# Patient Record
Sex: Female | Born: 1965
Health system: Southern US, Community
[De-identification: ages and names within clinical notes are randomized; demographics above are authoritative.]

## PROBLEM LIST (undated history)

## (undated) DIAGNOSIS — T7840XA Allergy, unspecified, initial encounter: Secondary | ICD-10-CM

## (undated) DIAGNOSIS — F411 Generalized anxiety disorder: Secondary | ICD-10-CM

## (undated) DIAGNOSIS — R5383 Other fatigue: Secondary | ICD-10-CM

## (undated) DIAGNOSIS — I10 Essential (primary) hypertension: Secondary | ICD-10-CM

## (undated) DIAGNOSIS — K589 Irritable bowel syndrome without diarrhea: Secondary | ICD-10-CM

## (undated) DIAGNOSIS — K219 Gastro-esophageal reflux disease without esophagitis: Secondary | ICD-10-CM

## (undated) DIAGNOSIS — F329 Major depressive disorder, single episode, unspecified: Secondary | ICD-10-CM

## (undated) DIAGNOSIS — G43909 Migraine, unspecified, not intractable, without status migrainosus: Secondary | ICD-10-CM

## (undated) DIAGNOSIS — H6690 Otitis media, unspecified, unspecified ear: Secondary | ICD-10-CM

## (undated) DIAGNOSIS — E785 Hyperlipidemia, unspecified: Secondary | ICD-10-CM

## (undated) DIAGNOSIS — R079 Chest pain, unspecified: Secondary | ICD-10-CM

## (undated) DIAGNOSIS — K559 Vascular disorder of intestine, unspecified: Secondary | ICD-10-CM

## (undated) DIAGNOSIS — H01009 Unspecified blepharitis unspecified eye, unspecified eyelid: Secondary | ICD-10-CM

## (undated) DIAGNOSIS — G709 Myoneural disorder, unspecified: Secondary | ICD-10-CM

## (undated) DIAGNOSIS — R5381 Other malaise: Secondary | ICD-10-CM

## (undated) DIAGNOSIS — G4710 Hypersomnia, unspecified: Secondary | ICD-10-CM

## (undated) DIAGNOSIS — E559 Vitamin D deficiency, unspecified: Secondary | ICD-10-CM

## (undated) DIAGNOSIS — R011 Cardiac murmur, unspecified: Secondary | ICD-10-CM

## (undated) DIAGNOSIS — D649 Anemia, unspecified: Secondary | ICD-10-CM

## (undated) DIAGNOSIS — K625 Hemorrhage of anus and rectum: Secondary | ICD-10-CM

## (undated) DIAGNOSIS — J309 Allergic rhinitis, unspecified: Secondary | ICD-10-CM

## (undated) DIAGNOSIS — J019 Acute sinusitis, unspecified: Secondary | ICD-10-CM

## (undated) HISTORY — DX: Allergy, unspecified, initial encounter: T78.40XA

## (undated) HISTORY — DX: Otitis media, unspecified, unspecified ear: H66.90

## (undated) HISTORY — DX: Anemia, unspecified: D64.9

## (undated) HISTORY — DX: Irritable bowel syndrome without diarrhea: K58.9

## (undated) HISTORY — DX: Gastro-esophageal reflux disease without esophagitis: K21.9

## (undated) HISTORY — DX: Other malaise: R53.81

## (undated) HISTORY — DX: Chest pain, unspecified: R07.9

## (undated) HISTORY — DX: Cardiac murmur, unspecified: R01.1

## (undated) HISTORY — DX: Acute sinusitis, unspecified: J01.90

## (undated) HISTORY — DX: Essential (primary) hypertension: I10

## (undated) HISTORY — DX: Vascular disorder of intestine, unspecified: K55.9

## (undated) HISTORY — PX: COLONOSCOPY: SHX174

## (undated) HISTORY — DX: Hypersomnia, unspecified: G47.10

## (undated) HISTORY — DX: Allergic rhinitis, unspecified: J30.9

## (undated) HISTORY — DX: Unspecified blepharitis unspecified eye, unspecified eyelid: H01.009

## (undated) HISTORY — DX: Hyperlipidemia, unspecified: E78.5

## (undated) HISTORY — DX: Major depressive disorder, single episode, unspecified: F32.9

## (undated) HISTORY — DX: Vitamin D deficiency, unspecified: E55.9

## (undated) HISTORY — DX: Myoneural disorder, unspecified: G70.9

## (undated) HISTORY — DX: Hemorrhage of anus and rectum: K62.5

## (undated) HISTORY — DX: Other fatigue: R53.83

## (undated) HISTORY — DX: Generalized anxiety disorder: F41.1

## (undated) HISTORY — DX: Migraine, unspecified, not intractable, without status migrainosus: G43.909

---

## 1999-12-07 ENCOUNTER — Encounter: Payer: Self-pay | Admitting: Internal Medicine

## 1999-12-07 ENCOUNTER — Encounter: Admission: RE | Admit: 1999-12-07 | Discharge: 1999-12-07 | Payer: Self-pay | Admitting: Internal Medicine

## 2000-11-17 ENCOUNTER — Other Ambulatory Visit: Admission: RE | Admit: 2000-11-17 | Discharge: 2000-11-17 | Payer: Self-pay | Admitting: Obstetrics and Gynecology

## 2002-02-08 ENCOUNTER — Encounter: Payer: Self-pay | Admitting: Family Medicine

## 2002-02-08 ENCOUNTER — Encounter: Admission: RE | Admit: 2002-02-08 | Discharge: 2002-02-08 | Payer: Self-pay | Admitting: Family Medicine

## 2002-08-01 ENCOUNTER — Encounter: Admission: RE | Admit: 2002-08-01 | Discharge: 2002-08-01 | Payer: Self-pay | Admitting: Obstetrics and Gynecology

## 2002-08-01 ENCOUNTER — Encounter: Payer: Self-pay | Admitting: Obstetrics and Gynecology

## 2003-01-23 ENCOUNTER — Encounter: Payer: Self-pay | Admitting: Family Medicine

## 2003-01-23 ENCOUNTER — Encounter: Admission: RE | Admit: 2003-01-23 | Discharge: 2003-01-23 | Payer: Self-pay | Admitting: Family Medicine

## 2003-03-21 ENCOUNTER — Encounter: Admission: RE | Admit: 2003-03-21 | Discharge: 2003-03-21 | Payer: Self-pay | Admitting: Family Medicine

## 2003-03-21 ENCOUNTER — Encounter: Payer: Self-pay | Admitting: Family Medicine

## 2003-06-07 ENCOUNTER — Other Ambulatory Visit: Admission: RE | Admit: 2003-06-07 | Discharge: 2003-06-07 | Payer: Self-pay | Admitting: Obstetrics and Gynecology

## 2003-11-23 HISTORY — PX: BREAST BIOPSY: SHX20

## 2004-01-24 ENCOUNTER — Encounter: Admission: RE | Admit: 2004-01-24 | Discharge: 2004-01-24 | Payer: Self-pay | Admitting: Obstetrics and Gynecology

## 2004-01-24 ENCOUNTER — Other Ambulatory Visit: Admission: RE | Admit: 2004-01-24 | Discharge: 2004-01-24 | Payer: Self-pay | Admitting: Obstetrics and Gynecology

## 2004-01-24 ENCOUNTER — Encounter (INDEPENDENT_AMBULATORY_CARE_PROVIDER_SITE_OTHER): Payer: Self-pay | Admitting: Specialist

## 2004-03-02 ENCOUNTER — Ambulatory Visit (HOSPITAL_COMMUNITY): Admission: RE | Admit: 2004-03-02 | Discharge: 2004-03-02 | Payer: Self-pay | Admitting: General Surgery

## 2004-03-02 ENCOUNTER — Ambulatory Visit (HOSPITAL_BASED_OUTPATIENT_CLINIC_OR_DEPARTMENT_OTHER): Admission: RE | Admit: 2004-03-02 | Discharge: 2004-03-02 | Payer: Self-pay | Admitting: General Surgery

## 2004-03-02 ENCOUNTER — Encounter (INDEPENDENT_AMBULATORY_CARE_PROVIDER_SITE_OTHER): Payer: Self-pay | Admitting: *Deleted

## 2005-01-28 ENCOUNTER — Other Ambulatory Visit: Admission: RE | Admit: 2005-01-28 | Discharge: 2005-01-28 | Payer: Self-pay | Admitting: Obstetrics and Gynecology

## 2005-10-29 ENCOUNTER — Encounter: Admission: RE | Admit: 2005-10-29 | Discharge: 2005-10-29 | Payer: Self-pay | Admitting: Obstetrics and Gynecology

## 2005-11-23 ENCOUNTER — Encounter: Admission: RE | Admit: 2005-11-23 | Discharge: 2005-11-23 | Payer: Self-pay | Admitting: Obstetrics and Gynecology

## 2006-02-10 ENCOUNTER — Emergency Department (HOSPITAL_COMMUNITY): Admission: EM | Admit: 2006-02-10 | Discharge: 2006-02-10 | Payer: Self-pay | Admitting: Emergency Medicine

## 2006-09-29 ENCOUNTER — Other Ambulatory Visit: Admission: RE | Admit: 2006-09-29 | Discharge: 2006-09-29 | Payer: Self-pay | Admitting: Obstetrics and Gynecology

## 2006-10-05 ENCOUNTER — Ambulatory Visit: Payer: Self-pay | Admitting: Internal Medicine

## 2006-12-27 ENCOUNTER — Ambulatory Visit: Payer: Self-pay | Admitting: Internal Medicine

## 2007-02-06 ENCOUNTER — Ambulatory Visit: Payer: Self-pay | Admitting: Internal Medicine

## 2007-03-07 ENCOUNTER — Ambulatory Visit: Payer: Self-pay | Admitting: Internal Medicine

## 2007-03-13 ENCOUNTER — Ambulatory Visit: Payer: Self-pay | Admitting: Internal Medicine

## 2007-04-05 ENCOUNTER — Ambulatory Visit: Payer: Self-pay | Admitting: Internal Medicine

## 2007-04-12 ENCOUNTER — Ambulatory Visit: Payer: Self-pay | Admitting: Internal Medicine

## 2007-06-27 ENCOUNTER — Ambulatory Visit: Payer: Self-pay | Admitting: Internal Medicine

## 2007-06-27 DIAGNOSIS — I10 Essential (primary) hypertension: Secondary | ICD-10-CM

## 2007-06-27 HISTORY — DX: Essential (primary) hypertension: I10

## 2007-06-27 LAB — CONVERTED CEMR LAB
Bilirubin Urine: NEGATIVE
Hemoglobin, Urine: NEGATIVE
Ketones, ur: NEGATIVE mg/dL
Leukocytes, UA: NEGATIVE
Nitrite: NEGATIVE
Total Protein, Urine: NEGATIVE mg/dL
Urine Glucose: NEGATIVE mg/dL
Urobilinogen, UA: 0.2 (ref 0.0–1.0)
pH: 6.5 (ref 5.0–8.0)

## 2007-09-08 ENCOUNTER — Telehealth (INDEPENDENT_AMBULATORY_CARE_PROVIDER_SITE_OTHER): Payer: Self-pay | Admitting: *Deleted

## 2007-12-04 ENCOUNTER — Telehealth (INDEPENDENT_AMBULATORY_CARE_PROVIDER_SITE_OTHER): Payer: Self-pay | Admitting: *Deleted

## 2007-12-15 ENCOUNTER — Ambulatory Visit: Payer: Self-pay | Admitting: Internal Medicine

## 2007-12-15 DIAGNOSIS — F329 Major depressive disorder, single episode, unspecified: Secondary | ICD-10-CM

## 2007-12-15 DIAGNOSIS — F411 Generalized anxiety disorder: Secondary | ICD-10-CM | POA: Insufficient documentation

## 2007-12-15 DIAGNOSIS — J309 Allergic rhinitis, unspecified: Secondary | ICD-10-CM

## 2007-12-15 DIAGNOSIS — F32A Depression, unspecified: Secondary | ICD-10-CM | POA: Insufficient documentation

## 2007-12-15 DIAGNOSIS — K219 Gastro-esophageal reflux disease without esophagitis: Secondary | ICD-10-CM | POA: Insufficient documentation

## 2007-12-15 DIAGNOSIS — F3289 Other specified depressive episodes: Secondary | ICD-10-CM

## 2007-12-15 HISTORY — DX: Allergic rhinitis, unspecified: J30.9

## 2007-12-15 HISTORY — DX: Gastro-esophageal reflux disease without esophagitis: K21.9

## 2007-12-15 HISTORY — DX: Major depressive disorder, single episode, unspecified: F32.9

## 2007-12-15 HISTORY — DX: Other specified depressive episodes: F32.89

## 2007-12-15 HISTORY — DX: Generalized anxiety disorder: F41.1

## 2007-12-18 ENCOUNTER — Telehealth (INDEPENDENT_AMBULATORY_CARE_PROVIDER_SITE_OTHER): Payer: Self-pay | Admitting: *Deleted

## 2007-12-19 ENCOUNTER — Telehealth (INDEPENDENT_AMBULATORY_CARE_PROVIDER_SITE_OTHER): Payer: Self-pay | Admitting: *Deleted

## 2007-12-24 HISTORY — PX: UPPER GASTROINTESTINAL ENDOSCOPY: SHX188

## 2007-12-27 ENCOUNTER — Ambulatory Visit: Payer: Self-pay | Admitting: Gastroenterology

## 2007-12-28 ENCOUNTER — Ambulatory Visit: Payer: Self-pay | Admitting: Gastroenterology

## 2007-12-30 ENCOUNTER — Ambulatory Visit: Payer: Self-pay | Admitting: Family Medicine

## 2007-12-30 DIAGNOSIS — J019 Acute sinusitis, unspecified: Secondary | ICD-10-CM | POA: Insufficient documentation

## 2007-12-30 HISTORY — DX: Acute sinusitis, unspecified: J01.90

## 2008-01-01 ENCOUNTER — Telehealth (INDEPENDENT_AMBULATORY_CARE_PROVIDER_SITE_OTHER): Payer: Self-pay | Admitting: *Deleted

## 2008-01-02 ENCOUNTER — Encounter: Payer: Self-pay | Admitting: Internal Medicine

## 2008-01-02 ENCOUNTER — Ambulatory Visit: Payer: Self-pay | Admitting: Gastroenterology

## 2008-01-16 ENCOUNTER — Ambulatory Visit: Payer: Self-pay | Admitting: Internal Medicine

## 2008-01-23 ENCOUNTER — Ambulatory Visit: Payer: Self-pay | Admitting: Gastroenterology

## 2008-04-03 ENCOUNTER — Ambulatory Visit: Payer: Self-pay | Admitting: Internal Medicine

## 2008-04-03 DIAGNOSIS — H01009 Unspecified blepharitis unspecified eye, unspecified eyelid: Secondary | ICD-10-CM

## 2008-04-03 HISTORY — DX: Unspecified blepharitis unspecified eye, unspecified eyelid: H01.009

## 2008-04-09 ENCOUNTER — Telehealth (INDEPENDENT_AMBULATORY_CARE_PROVIDER_SITE_OTHER): Payer: Self-pay | Admitting: *Deleted

## 2008-04-10 ENCOUNTER — Telehealth (INDEPENDENT_AMBULATORY_CARE_PROVIDER_SITE_OTHER): Payer: Self-pay | Admitting: *Deleted

## 2008-04-10 ENCOUNTER — Encounter: Payer: Self-pay | Admitting: Internal Medicine

## 2008-04-25 ENCOUNTER — Encounter: Admission: RE | Admit: 2008-04-25 | Discharge: 2008-04-25 | Payer: Self-pay | Admitting: Obstetrics and Gynecology

## 2008-09-12 ENCOUNTER — Ambulatory Visit: Payer: Self-pay | Admitting: Internal Medicine

## 2008-09-12 DIAGNOSIS — H6690 Otitis media, unspecified, unspecified ear: Secondary | ICD-10-CM | POA: Insufficient documentation

## 2008-09-12 HISTORY — DX: Otitis media, unspecified, unspecified ear: H66.90

## 2008-09-16 ENCOUNTER — Telehealth (INDEPENDENT_AMBULATORY_CARE_PROVIDER_SITE_OTHER): Payer: Self-pay | Admitting: *Deleted

## 2008-12-23 ENCOUNTER — Ambulatory Visit: Payer: Self-pay | Admitting: Internal Medicine

## 2008-12-23 DIAGNOSIS — G43009 Migraine without aura, not intractable, without status migrainosus: Secondary | ICD-10-CM | POA: Insufficient documentation

## 2008-12-23 DIAGNOSIS — G43909 Migraine, unspecified, not intractable, without status migrainosus: Secondary | ICD-10-CM

## 2008-12-23 HISTORY — DX: Migraine, unspecified, not intractable, without status migrainosus: G43.909

## 2009-01-13 ENCOUNTER — Telehealth (INDEPENDENT_AMBULATORY_CARE_PROVIDER_SITE_OTHER): Payer: Self-pay | Admitting: *Deleted

## 2009-01-14 ENCOUNTER — Telehealth (INDEPENDENT_AMBULATORY_CARE_PROVIDER_SITE_OTHER): Payer: Self-pay | Admitting: *Deleted

## 2009-01-27 ENCOUNTER — Telehealth (INDEPENDENT_AMBULATORY_CARE_PROVIDER_SITE_OTHER): Payer: Self-pay | Admitting: *Deleted

## 2009-02-12 ENCOUNTER — Encounter: Admission: RE | Admit: 2009-02-12 | Discharge: 2009-02-12 | Payer: Self-pay | Admitting: Otolaryngology

## 2009-04-28 ENCOUNTER — Telehealth: Payer: Self-pay | Admitting: Gastroenterology

## 2009-04-28 ENCOUNTER — Ambulatory Visit: Payer: Self-pay | Admitting: Gastroenterology

## 2009-04-28 DIAGNOSIS — K559 Vascular disorder of intestine, unspecified: Secondary | ICD-10-CM

## 2009-04-28 DIAGNOSIS — K625 Hemorrhage of anus and rectum: Secondary | ICD-10-CM | POA: Insufficient documentation

## 2009-04-28 DIAGNOSIS — K589 Irritable bowel syndrome without diarrhea: Secondary | ICD-10-CM | POA: Insufficient documentation

## 2009-04-28 HISTORY — DX: Hemorrhage of anus and rectum: K62.5

## 2009-04-28 HISTORY — DX: Irritable bowel syndrome, unspecified: K58.9

## 2009-04-28 HISTORY — DX: Vascular disorder of intestine, unspecified: K55.9

## 2009-04-30 LAB — CONVERTED CEMR LAB
ALT: 14 units/L (ref 0–35)
AST: 17 units/L (ref 0–37)
Albumin: 3.3 g/dL — ABNORMAL LOW (ref 3.5–5.2)
Alkaline Phosphatase: 112 units/L (ref 39–117)
Basophils Absolute: 0 10*3/uL (ref 0.0–0.1)
Calcium: 8.7 mg/dL (ref 8.4–10.5)
Chloride: 111 meq/L (ref 96–112)
Eosinophils Absolute: 0 10*3/uL (ref 0.0–0.7)
Lymphocytes Relative: 37.2 % (ref 12.0–46.0)
MCHC: 33.5 g/dL (ref 30.0–36.0)
Monocytes Absolute: 0.5 10*3/uL (ref 0.1–1.0)
Neutrophils Relative %: 55 % (ref 43.0–77.0)
Platelets: 258 10*3/uL (ref 150.0–400.0)
Potassium: 3.9 meq/L (ref 3.5–5.1)
RDW: 16.5 % — ABNORMAL HIGH (ref 11.5–14.6)

## 2009-05-02 ENCOUNTER — Encounter (INDEPENDENT_AMBULATORY_CARE_PROVIDER_SITE_OTHER): Payer: Self-pay | Admitting: *Deleted

## 2009-05-02 ENCOUNTER — Encounter: Payer: Self-pay | Admitting: Gastroenterology

## 2009-05-02 ENCOUNTER — Ambulatory Visit: Payer: Self-pay | Admitting: Gastroenterology

## 2009-05-02 LAB — HM COLONOSCOPY

## 2009-05-07 ENCOUNTER — Telehealth: Payer: Self-pay | Admitting: Gastroenterology

## 2009-05-07 ENCOUNTER — Encounter: Payer: Self-pay | Admitting: Gastroenterology

## 2009-05-08 ENCOUNTER — Telehealth: Payer: Self-pay | Admitting: Gastroenterology

## 2009-05-21 ENCOUNTER — Ambulatory Visit: Payer: Self-pay | Admitting: Internal Medicine

## 2009-05-28 ENCOUNTER — Ambulatory Visit: Payer: Self-pay | Admitting: Internal Medicine

## 2009-05-28 DIAGNOSIS — M25519 Pain in unspecified shoulder: Secondary | ICD-10-CM | POA: Insufficient documentation

## 2009-07-08 ENCOUNTER — Ambulatory Visit: Payer: Self-pay | Admitting: Gastroenterology

## 2009-08-05 ENCOUNTER — Telehealth: Payer: Self-pay | Admitting: Internal Medicine

## 2009-09-09 ENCOUNTER — Telehealth: Payer: Self-pay | Admitting: Internal Medicine

## 2009-12-01 ENCOUNTER — Ambulatory Visit: Payer: Self-pay | Admitting: Internal Medicine

## 2009-12-19 ENCOUNTER — Ambulatory Visit: Payer: Self-pay | Admitting: Internal Medicine

## 2010-01-22 ENCOUNTER — Encounter: Admission: RE | Admit: 2010-01-22 | Discharge: 2010-01-22 | Payer: Self-pay | Admitting: Obstetrics and Gynecology

## 2010-02-17 ENCOUNTER — Ambulatory Visit: Payer: Self-pay | Admitting: Internal Medicine

## 2010-03-30 ENCOUNTER — Ambulatory Visit: Payer: Self-pay | Admitting: Internal Medicine

## 2010-03-30 DIAGNOSIS — H1030 Unspecified acute conjunctivitis, unspecified eye: Secondary | ICD-10-CM | POA: Insufficient documentation

## 2010-04-10 ENCOUNTER — Ambulatory Visit: Payer: Self-pay | Admitting: Internal Medicine

## 2010-06-25 ENCOUNTER — Ambulatory Visit: Payer: Self-pay | Admitting: Internal Medicine

## 2010-06-29 ENCOUNTER — Telehealth: Payer: Self-pay | Admitting: Internal Medicine

## 2010-06-29 LAB — CONVERTED CEMR LAB
ALT: 13 units/L (ref 0–35)
BUN: 8 mg/dL (ref 6–23)
Basophils Absolute: 0.1 10*3/uL (ref 0.0–0.1)
Bilirubin Urine: NEGATIVE
Bilirubin, Direct: 0.1 mg/dL (ref 0.0–0.3)
Chloride: 105 meq/L (ref 96–112)
Cholesterol: 259 mg/dL — ABNORMAL HIGH (ref 0–200)
Creatinine, Ser: 0.7 mg/dL (ref 0.4–1.2)
Eosinophils Absolute: 0.1 10*3/uL (ref 0.0–0.7)
Eosinophils Relative: 1 % (ref 0.0–5.0)
GFR calc non Af Amer: 114.59 mL/min (ref >60)
Glucose, Bld: 82 mg/dL (ref 70–99)
HCT: 34.7 % — ABNORMAL LOW (ref 36.0–46.0)
HDL: 41.2 mg/dL (ref >39.00)
Hemoglobin, Urine: NEGATIVE
Ketones, ur: NEGATIVE mg/dL
Leukocytes, UA: NEGATIVE
Lymphs Abs: 3.6 10*3/uL (ref 0.7–4.0)
MCV: 76.7 fL — ABNORMAL LOW (ref 78.0–100.0)
Monocytes Absolute: 0.5 10*3/uL (ref 0.1–1.0)
Neutrophils Relative %: 45.5 % (ref 43.0–77.0)
Platelets: 282 10*3/uL (ref 150.0–400.0)
RDW: 16.6 % — ABNORMAL HIGH (ref 11.5–14.6)
Specific Gravity, Urine: 1.015 (ref 1.000–1.030)
TSH: 1.92 microintl units/mL (ref 0.35–5.50)
Total Bilirubin: 0.4 mg/dL (ref 0.3–1.2)
Total CHOL/HDL Ratio: 6
Triglycerides: 110 mg/dL (ref 0.0–149.0)
Urine Glucose: NEGATIVE mg/dL
Urobilinogen, UA: 0.2 (ref 0.0–1.0)
VLDL: 22 mg/dL (ref 0.0–40.0)
WBC: 7.9 10*3/uL (ref 4.5–10.5)

## 2010-07-03 ENCOUNTER — Ambulatory Visit: Payer: Self-pay | Admitting: Internal Medicine

## 2010-07-03 DIAGNOSIS — E785 Hyperlipidemia, unspecified: Secondary | ICD-10-CM

## 2010-07-03 DIAGNOSIS — M79609 Pain in unspecified limb: Secondary | ICD-10-CM | POA: Insufficient documentation

## 2010-07-03 HISTORY — DX: Hyperlipidemia, unspecified: E78.5

## 2010-08-05 ENCOUNTER — Telehealth: Payer: Self-pay | Admitting: Internal Medicine

## 2010-08-20 ENCOUNTER — Telehealth (INDEPENDENT_AMBULATORY_CARE_PROVIDER_SITE_OTHER): Payer: Self-pay

## 2010-08-25 ENCOUNTER — Ambulatory Visit: Payer: Self-pay | Admitting: Internal Medicine

## 2010-08-25 ENCOUNTER — Encounter: Payer: Self-pay | Admitting: Internal Medicine

## 2010-08-25 DIAGNOSIS — R5381 Other malaise: Secondary | ICD-10-CM

## 2010-08-25 DIAGNOSIS — R079 Chest pain, unspecified: Secondary | ICD-10-CM

## 2010-08-25 DIAGNOSIS — R5383 Other fatigue: Secondary | ICD-10-CM

## 2010-08-25 HISTORY — DX: Chest pain, unspecified: R07.9

## 2010-08-25 HISTORY — DX: Other malaise: R53.81

## 2010-08-25 LAB — CONVERTED CEMR LAB
Alkaline Phosphatase: 121 units/L — ABNORMAL HIGH (ref 39–117)
Bilirubin, Direct: 0.1 mg/dL (ref 0.0–0.3)
Cholesterol: 166 mg/dL (ref 0–200)
LDL Cholesterol: 98 mg/dL (ref 0–99)
Total CHOL/HDL Ratio: 3
Total Protein: 7.2 g/dL (ref 6.0–8.3)
VLDL: 18.8 mg/dL (ref 0.0–40.0)

## 2010-12-18 ENCOUNTER — Ambulatory Visit
Admission: RE | Admit: 2010-12-18 | Discharge: 2010-12-18 | Payer: Self-pay | Source: Home / Self Care | Attending: Internal Medicine | Admitting: Internal Medicine

## 2010-12-18 ENCOUNTER — Encounter (INDEPENDENT_AMBULATORY_CARE_PROVIDER_SITE_OTHER): Payer: Self-pay | Admitting: *Deleted

## 2010-12-18 ENCOUNTER — Other Ambulatory Visit: Payer: Self-pay | Admitting: Internal Medicine

## 2010-12-18 DIAGNOSIS — G4710 Hypersomnia, unspecified: Secondary | ICD-10-CM

## 2010-12-18 DIAGNOSIS — D649 Anemia, unspecified: Secondary | ICD-10-CM | POA: Insufficient documentation

## 2010-12-18 HISTORY — DX: Anemia, unspecified: D64.9

## 2010-12-18 HISTORY — DX: Hypersomnia, unspecified: G47.10

## 2010-12-18 LAB — CBC WITH DIFFERENTIAL/PLATELET
Basophils Absolute: 0.1 10*3/uL (ref 0.0–0.1)
Eosinophils Absolute: 0.1 10*3/uL (ref 0.0–0.7)
Hemoglobin: 11.2 g/dL — ABNORMAL LOW (ref 12.0–15.0)
Lymphocytes Relative: 42.6 % (ref 12.0–46.0)
MCHC: 32.3 g/dL (ref 30.0–36.0)
Neutro Abs: 4.5 10*3/uL (ref 1.4–7.7)
RDW: 16.5 % — ABNORMAL HIGH (ref 11.5–14.6)

## 2010-12-18 LAB — IBC PANEL
Iron: 52 ug/dL (ref 42–145)
Transferrin: 352.6 mg/dL (ref 212.0–360.0)

## 2010-12-24 NOTE — Assessment & Plan Note (Signed)
Summary: SINUS/NWS   Vital Signs:  Patient profile:   45 year old female Height:      54 inches Weight:      163 pounds BMI:     39.44 O2 Sat:      99 % on Room air Temp:     99.1 degrees F oral Pulse rate:   88 / minute BP sitting:   152 / 96  (left arm) Cuff size:   regular  Vitals Entered ByMarland Kitchen Zella Ball Ewing (December 01, 2009 9:37 AM)  O2 Flow:  Room air CC: sinus pressure and congestion/RE   Primary Care Provider:  Oliver Barre, MD  CC:  sinus pressure and congestion/RE.  History of Present Illness: here with 3 days acute onset facial pain, pressure, fever and congestion; has some mild ST, but Pt denies CP, sob, doe, wheezing, orthopnea, pnd, worsening LE edema, palps, dizziness or syncope   Pt denies new neuro symptoms such as headache, facial or extremity weakness   Recently started on allergy med - allegra , good compliance, and tolerating meds well.  No earache or vertigo.    Problems Prior to Update: 1)  Shoulder Pain, Right  (ICD-719.41) 2)  Irritable Bowel Syndrome  (ICD-564.1) 3)  Gerd  (ICD-530.81) 4)  Rectal Bleeding  (ICD-569.3) 5)  Rectal Bleeding  (ICD-569.3) 6)  Ischemic Colitis  (ICD-557.9) 7)  Common Migraine  (ICD-346.10) 8)  Sinusitis- Acute-nos  (ICD-461.9) 9)  Otitis Media, Bilateral  (ICD-382.9) 10)  Blepharitis, Right  (ICD-373.00) 11)  Acute Sinusitis, Unspecified  (ICD-461.9) 12)  Allergic Rhinitis  (ICD-477.9) 13)  Depression  (ICD-311) 14)  Anxiety  (ICD-300.00) 15)  Gerd  (ICD-530.81) 16)  Hypertension  (ICD-401.9)  Medications Prior to Update: 1)  Lisinopril 10 Mg Tabs (Lisinopril) .... Take 1 Tablet By Mouth Once A Day 2)  Nexium 40 Mg  Cpdr (Esomeprazole Magnesium) .Marland Kitchen.. 1 By Mouth Qd 3)  Nasacort Aq 55 Mcg/act  Aers (Triamcinolone Acetonide(Nasal)) .Marland Kitchen.. 1 Spray Each Nostril 1 Qd 4)  Zyrtec Allergy 10 Mg  Tabs (Cetirizine Hcl) .Marland Kitchen.. 1 By Mouth Once Daily Prn 5)  Transderm-Scop 1.5 Mg Pt72 (Scopolamine Base) .... Use Asd 1 Patch Q 3 Days As  Needed  Current Medications (verified): 1)  Lisinopril 10 Mg Tabs (Lisinopril) .... Take 1 Tablet By Mouth Once A Day 2)  Nexium 40 Mg  Cpdr (Esomeprazole Magnesium) .Marland Kitchen.. 1 By Mouth Qd 3)  Nasacort Aq 55 Mcg/act  Aers (Triamcinolone Acetonide(Nasal)) .Marland Kitchen.. 1 Spray Each Nostril 1 Qd 4)  Zyrtec Allergy 10 Mg  Tabs (Cetirizine Hcl) .Marland Kitchen.. 1 By Mouth Once Daily Prn 5)  Transderm-Scop 1.5 Mg Pt72 (Scopolamine Base) .... Use Asd 1 Patch Q 3 Days As Needed 6)  Fexofenadine Hcl 180 Mg Tabs (Fexofenadine Hcl) .Marland Kitchen.. 1 By Mouth Once Daily 7)  Cephalexin 500 Mg Caps (Cephalexin) .Marland Kitchen.. 1 By Mouth Three Times A Day  Allergies (verified): 1)  Biaxin 2)  Augmentin 3)  Doxycycline  Past History:  Past Medical History: Last updated: 05/28/2009 Hypertension GERD migraine Anxiety Depression Allergic rhinitis IBS/DIARRHEA PREDOMINANT ischemic colitis june 2010  Past Surgical History: Last updated: 12/15/2007 Breast Biopsy- 2005 - neg  Social History: Last updated: 04/28/2009 Current Smoker 6 ciggs a day Alcohol use-no Single 1 child Daily Caffeine Use 2 cups a day Illicit Drug Use - no Patient does not get regular exercise.   Risk Factors: Exercise: no (04/28/2009)  Risk Factors: Smoking Status: current (12/15/2007) Packs/Day: 1 PPD (06/27/2007)  Review of Systems  all otherwise negative per pt -  Physical Exam  General:  alert and overweight-appearing. , mild ill  Head:  normocephalic and atraumatic.   Eyes:  vision grossly intact, pupils equal, and pupils round.   Ears:  bilat tm's red, sinus tender bilat Nose:  nasal dischargemucosal pallor and mucosal erythema.   Mouth:  pharyngeal erythema and fair dentition.   Neck:  supple and cervical lymphadenopathy.   Lungs:  normal respiratory effort and normal breath sounds.   Heart:  normal rate and regular rhythm.   Extremities:  no edema, no erythema    Impression & Recommendations:  Problem # 1:  SINUSITIS- ACUTE-NOS  (ICD-461.9)  Her updated medication list for this problem includes:    Nasacort Aq 55 Mcg/act Aers (Triamcinolone acetonide(nasal)) .Marland Kitchen... 1 spray each nostril 1 qd    Cephalexin 500 Mg Caps (Cephalexin) .Marland Kitchen... 1 by mouth three times a day treat as above, f/u any worsening signs or symptoms   Problem # 2:  HYPERTENSION (ICD-401.9)  Her updated medication list for this problem includes:    Lisinopril 10 Mg Tabs (Lisinopril) .Marland Kitchen... Take 1 tablet by mouth once a day  BP today: 152/96 Prior BP: 146/88 (07/08/2009)  Labs Reviewed: K+: 3.9 (04/28/2009) Creat: : 0.6 (04/28/2009)    mild elev today, likely situational, ok to follow, continue same treatment   Problem # 3:  ALLERGIC RHINITIS (ICD-477.9)  The following medications were removed from the medication list:    Zyrtec Allergy 10 Mg Tabs (Cetirizine hcl) .Marland Kitchen... 1 by mouth once daily prn Her updated medication list for this problem includes:    Nasacort Aq 55 Mcg/act Aers (Triamcinolone acetonide(nasal)) .Marland Kitchen... 1 spray each nostril 1 qd    Fexofenadine Hcl 180 Mg Tabs (Fexofenadine hcl) .Marland Kitchen... 1 by mouth once daily stable overall by hx and exam, ok to continue meds/tx as is   Complete Medication List: 1)  Lisinopril 10 Mg Tabs (Lisinopril) .... Take 1 tablet by mouth once a day 2)  Nexium 40 Mg Cpdr (Esomeprazole magnesium) .Marland Kitchen.. 1 by mouth qd 3)  Nasacort Aq 55 Mcg/act Aers (Triamcinolone acetonide(nasal)) .Marland Kitchen.. 1 spray each nostril 1 qd 4)  Transderm-scop 1.5 Mg Pt72 (Scopolamine base) .... Use asd 1 patch q 3 days as needed 5)  Fexofenadine Hcl 180 Mg Tabs (Fexofenadine hcl) .Marland Kitchen.. 1 by mouth once daily 6)  Cephalexin 500 Mg Caps (Cephalexin) .Marland Kitchen.. 1 by mouth three times a day  Patient Instructions: 1)  Please take all new medications as prescribed 2)  Continue all previous medications as before this visit  3)  Please schedule a follow-up appointment in 2 months with CPX labs 4)  Check your Blood Pressure regularly. Your goal is to be  less than 140/90 on average Prescriptions: CEPHALEXIN 500 MG CAPS (CEPHALEXIN) 1 by mouth three times a day  #30 x 0   Entered and Authorized by:   Corwin Levins MD   Signed by:   Corwin Levins MD on 12/01/2009   Method used:   Print then Give to Patient   RxID:   [id number]

## 2010-12-24 NOTE — Assessment & Plan Note (Signed)
Summary: ??sinus inf/cd   Vital Signs:  Patient profile:   45 year old female Height:      62 inches Weight:      167.25 pounds BMI:     30.70 O2 Sat:      99 % on Room air Temp:     98.4 degrees F oral Pulse rate:   85 / minute BP sitting:   102 / 72  (left arm)  Vitals Entered By: Zella Ball Ewing CMA (AAMA) (December 18, 2010 8:04 AM)  O2 Flow:  Room air CC: Sinus congestion, fatigue/RE   Primary Care Provider:  Oliver Barre, MD  CC:  Sinus congestion and fatigue/RE.  History of Present Illness: here with c/o midl to mod 3 days headache, facial pain, pressure, fever,  and greenih  d/c but no ST, cough except at the start now improved, and Pt denies CP, worsening sob, doe, wheezing, orthopnea, pnd, worsening LE edema, palps, dizziness or syncope  .  Pt denies new neuro symptoms such as headache, facial or extremity weakness Pt denies polydipsia, polyuria  Overall good compliance with meds, trying to follow low chol diet, wt stable, little excercise however .  C/o fatigue even before recent statin use, tends to have difficulty sleepin g at night,  Denies worsening depressive symptoms, suicidal ideation, or panic, htough may have some ongoin anxiety.   2-3 nights per wk has diffictuly sleeping,  gets about 5 hrs per night or less; tneds to stay up later, does not wake up early;  not usualy wakes up with headaches, often has nonrestorative sleep,  tends to want to fall asleep most days about 2 to 3 in the afternoon daily, and will nap quite a bit on sat and sun except for going to church on sunday most weekends.  No falling asleep at work or on the highway.  Lives alone, sone is at college, but son did say she snores with sleep, not sure about apnea spells.   has seen allergy in the past , has a nasal steroid from Stevphen Rochester MD but cant remember the name and only uses rarely when she has more sinus discomfort rather than just congesiton.  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    Problems Prior to Update: 1)  Chest Pain  (ICD-786.50) 2)  Hepatotoxicity, Drug-induced, Risk of  (ICD-V58.69) 3)  Fatigue  (ICD-780.79) 4)  Hyperlipidemia  (ICD-272.4) 5)  Foot Pain, Right  (ICD-729.5) 6)  Preventive Health Care  (ICD-V70.0) 7)  Sinusitis- Acute-nos  (ICD-461.9) 8)  Conjunctivitis, Acute, Right  (ICD-372.00) 9)  Sinusitis- Acute-nos  (ICD-461.9) 10)  Shoulder Pain, Right  (ICD-719.41) 11)  Irritable Bowel Syndrome  (ICD-564.1) 12)  Gerd  (ICD-530.81) 13)  Rectal Bleeding  (ICD-569.3) 14)  Rectal Bleeding  (ICD-569.3) 15)  Ischemic Colitis  (ICD-557.9) 16)  Common Migraine  (ICD-346.10) 17)  Sinusitis- Acute-nos  (ICD-461.9) 18)  Otitis Media, Bilateral  (ICD-382.9) 19)  Blepharitis, Right  (ICD-373.00) 20)  Acute Sinusitis, Unspecified  (ICD-461.9) 21)  Allergic Rhinitis  (ICD-477.9) 22)  Depression  (ICD-311) 23)  Anxiety  (ICD-300.00) 24)  Gerd  (ICD-530.81) 25)  Hypertension  (ICD-401.9)  Medications Prior to Update: 1)  Lisinopril 40 Mg Tabs (Lisinopril) .Marland Kitchen.. 1 By Mouth Once Daily 2)  Nexium 40 Mg  Cpdr (Esomeprazole Magnesium) .Marland Kitchen.. 1 By Mouth Once Daily 3)  Nasacort Aq 55 Mcg/act  Aers (Triamcinolone  Acetonide(Nasal)) .Marland Kitchen.. 1 Spray Each Nostril  Once Daily 4)  Fexofenadine Hcl 180 Mg Tabs (Fexofenadine Hcl) .Marland Kitchen.. 1 By Mouth Once Daily 5)  Robaxin-750 750 Mg Tabs (Methocarbamol) .Marland Kitchen.. 1 By Mouth Three Times A Day As Needed For Muscle Spasm and Pain 6)  Azithromycin 250 Mg Tabs (Azithromycin) .... 2po Qd For 1 Day, Then 1po Qd For 4days, Then Stop 7)  Simvastatin 20 Mg Tabs (Simvastatin) .Marland Kitchen.. 1po Once Daily 8)  Carafate 1 Gm/40ml Susp (Sucralfate) .Marland Kitchen.. 1 Gm Three Times A Day, 30 Minutes Before Meals  Current Medications (verified): 1)  Lisinopril 40 Mg Tabs (Lisinopril) .Marland Kitchen.. 1 By Mouth Once Daily 2)  Nexium 40 Mg  Cpdr (Esomeprazole Magnesium) .Marland Kitchen.. 1 By Mouth Once Daily 3)   Nasacort Aq 55 Mcg/act  Aers (Triamcinolone Acetonide(Nasal)) .Marland Kitchen.. 1 Spray Each Nostril  Once Daily 4)  Fexofenadine Hcl 180 Mg Tabs (Fexofenadine Hcl) .Marland Kitchen.. 1 By Mouth Once Daily 5)  Robaxin-750 750 Mg Tabs (Methocarbamol) .Marland Kitchen.. 1 By Mouth Three Times A Day As Needed For Muscle Spasm and Pain 6)  Levofloxacin 500 Mg Tabs (Levofloxacin) .Marland Kitchen.. 1po Once Daily 7)  Simvastatin 20 Mg Tabs (Simvastatin) .Marland Kitchen.. 1po Once Daily 8)  Carafate 1 Gm/81ml Susp (Sucralfate) .Marland Kitchen.. 1 Gm Three Times A Day, 30 Minutes Before Meals  Allergies (verified): 1)  Biaxin 2)  Augmentin 3)  Doxycycline  Past History:  Past Surgical History: Last updated: 12/15/2007 Breast Biopsy- 2005 - neg  Social History: Last updated: 12/18/2010 Current Smoker 6 ciggs a day Alcohol use-no Single 1 child Daily Caffeine Use 2 cups a day Illicit Drug Use - no Patient does not get regular exercise.  Drug use-no  Risk Factors: Exercise: no (04/28/2009)  Risk Factors: Smoking Status: current (12/15/2007) Packs/Day: 1 PPD (06/27/2007)  Past Medical History: Hypertension GERD migraine Anxiety Depression Allergic rhinitis IBS/DIARRHEA PREDOMINANT ischemic colitis june 2010   Hyperlipidemia Anemia-NOS  Social History: Current Smoker 6 ciggs a day Alcohol use-no Single 1 child Daily Caffeine Use 2 cups a day Illicit Drug Use - no Patient does not get regular exercise.  Drug use-no  Review of Systems       all otherwise negative per pt -    Physical Exam  General:  alert and overweight-appearing.  . mild ill  Head:  normocephalic and atraumatic.   Eyes:  vision grossly intact, pupils equal, and pupils round.   Ears:  bialt tms' mild red, sinus tender bilat Nose:  nasal dischargemucosal pallor and mucosal edema.   Mouth:  pharyngeal erythema and fair dentition.   Neck:  supple and no masses.   Lungs:  normal respiratory effort and normal breath sounds.   Heart:  normal rate and regular rhythm.    Extremities:  no edema, no erythema  Skin:  color normal and no rashes.   Psych:  not anxious appearing and not depressed appearing.     Impression & Recommendations:  Problem # 1:  SINUSITIS- ACUTE-NOS (ICD-461.9)  Her updated medication list for this problem includes:    Nasacort Aq 55 Mcg/act Aers (Triamcinolone acetonide(nasal)) .Marland Kitchen... 1 spray each nostril  once daily    Levofloxacin 500 Mg Tabs (Levofloxacin) .Marland Kitchen... 1po once daily treat as above, f/u any worsening signs or symptoms , overall mild to mod  Problem # 2:  ALLERGIC RHINITIS (ICD-477.9)  Her updated medication list for this problem includes:    Nasacort Aq 55 Mcg/act Aers (Triamcinolone acetonide(nasal)) .Marland Kitchen... 1 spray each nostril  once daily  Fexofenadine Hcl 180 Mg Tabs (Fexofenadine hcl) .Marland Kitchen... 1 by mouth once daily urged compliance with meds - treat as above, f/u any worsening signs or symptoms   Problem # 3:  FATIGUE (ICD-780.79) exam o/w benign, ? componenent of sleep hygeine or apnea ; follow with expectant management , cont same meds for now, does not appear to have significant psych component;  refer to pulm to r/o osa  Problem # 4:  ANEMIA-NOS (ICD-285.9)  for f/u labs today - for iron/cbc  Orders: TLB-IBC Pnl (Iron/FE;Transferrin) (83550-IBC) TLB-CBC Platelet - w/Differential (85025-CBCD)  Complete Medication List: 1)  Lisinopril 40 Mg Tabs (Lisinopril) .Marland Kitchen.. 1 by mouth once daily 2)  Nexium 40 Mg Cpdr (Esomeprazole magnesium) .Marland Kitchen.. 1 by mouth once daily 3)  Nasacort Aq 55 Mcg/act Aers (Triamcinolone acetonide(nasal)) .Marland Kitchen.. 1 spray each nostril  once daily 4)  Fexofenadine Hcl 180 Mg Tabs (Fexofenadine hcl) .Marland Kitchen.. 1 by mouth once daily 5)  Robaxin-750 750 Mg Tabs (Methocarbamol) .Marland Kitchen.. 1 by mouth three times a day as needed for muscle spasm and pain 6)  Levofloxacin 500 Mg Tabs (Levofloxacin) .Marland Kitchen.. 1po once daily 7)  Simvastatin 20 Mg Tabs (Simvastatin) .Marland Kitchen.. 1po once daily 8)  Carafate 1 Gm/79ml Susp  (Sucralfate) .Marland Kitchen.. 1 gm three times a day, 30 minutes before meals  Other Orders: Pulmonary Referral (Pulmonary)  Patient Instructions: 1)  Please take all new medications as prescribed 2)  Continue all previous medications as before this visit, including the allergy meds 3)  Please go to the Lab in the basement for your blood and/or urine tests today 4)  Please call the number on the [facility] Card for results of your testing 5)  You will be contacted about the referral(s) to: pulmonary 6)  Please schedule a follow-up appointment in August 2012 for CPX with labs Prescriptions: LEVOFLOXACIN 500 MG TABS (LEVOFLOXACIN) 1po once daily  #10 x 0   Entered and Authorized by:   Corwin Levins MD   Signed by:   Corwin Levins MD on 12/18/2010   Method used:   Print then Give to Patient   RxID:   [id number]    Orders Added: 1)  TLB-IBC Pnl (Iron/FE;Transferrin) [83550-IBC] 2)  TLB-CBC Platelet - w/Differential [85025-CBCD] 3)  Pulmonary Referral [Pulmonary] 4)  Est. Patient Level IV [56213]

## 2010-12-24 NOTE — Assessment & Plan Note (Signed)
Summary: EYE HURTING--STC   Vital Signs:  Patient profile:   45 year old female Height:      62 inches Weight:      165 pounds BMI:     30.29 O2 Sat:      97 % on Room air Temp:     98.4 degrees F oral Pulse rate:   72 / minute BP sitting:   144 / 90  (left arm) Cuff size:   regular  Vitals Entered ByZella Ball Ewing (Mar 30, 2010 11:43 AM)  O2 Flow:  Room air CC: Right eye pain, swollen, drainage for 3 days/RE   Primary Care Provider:  Oliver Barre, MD  CC:  Right eye pain, swollen, and drainage for 3 days/RE.  History of Present Illness: here with acute onset 3 days mild to mod facial pain, pressure, fever and greenish d/c, as well as right eye discomfort with d/c and lower lip pain and swelling she has never had in the past; no trauma or chills;  no headache, ST , cough , wheezing and Pt denies CP, sob, doe, wheezing, orthopnea, pnd, worsening LE edema, palps, dizziness or syncope     Problems Prior to Update: 1)  Conjunctivitis, Acute, Right  (ICD-372.00) 2)  Sinusitis- Acute-nos  (ICD-461.9) 3)  Shoulder Pain, Right  (ICD-719.41) 4)  Irritable Bowel Syndrome  (ICD-564.1) 5)  Gerd  (ICD-530.81) 6)  Rectal Bleeding  (ICD-569.3) 7)  Rectal Bleeding  (ICD-569.3) 8)  Ischemic Colitis  (ICD-557.9) 9)  Common Migraine  (ICD-346.10) 10)  Sinusitis- Acute-nos  (ICD-461.9) 11)  Otitis Media, Bilateral  (ICD-382.9) 12)  Blepharitis, Right  (ICD-373.00) 13)  Acute Sinusitis, Unspecified  (ICD-461.9) 14)  Allergic Rhinitis  (ICD-477.9) 15)  Depression  (ICD-311) 16)  Anxiety  (ICD-300.00) 17)  Gerd  (ICD-530.81) 18)  Hypertension  (ICD-401.9)  Medications Prior to Update: 1)  Lisinopril 40 Mg Tabs (Lisinopril) .Marland Kitchen.. 1 By Mouth Once Daily 2)  Nexium 40 Mg  Cpdr (Esomeprazole Magnesium) .Marland Kitchen.. 1 By Mouth Once Daily 3)  Nasacort Aq 55 Mcg/act  Aers (Triamcinolone Acetonide(Nasal)) .Marland Kitchen.. 1 Spray Each Nostril 1 Qd 4)  Fexofenadine Hcl 180 Mg Tabs (Fexofenadine Hcl) .Marland Kitchen.. 1 By Mouth Once  Daily  Current Medications (verified): 1)  Lisinopril 40 Mg Tabs (Lisinopril) .Marland Kitchen.. 1 By Mouth Once Daily 2)  Nexium 40 Mg  Cpdr (Esomeprazole Magnesium) .Marland Kitchen.. 1 By Mouth Once Daily 3)  Nasacort Aq 55 Mcg/act  Aers (Triamcinolone Acetonide(Nasal)) .Marland Kitchen.. 1 Spray Each Nostril 1 Qd 4)  Fexofenadine Hcl 180 Mg Tabs (Fexofenadine Hcl) .Marland Kitchen.. 1 By Mouth Once Daily 5)  Azithromycin 250 Mg Tabs (Azithromycin) .... 2po Qd For 1 Day, Then 1po Qd For 4days, Then Stop 6)  Erythromycin 5 Mg/gm Oint (Erythromycin) .... Use Asd Four Times Per Day For 10 Days  Allergies (verified): 1)  Biaxin 2)  Augmentin 3)  Doxycycline  Past History:  Past Medical History: Last updated: 05/28/2009 Hypertension GERD migraine Anxiety Depression Allergic rhinitis IBS/DIARRHEA PREDOMINANT ischemic colitis june 2010  Past Surgical History: Last updated: 12/15/2007 Breast Biopsy- 2005 - neg  Social History: Last updated: 04/28/2009 Current Smoker 6 ciggs a day Alcohol use-no Single 1 child Daily Caffeine Use 2 cups a day Illicit Drug Use - no Patient does not get regular exercise.   Risk Factors: Exercise: no (04/28/2009)  Risk Factors: Smoking Status: current (12/15/2007) Packs/Day: 1 PPD (06/27/2007)  Review of Systems       all otherwise negative per pt -  Physical Exam  General:  alert and overweight-appearing. , mild ill appearing  Head:  normocephalic and atraumatic.   Eyes:  vision grossly intact, pupils equal, and pupils round., but right conjunct with erythema and cloudy d/c, with lower lid diffuse mild tender, swollen, red   Ears:  R ear normal and L ear normal.  , sinus tender bilat Nose:  nasal dischargemucosal pallor and mucosal edema.   Mouth:  pharyngeal erythema and fair dentition.   Neck:  supple and no masses.   Lungs:  normal respiratory effort and normal breath sounds.   Heart:  normal rate and regular rhythm.   Extremities:  no edema, no erythema    Impression &  Recommendations:  Problem # 1:  CONJUNCTIVITIS, ACUTE, RIGHT (ICD-372.00)  Her updated medication list for this problem includes:    Erythromycin 5 Mg/gm Oint (Erythromycin) ..... Use asd four times per day for 10 days treat as above, f/u any worsening signs or symptoms   Problem # 2:  SINUSITIS- ACUTE-NOS (ICD-461.9)  Her updated medication list for this problem includes:    Nasacort Aq 55 Mcg/act Aers (Triamcinolone acetonide(nasal)) .Marland Kitchen... 1 spray each nostril 1 qd    Azithromycin 250 Mg Tabs (Azithromycin) .Marland Kitchen... 2po qd for 1 day, then 1po qd for 4days, then stop treat as above, f/u any worsening signs or symptoms   Problem # 3:  HYPERTENSION (ICD-401.9)  Her updated medication list for this problem includes:    Lisinopril 40 Mg Tabs (Lisinopril) .Marland Kitchen... 1 by mouth once daily  BP today: 144/90 Prior BP: 162/102 (12/19/2009)  Labs Reviewed: K+: 3.9 (04/28/2009) Creat: : 0.6 (04/28/2009)    mild elev today, likely situational, ok to follow, continue same treatment   Complete Medication List: 1)  Lisinopril 40 Mg Tabs (Lisinopril) .Marland Kitchen.. 1 by mouth once daily 2)  Nexium 40 Mg Cpdr (Esomeprazole magnesium) .Marland Kitchen.. 1 by mouth once daily 3)  Nasacort Aq 55 Mcg/act Aers (Triamcinolone acetonide(nasal)) .Marland Kitchen.. 1 spray each nostril 1 qd 4)  Fexofenadine Hcl 180 Mg Tabs (Fexofenadine hcl) .Marland Kitchen.. 1 by mouth once daily 5)  Azithromycin 250 Mg Tabs (Azithromycin) .... 2po qd for 1 day, then 1po qd for 4days, then stop 6)  Erythromycin 5 Mg/gm Oint (Erythromycin) .... Use asd four times per day for 10 days  Patient Instructions: 1)  Please take all new medications as prescribed 2)  Continue all previous medications as before this visit  3)  Please schedule a follow-up appointment as needed. Prescriptions: ERYTHROMYCIN 5 MG/GM OINT (ERYTHROMYCIN) use asd four times per day for 10 days  #1 x 0   Entered and Authorized by:   Corwin Levins MD   Signed by:   Corwin Levins MD on 03/30/2010   Method  used:   Print then Give to Patient   RxID:   [id number] AZITHROMYCIN 250 MG TABS (AZITHROMYCIN) 2po qd for 1 day, then 1po qd for 4days, then stop  #6 x 1   Entered and Authorized by:   Corwin Levins MD   Signed by:   Corwin Levins MD on 03/30/2010   Method used:   Print then Give to Patient   RxID:   [id number]

## 2010-12-24 NOTE — Assessment & Plan Note (Signed)
Summary: sore throat,right ear pain/john not here today/cd   Vital Signs:  Patient profile:   45 year old female Height:      62 inches (157.48 cm) Weight:      165 pounds (75 kg) O2 Sat:      99 % on Room air Temp:     98.6 degrees F (37.00 degrees C) oral Pulse rate:   76 / minute BP sitting:   142 / 78  (left arm) Cuff size:   regular  Vitals Entered By: Orlan Leavens (Apr 10, 2010 2:29 PM)  O2 Flow:  Room air CC: (R) ear pain/ sore throat. Pt states pain radiates down (R) side. Saw Dr. Jonny Ruiz 2 weeks ago for eye infection completed 1st coarse of antibiotic. still was'nt better. Md gave refill on azithromycin started again yesterday, but pt states pain getting worse Is Patient Diabetic? No Pain Assessment Patient in pain? yes     Location: (R) ear Type: sharp/aching   Primary Care Provider:  Oliver Barre, MD  CC:  (R) ear pain/ sore throat. Pt states pain radiates down (R) side. Saw Dr. Jonny Ruiz 2 weeks ago for eye infection completed 1st coarse of antibiotic. still was'nt better. Md gave refill on azithromycin started again yesterday and but pt states pain getting worse.  History of Present Illness: here 03/30/10 with acute onset  mild to mod facial pain, pressure, fever and greenish d/c, as well as right eye discomfort with d/c and lower lip pain and swelling she has never had in the past; no trauma or chills;  no headache, ST , cough , wheezing and Pt denies CP, sob, doe, wheezing, orthopnea, pnd, worsening LE edema, palps, dizziness or syncope   -  eye symptoms have improved and ear pain is better pain symptoms persist on right side of face and neck, hard to move neck/shoulder without pain continue congestion head/throat despite zpack, second round begun 48h ago +ST, no fever continued cough  Current Medications (verified): 1)  Lisinopril 40 Mg Tabs (Lisinopril) .Marland Kitchen.. 1 By Mouth Once Daily 2)  Nexium 40 Mg  Cpdr (Esomeprazole Magnesium) .Marland Kitchen.. 1 By Mouth Once Daily 3)  Nasacort Aq  55 Mcg/act  Aers (Triamcinolone Acetonide(Nasal)) .Marland Kitchen.. 1 Spray Each Nostril 1 Qd 4)  Fexofenadine Hcl 180 Mg Tabs (Fexofenadine Hcl) .Marland Kitchen.. 1 By Mouth Once Daily 5)  Azithromycin 250 Mg Tabs (Azithromycin) .... 2po Qd For 1 Day, Then 1po Qd For 4days, Then Stop  Allergies (verified): 1)  Biaxin 2)  Augmentin 3)  Doxycycline  Past History:  Past Medical History: Hypertension GERD migraine Anxiety Depression Allergic rhinitis IBS/DIARRHEA PREDOMINANT ischemic colitis june 2010    Review of Systems       The patient complains of hoarseness.  The patient denies fever, weight loss, chest pain, syncope, dyspnea on exertion, and headaches.    Physical Exam  General:  alert and overweight-appearing. , mild ill appearing  Eyes:  vision grossly intact; pupils equal, round and reactive to light.  conjunctiva and lids normal.    Ears:  R ear normal and L ear normal.  , sinus tender bilat Mouth:  pharyngeal erythema and fair dentition.   Lungs:  normal respiratory effort, no intercostal retractions or use of accessory muscles; normal breath sounds bilaterally - no crackles and no wheezes.    Heart:  normal rate, regular rhythm, no murmur, and no rub. BLE without edema. Msk:  myofascial tightness and spasm in right shoulder/trap region - neck and right shoulder  with FROM, no impingment - neurovasc intact Neurologic:  alert & oriented X3 and cranial nerves II-XII symetrically intact.  strength normal in all extremities, sensation intact to light touch, and gait normal. speech fluent without dysarthria or aphasia; follows commands with good comprehension.    Impression & Recommendations:  Problem # 1:  SINUSITIS- ACUTE-NOS (ICD-461.9)  change Zpak to Levaquin - Her updated medication list for this problem includes:    Nasacort Aq 55 Mcg/act Aers (Triamcinolone acetonide(nasal)) .Marland Kitchen... 1 spray each nostril 1 qd    Levaquin 500 Mg Tabs (Levofloxacin) .Marland Kitchen... 1 by mouth once  daily  Orders: Prescription Created Electronically [phone])  Problem # 2:  SHOULDER PAIN, RIGHT (ICD-719.41)  +myofascial spasm on exam - neck and trap region  add muscle relaxant and cont NSAIDs/tylenol Her updated medication list for this problem includes:    Robaxin-750 750 Mg Tabs (Methocarbamol) .Marland Kitchen... 1 by mouth three times a day as needed for muscle spasm and pain  Orders: Prescription Created Electronically [phone])  Complete Medication List: 1)  Lisinopril 40 Mg Tabs (Lisinopril) .Marland Kitchen.. 1 by mouth once daily 2)  Nexium 40 Mg Cpdr (Esomeprazole magnesium) .Marland Kitchen.. 1 by mouth once daily 3)  Nasacort Aq 55 Mcg/act Aers (Triamcinolone acetonide(nasal)) .Marland Kitchen.. 1 spray each nostril 1 qd 4)  Fexofenadine Hcl 180 Mg Tabs (Fexofenadine hcl) .Marland Kitchen.. 1 by mouth once daily 5)  Levaquin 500 Mg Tabs (Levofloxacin) .Marland Kitchen.. 1 by mouth once daily 6)  Robaxin-750 750 Mg Tabs (Methocarbamol) .Marland Kitchen.. 1 by mouth three times a day as needed for muscle spasm and pain  Patient Instructions: 1)  it was good to see you today.  2)  change Zpack to Levaquin for antibiotic treatment - 3)  also use robaxin for muscle relaxant to help with the neck and shoulder pain - 4)  your prescriptions have been electronically submitted to your pharmacy. Please take as directed. Contact our office if you believe you're having problems with the medication(s).  5)  Get plenty of rest, drink lots of clear liquids, and use Tylenol or alleve for fever and comfort. Return in 7-10 days if you're not better:sooner if you're feeling worse. Prescriptions: ROBAXIN-750 750 MG TABS (METHOCARBAMOL) 1 by mouth three times a day as needed for muscle spasm and pain  #40 x 1   Entered and Authorized by:   Newt Lukes MD   Signed by:   Newt Lukes MD on 04/10/2010   Method used:   [facility]        [facility] [phone]* (retail)       [address], [place]  [address]       Ph: [phone] or [phone]        Fax: [phone]   RxID:   [id number] LEVAQUIN 500 MG TABS (LEVOFLOXACIN) 1 by mouth once daily  #7 x 0   Entered and Authorized by:   Newt Lukes MD   Signed by:   Newt Lukes MD on 04/10/2010   Method used:   [facility] to        [facility]. [phone]* (retail)       [address]       [place], [place]  [address]       Ph: [phone] or [phone]       Fax: [phone]   RxID:   [id number]

## 2010-12-24 NOTE — Assessment & Plan Note (Signed)
Summary: heel pain/cd   Vital Signs:  Patient profile:   45 year old female Height:      62 inches Weight:      164.50 pounds BMI:     30.20 O2 Sat:      98 % on Room air Temp:     98.7 degrees F oral Pulse rate:   90 / minute BP sitting:   122 / 88  (left arm) Cuff size:   regular  Vitals Entered By: Zella Ball Ewing CMA (AAMA) (July 03, 2010 4:02 PM)  O2 Flow:  Room air CC: Right foot pain for 3 months/RE, Back Pain   Primary Care Provider:  Oliver Barre, MD  CC:  Right foot pain for 3 months/RE and Back Pain.  History of Present Illness: here with c/o 3 mo persistent mild to mod sharp and dull pain to the plantar distal right foot without swelling, erythema, ulcer or known trauma ;  worse to wear high healed shoes , or stand for longer periods fo time at work, and better with flip-flops and tylenol.  No midfoot or heel or dorsal foot discomfort.  No gait change or falls.  Pt denies new neuro symptoms such as headache, facial or extremity weakness  Also with recurrence of nasal allergy symtpoms with clearish drainage, wihtout headahce, fever, colored draginage, cough and Pt denies CP, sob, doe, wheezing, orthopnea, pnd, worsening LE edema, palps, dizziness or syncope   Problems Prior to Update: 1)  Hyperlipidemia  (ICD-272.4) 2)  Foot Pain, Right  (ICD-729.5) 3)  Preventive Health Care  (ICD-V70.0) 4)  Sinusitis- Acute-nos  (ICD-461.9) 5)  Conjunctivitis, Acute, Right  (ICD-372.00) 6)  Sinusitis- Acute-nos  (ICD-461.9) 7)  Shoulder Pain, Right  (ICD-719.41) 8)  Irritable Bowel Syndrome  (ICD-564.1) 9)  Gerd  (ICD-530.81) 10)  Rectal Bleeding  (ICD-569.3) 11)  Rectal Bleeding  (ICD-569.3) 12)  Ischemic Colitis  (ICD-557.9) 13)  Common Migraine  (ICD-346.10) 14)  Sinusitis- Acute-nos  (ICD-461.9) 15)  Otitis Media, Bilateral  (ICD-382.9) 16)  Blepharitis, Right  (ICD-373.00) 17)  Acute Sinusitis, Unspecified  (ICD-461.9) 18)  Allergic Rhinitis  (ICD-477.9) 19)  Depression   (ICD-311) 20)  Anxiety  (ICD-300.00) 21)  Gerd  (ICD-530.81) 22)  Hypertension  (ICD-401.9)  Medications Prior to Update: 1)  Lisinopril 40 Mg Tabs (Lisinopril) .Marland Kitchen.. 1 By Mouth Once Daily 2)  Nexium 40 Mg  Cpdr (Esomeprazole Magnesium) .Marland Kitchen.. 1 By Mouth Once Daily 3)  Nasacort Aq 55 Mcg/act  Aers (Triamcinolone Acetonide(Nasal)) .Marland Kitchen.. 1 Spray Each Nostril 1 Qd 4)  Fexofenadine Hcl 180 Mg Tabs (Fexofenadine Hcl) .Marland Kitchen.. 1 By Mouth Once Daily 5)  Robaxin-750 750 Mg Tabs (Methocarbamol) .Marland Kitchen.. 1 By Mouth Three Times A Day As Needed For Muscle Spasm and Pain 6)  Cephalexin 500 Mg Caps (Cephalexin) .Marland Kitchen.. 1po Three Times A Day 7)  Simvastatin 20 Mg Tabs (Simvastatin) .Marland Kitchen.. 1po Once Daily  Current Medications (verified): 1)  Lisinopril 40 Mg Tabs (Lisinopril) .Marland Kitchen.. 1 By Mouth Once Daily 2)  Nexium 40 Mg  Cpdr (Esomeprazole Magnesium) .Marland Kitchen.. 1 By Mouth Once Daily 3)  Nasacort Aq 55 Mcg/act  Aers (Triamcinolone Acetonide(Nasal)) .Marland Kitchen.. 1 Spray Each Nostril  Once Daily 4)  Fexofenadine Hcl 180 Mg Tabs (Fexofenadine Hcl) .Marland Kitchen.. 1 By Mouth Once Daily 5)  Robaxin-750 750 Mg Tabs (Methocarbamol) .Marland Kitchen.. 1 By Mouth Three Times A Day As Needed For Muscle Spasm and Pain 6)  Cephalexin 500 Mg Caps (Cephalexin) .Marland Kitchen.. 1po Three Times A Day 7)  Simvastatin  20 Mg Tabs (Simvastatin) .Marland Kitchen.. 1po Once Daily 8)  Prednisone 10 Mg Tabs (Prednisone) .... 3po Qd For 3days, Then 2po Qd For 3days, Then 1po Qd For 3days, Then Stop  Allergies (verified): 1)  Biaxin 2)  Augmentin 3)  Doxycycline  Past History:  Past Surgical History: Last updated: 12/15/2007 Breast Biopsy- 2005 - neg  Social History: Last updated: 04/28/2009 Current Smoker 6 ciggs a day Alcohol use-no Single 1 child Daily Caffeine Use 2 cups a day Illicit Drug Use - no Patient does not get regular exercise.   Risk Factors: Exercise: no (04/28/2009)  Risk Factors: Smoking Status: current (12/15/2007) Packs/Day: 1 PPD (06/27/2007)  Past Medical  History: Hypertension GERD migraine Anxiety Depression Allergic rhinitis IBS/DIARRHEA PREDOMINANT ischemic colitis june 2010   Hyperlipidemia  Review of Systems       all otherwise negative per pt -    Physical Exam  General:  alert and overweight-appearing.   Head:  normocephalic and atraumatic.   Eyes:  vision grossly intact, pupils equal, and pupils round.   Ears:  R ear normal and L ear normal.   Nose:  nasal dischargemucosal pallor and mucosal edema.   Mouth:  no gingival abnormalities and pharynx pink and moist.   Neck:  supple and no masses.   Lungs:  normal respiratory effort and normal breath sounds.   Heart:  normal rate and regular rhythm.   Msk:  mod tender to plantar aspect of the 2nd MTP and toe without swelling, erythema adn foot o/w neurovasc intact Extremities:  no edema, no erythema  Neurologic:  sensation intact to light touch and gait normal.     Impression & Recommendations:  Problem # 1:  FOOT PAIN, RIGHT (ICD-729.5) c/w tendonitis/arthritis - try pred pack and refer podiatry Orders: Podiatry Referral (Podiatry)  Problem # 2:  HYPERTENSION (ICD-401.9)  Her updated medication list for this problem includes:    Lisinopril 40 Mg Tabs (Lisinopril) .Marland Kitchen... 1 by mouth once daily  BP today: 122/88 Prior BP: 162/100 (06/25/2010)  Labs Reviewed: K+: 4.3 (06/25/2010) Creat: : 0.7 (06/25/2010)   Chol: 259 (06/25/2010)   HDL: 41.20 (06/25/2010)   TG: 110.0 (06/25/2010) stable overall by hx and exam, ok to continue meds/tx as is   Problem # 3:  HYPERLIPIDEMIA (ICD-272.4)  Her updated medication list for this problem includes:    Simvastatin 20 Mg Tabs (Simvastatin) .Marland Kitchen... 1po once daily  Labs Reviewed: SGOT: 16 (06/25/2010)   SGPT: 13 (06/25/2010)   HDL:41.20 (06/25/2010)  Chol:259 (06/25/2010)  Trig:110.0 (06/25/2010) d/w pt - stable overall by hx and exam, ok to continue meds/tx as is , Pt to continue diet efforts, good med tolerance; to check labs -  goal LDL less than 100  Problem # 4:  ALLERGIC RHINITIS (ICD-477.9)  Her updated medication list for this problem includes:    Nasacort Aq 55 Mcg/act Aers (Triamcinolone acetonide(nasal)) .Marland Kitchen... 1 spray each nostril  once daily    Fexofenadine Hcl 180 Mg Tabs (Fexofenadine hcl) .Marland Kitchen... 1 by mouth once daily treat as above, f/u any worsening signs or symptoms   Complete Medication List: 1)  Lisinopril 40 Mg Tabs (Lisinopril) .Marland Kitchen.. 1 by mouth once daily 2)  Nexium 40 Mg Cpdr (Esomeprazole magnesium) .Marland Kitchen.. 1 by mouth once daily 3)  Nasacort Aq 55 Mcg/act Aers (Triamcinolone acetonide(nasal)) .Marland Kitchen.. 1 spray each nostril  once daily 4)  Fexofenadine Hcl 180 Mg Tabs (Fexofenadine hcl) .Marland Kitchen.. 1 by mouth once daily 5)  Robaxin-750 750 Mg Tabs (Methocarbamol) .Marland KitchenMarland KitchenMarland Kitchen  1 by mouth three times a day as needed for muscle spasm and pain 6)  Cephalexin 500 Mg Caps (Cephalexin) .Marland Kitchen.. 1po three times a day 7)  Simvastatin 20 Mg Tabs (Simvastatin) .Marland Kitchen.. 1po once daily 8)  Prednisone 10 Mg Tabs (Prednisone) .... 3po qd for 3days, then 2po qd for 3days, then 1po qd for 3days, then stop   Patient Instructions: 1)  Please take all new medications as prescribed 2)  Continue all previous medications as before this visit 3)  You will be contacted about the referral(s) to: podiatry (foot doctor) 4)  Please follow lower cholesterol diet 5)  Please schedule a follow-up appointment as needed. Prescriptions: NASACORT AQ 55 MCG/ACT  AERS (TRIAMCINOLONE ACETONIDE(NASAL)) 1 spray each nostril  once daily  #1 x 11   Entered and Authorized by:   Corwin Levins MD   Signed by:   Corwin Levins MD on 07/04/2010   Method used:   [facility]        [facility] [phone]* (retail)       [address] [place]  [address]       Ph: [phone] or [phone]       Fax: [phone]   RxID:   [id number] PREDNISONE 10 MG TABS (PREDNISONE) 3po qd for 3days, then 2po qd for 3days, then 1po qd for 3days, then stop  #18  x 0   Entered and Authorized by:   Corwin Levins MD   Signed by:   Corwin Levins MD on 07/03/2010   Method used:   Print then Give to Patient   RxID:   [id number]

## 2010-12-24 NOTE — Assessment & Plan Note (Signed)
Summary: sinus headache-lb   Vital Signs:  Patient profile:   45 year old female Height:      62 inches Weight:      164 pounds O2 Sat:      98 % on Room air Temp:     98.4 degrees F oral Pulse rate:   70 / minute BP sitting:   162 / 100  (left arm) Cuff size:   regular  Vitals Entered By: Zella Ball Ewing CMA Duncan Dull) (June 25, 2010 9:03 AM)  O2 Flow:  Room air  Preventive Care Screening  Mammogram:    Date:  09/22/2009    Results:  normal   CC: Sinus Headache for 1 day/RE   Primary Care Provider:  Oliver Barre, MD  CC:  Sinus Headache for 1 day/RE.  History of Present Illness: here with 2 days facial pain, pressure,f ever and greenish d/c;  has seen both ENT and allergy in the past ;  seems to recur 3 to 4 times per yr;  Pt denies CP, sob, doe, wheezing, orthopnea, pnd, worsening LE edema, palps, dizziness or syncope  No ST or cough.  Pt denies new neuro symptoms such as headache, facial or extremity weakness  Denies polys.  Overall good compliance with meds, good tolerability.  Has FMLA for recurrent sinus issues.  Chart review shows persitsent elev BP despite illness today;  has been trying to avoid salt and be more active, wt overall stable.    Problems Prior to Update: 1)  Conjunctivitis, Acute, Right  (ICD-372.00) 2)  Sinusitis- Acute-nos  (ICD-461.9) 3)  Shoulder Pain, Right  (ICD-719.41) 4)  Irritable Bowel Syndrome  (ICD-564.1) 5)  Gerd  (ICD-530.81) 6)  Rectal Bleeding  (ICD-569.3) 7)  Rectal Bleeding  (ICD-569.3) 8)  Ischemic Colitis  (ICD-557.9) 9)  Common Migraine  (ICD-346.10) 10)  Sinusitis- Acute-nos  (ICD-461.9) 11)  Otitis Media, Bilateral  (ICD-382.9) 12)  Blepharitis, Right  (ICD-373.00) 13)  Acute Sinusitis, Unspecified  (ICD-461.9) 14)  Allergic Rhinitis  (ICD-477.9) 15)  Depression  (ICD-311) 16)  Anxiety  (ICD-300.00) 17)  Gerd  (ICD-530.81) 18)  Hypertension  (ICD-401.9)  Medications Prior to Update: 1)  Lisinopril 40 Mg Tabs (Lisinopril) .Marland Kitchen..  1 By Mouth Once Daily 2)  Nexium 40 Mg  Cpdr (Esomeprazole Magnesium) .Marland Kitchen.. 1 By Mouth Once Daily 3)  Nasacort Aq 55 Mcg/act  Aers (Triamcinolone Acetonide(Nasal)) .Marland Kitchen.. 1 Spray Each Nostril 1 Qd 4)  Fexofenadine Hcl 180 Mg Tabs (Fexofenadine Hcl) .Marland Kitchen.. 1 By Mouth Once Daily 5)  Robaxin-750 750 Mg Tabs (Methocarbamol) .Marland Kitchen.. 1 By Mouth Three Times A Day As Needed For Muscle Spasm and Pain  Current Medications (verified): 1)  Lisinopril 40 Mg Tabs (Lisinopril) .Marland Kitchen.. 1 By Mouth Once Daily 2)  Nexium 40 Mg  Cpdr (Esomeprazole Magnesium) .Marland Kitchen.. 1 By Mouth Once Daily 3)  Nasacort Aq 55 Mcg/act  Aers (Triamcinolone Acetonide(Nasal)) .Marland Kitchen.. 1 Spray Each Nostril 1 Qd 4)  Fexofenadine Hcl 180 Mg Tabs (Fexofenadine Hcl) .Marland Kitchen.. 1 By Mouth Once Daily 5)  Robaxin-750 750 Mg Tabs (Methocarbamol) .Marland Kitchen.. 1 By Mouth Three Times A Day As Needed For Muscle Spasm and Pain 6)  Cephalexin 500 Mg Caps (Cephalexin) .Marland Kitchen.. 1po Three Times A Day 7)  Amlodipine Besylate 5 Mg Tabs (Amlodipine Besylate) .Marland Kitchen.. 1 By Mouth Once Daily  Allergies (verified): 1)  Biaxin 2)  Augmentin 3)  Doxycycline  Past History:  Past Medical History: Last updated: 04/10/2010 Hypertension GERD migraine Anxiety Depression Allergic rhinitis IBS/DIARRHEA PREDOMINANT ischemic  colitis june 2010    Past Surgical History: Last updated: 12/15/2007 Breast Biopsy- 2005 - neg  Family History: Last updated: 04/28/2009 mother with breast cancer, HTN ovary cancer - aunt DM- granmother paternal, mother No FH of Colon Cancer:  Social History: Last updated: 04/28/2009 Current Smoker 6 ciggs a day Alcohol use-no Single 1 child Daily Caffeine Use 2 cups a day Illicit Drug Use - no Patient does not get regular exercise.   Risk Factors: Exercise: no (04/28/2009)  Risk Factors: Smoking Status: current (12/15/2007) Packs/Day: 1 PPD (06/27/2007)  Review of Systems  The patient denies anorexia, fever, vision loss, decreased hearing, hoarseness,  chest pain, syncope, dyspnea on exertion, peripheral edema, prolonged cough, headaches, hemoptysis, abdominal pain, melena, hematochezia, severe indigestion/heartburn, hematuria, muscle weakness, suspicious skin lesions, transient blindness, difficulty walking, depression, unusual weight change, abnormal bleeding, enlarged lymph nodes, and angioedema.         all otherwise negative per pt -    Physical Exam  General:  alert and overweight-appearing.   Head:  normocephalic and atraumatic.   Eyes:  vision grossly intact, pupils equal, and pupils round.   Ears:  bilat tm's mild eyrthema, canals clear';  sinus marked tender bilat max area Nose:  nasal dischargemucosal pallor and mucosal edema.   Mouth:  pharyngeal erythema and fair dentition.   Neck:  supple and cervical lymphadenopathy.   Lungs:  normal respiratory effort and normal breath sounds.   Heart:  normal rate and regular rhythm.   Abdomen:  soft, non-tender, and normal bowel sounds.   Msk:  no joint tenderness and no joint swelling.   Extremities:  no edema, no erythema  Neurologic:  cranial nerves II-XII intact and strength normal in all extremities.     Impression & Recommendations:  Problem # 1:  Preventive Health Care (ICD-V70.0)  Overall doing well, age appropriate education and counseling updated and referral for appropriate preventive services done unless declined, immunizations up to date or declined, diet counseling done if overweight, urged to quit smoking if smokes , most recent labs reviewed and current ordered if appropriate, ecg reviewed or declined (interpretation per ECG scanned in the EMR if done); information regarding Medicare Prevention requirements given if appropriate; speciality referrals updated as appropriate   Orders: TLB-BMP (Basic Metabolic Panel-BMET) (80048-METABOL) TLB-CBC Platelet - w/Differential (85025-CBCD) TLB-Hepatic/Liver Function Pnl (80076-HEPATIC) TLB-Lipid Panel (80061-LIPID) TLB-TSH  (Thyroid Stimulating Hormone) (84443-TSH) TLB-Udip ONLY (81003-UDIP)  Problem # 2:  SINUSITIS- ACUTE-NOS (ICD-461.9)  Her updated medication list for this problem includes:    Nasacort Aq 55 Mcg/act Aers (Triamcinolone acetonide(nasal)) .Marland Kitchen... 1 spray each nostril 1 qd    Cephalexin 500 Mg Caps (Cephalexin) .Marland Kitchen... 1po three times a day treat as above, f/u any worsening signs or symptoms   Problem # 3:  HYPERTENSION (ICD-401.9)  Her updated medication list for this problem includes:    Lisinopril 40 Mg Tabs (Lisinopril) .Marland Kitchen... 1 by mouth once daily    Amlodipine Besylate 5 Mg Tabs (Amlodipine besylate) .Marland Kitchen... 1 by mouth once daily to add the amlodipine 5 mg; f/u any persist elev BP  BP today: 162/100 Prior BP: 142/78 (04/10/2010)  Labs Reviewed: K+: 3.9 (04/28/2009) Creat: : 0.6 (04/28/2009)     Complete Medication List: 1)  Lisinopril 40 Mg Tabs (Lisinopril) .Marland Kitchen.. 1 by mouth once daily 2)  Nexium 40 Mg Cpdr (Esomeprazole magnesium) .Marland Kitchen.. 1 by mouth once daily 3)  Nasacort Aq 55 Mcg/act Aers (Triamcinolone acetonide(nasal)) .Marland Kitchen.. 1 spray each nostril 1 qd 4)  Fexofenadine  Hcl 180 Mg Tabs (Fexofenadine hcl) .Marland Kitchen.. 1 by mouth once daily 5)  Robaxin-750 750 Mg Tabs (Methocarbamol) .Marland Kitchen.. 1 by mouth three times a day as needed for muscle spasm and pain 6)  Cephalexin 500 Mg Caps (Cephalexin) .Marland Kitchen.. 1po three times a day 7)  Amlodipine Besylate 5 Mg Tabs (Amlodipine besylate) .Marland Kitchen.. 1 by mouth once daily  Patient Instructions: 1)  Please take all new medications as prescribed 2)  Continue all previous medications as before this visit  3)  Please go to the Lab in the basement for your blood and/or urine tests today  4)  Please schedule a follow-up appointment in 6 months. 5)  Check your Blood Pressure regularly. If it is above 140/90: you should make an appointment. Prescriptions: AMLODIPINE BESYLATE 5 MG TABS (AMLODIPINE BESYLATE) 1 by mouth once daily  #90 x 3   Entered and Authorized by:    Corwin Levins MD   Signed by:   Corwin Levins MD on 06/25/2010   Method used:   Print then Give to Patient   RxID:   [id number] CEPHALEXIN 500 MG CAPS (CEPHALEXIN) 1po three times a day  #30 x 0   Entered and Authorized by:   Corwin Levins MD   Signed by:   Corwin Levins MD on 06/25/2010   Method used:   Print then Give to Patient   RxID:   [id number]

## 2010-12-24 NOTE — Progress Notes (Signed)
  Phone Note From Pharmacy   [facility] [phone]* Summary of Call: Do You want to keep patient on both Simvastatin 20mg  and Amlodipine 5mg , please advise? Initial call taken by: Robin Ewing CMA Duncan Dull),  June 29, 2010 10:38 AM  Follow-up for Phone Call        yes, this should be ok at these doses Follow-up by: Corwin Levins MD,  June 29, 2010 12:54 PM  Additional Follow-up for Phone Call Additional follow up Details #1::        Informed Pharmacy that MD said this would be ok for both. Additional Follow-up by: Robin Ewing CMA Duncan Dull),  June 29, 2010 1:47 PM

## 2010-12-24 NOTE — Progress Notes (Signed)
Summary: podiatry referral  Phone Note Call from Patient Call back at Home Phone [phone] Call back at [phone]   Reason for Call: Referral Summary of Call: Pt would like another referral to a podiatrist. Pt was contacted for an appointment at the end of August, but declined. She now would like to see a podiatrist Initial call taken by: Brenton Grills MA,  August 05, 2010 1:54 PM  Follow-up for Phone Call        ok - I will refer Follow-up by: Corwin Levins MD,  August 05, 2010 2:09 PM  Additional Follow-up for Phone Call Additional follow up Details #1::        pt informed  Additional Follow-up by: Brenton Grills MA,  August 05, 2010 2:33 PM

## 2010-12-24 NOTE — Letter (Addendum)
Summary: Primary Care Consult Scheduled Letter  [facility]  [address] [place], [place] [address]   Phone: [phone]  Fax: [phone]      12/18/2010 MRN: [id number]  [facility] [address] [place], [place]  [address]    Dear Ms. Gilpatrick,      We have scheduled an appointment for you.  At the recommendation of Dr.John, we have scheduled you a consult with Dr Maple Hudson [facility] on [facility] March 1,2012 at 2:00 PM.Their address is [address] .The office phone number is [phone].  If this appointment day and time is not convenient for you, please feel free to call the office of the doctor you are being referred to at the number listed above and reschedule the appointment.     It is important for you to keep your scheduled appointments. We are here to make sure you are given good patient care. If you have questions or you have made changes to your appointment, please notify us at  [phone]       , ask for Debra.    Thank you,  Patient Care Coordinator [facility]

## 2010-12-24 NOTE — Progress Notes (Signed)
Summary: Severe abdominal pain   Phone Note Call from Patient Call back at [phone]   Caller: Patient Call For: Dr. Russella Dar Summary of Call: patient ate something yesterday but now she is having severe abdominal pain and she can only have liquids. She says the pain is under her left breast Initial call taken by: Harlow Mares CMA Duncan Dull),  August 20, 2010 10:36 AM  Follow-up for Phone Call        patient c/o painful swallowing that started yesterday after eating a hot dog.  Pain is mostly under her left breast.  She c/o pain when she tries to eat anything solid.  She is able to swallow liquids, but it is painful. Patient  will come in this afternoon and see Amy Esterwood PA. Follow-up by: Darcey Nora RN, CGRN,  August 20, 2010 10:58 AM     Appended Document: Severe abdominal pain I reviewed with Mike Gip PA.  Appt has been canceled she needs to start on Carafate 1 gm three times a day  x5 days, increase her nexium to two times a day, remain on a soft diet.  She is to call in the morning if her symptoms are no better.    I have left a message for the patient to call me back to discuss.  Appended Document: Severe abdominal pain Patient aware of Amy Esterwood PA's recommendations.  She verblaizes understanding to call back if she has worsening of her symptoms.   Clinical Lists Changes  Medications: Added new medication of CARAFATE 1 GM/10ML SUSP (SUCRALFATE) 1 gm three times a day, 30 minutes before meals - Signed Rx of CARAFATE 1 GM/10ML SUSP (SUCRALFATE) 1 gm three times a day, 30 minutes before meals;  #150 ml x 0;  Signed;  Entered by: Darcey Nora RN, CGRN;  Authorized by: Sammuel Cooper PA-c;  Method used: [facility] [phone]*, [address] [place], [place]  [address], Ph: [phone] or [phone], Fax: [phone]    Prescriptions: CARAFATE 1 GM/10ML SUSP (SUCRALFATE) 1 gm three times a day, 30 minutes before meals  #150 ml x 0   Entered by:    Darcey Nora RN, CGRN   Authorized by:   Sammuel Cooper PA-c   Signed by:   Darcey Nora RN, CGRN on 08/20/2010   Method used:   [facility]ronically to        [facility] [phone]* (retail)       [address]       [place], [place]  [address]       Ph: [phone] or [phone]       Fax: [phone]   RxID:   [id number]

## 2010-12-24 NOTE — Assessment & Plan Note (Signed)
Summary: VERY FATIGUE--D/T---STC   Vital Signs:  Patient profile:   45 year old female Height:      62 inches Weight:      165.38 pounds BMI:     30.36 O2 Sat:      97 % on Room air Temp:     98.5 degrees F oral Pulse rate:   82 / minute BP sitting:   118 / 80  (left arm) Cuff size:   regular  Vitals Entered By: Zella Ball Ewing CMA Duncan Dull) (August 25, 2010 10:13 AM)  O2 Flow:  Room air CC: fatigue, chest pain/RE   Primary Care Provider:  Oliver Barre, MD  CC:  fatigue and chest pain/RE.  History of Present Illness: here to /fu - has ongoing fatigue, and intermitttent SSCP, dull, no radiation, located in a relatively small area, not assoc with SOB, DOE, diaphorses, n/v, palp, dizz, syncope; is pleuritic with deep breaths - lasts about a mintues, comes and goes several times per day; did not notice if worse with left shoudler moveemtn, and rubs the area - not sure if it helps. ST, fever, cough,  Does have occas reflux but htis discomfort seems different.   Does have some discomfort to movement of the left shoudler and upper back, but mild, adn resolved after several days last wk.  Did have an episode of hot dog dysphagia recently, called Dr Russella Dar and tx per EMR.  No recent falls, injury, trauma.  Incednelty - right foot symptoms resolved  - did not have to see the podiatry.  Is takng the statin , does not seem to have the myalgias.    Problems Prior to Update: 1)  Chest Pain  (ICD-786.50) 2)  Hepatotoxicity, Drug-induced, Risk of  (ICD-V58.69) 3)  Fatigue  (ICD-780.79) 4)  Hyperlipidemia  (ICD-272.4) 5)  Foot Pain, Right  (ICD-729.5) 6)  Preventive Health Care  (ICD-V70.0) 7)  Sinusitis- Acute-nos  (ICD-461.9) 8)  Conjunctivitis, Acute, Right  (ICD-372.00) 9)  Sinusitis- Acute-nos  (ICD-461.9) 10)  Shoulder Pain, Right  (ICD-719.41) 11)  Irritable Bowel Syndrome  (ICD-564.1) 12)  Gerd  (ICD-530.81) 13)  Rectal Bleeding  (ICD-569.3) 14)  Rectal Bleeding  (ICD-569.3) 15)  Ischemic  Colitis  (ICD-557.9) 16)  Common Migraine  (ICD-346.10) 17)  Sinusitis- Acute-nos  (ICD-461.9) 18)  Otitis Media, Bilateral  (ICD-382.9) 19)  Blepharitis, Right  (ICD-373.00) 20)  Acute Sinusitis, Unspecified  (ICD-461.9) 21)  Allergic Rhinitis  (ICD-477.9) 22)  Depression  (ICD-311) 23)  Anxiety  (ICD-300.00) 24)  Gerd  (ICD-530.81) 25)  Hypertension  (ICD-401.9)  Medications Prior to Update: 1)  Lisinopril 40 Mg Tabs (Lisinopril) .Marland Kitchen.. 1 By Mouth Once Daily 2)  Nexium 40 Mg  Cpdr (Esomeprazole Magnesium) .Marland Kitchen.. 1 By Mouth Once Daily 3)  Nasacort Aq 55 Mcg/act  Aers (Triamcinolone Acetonide(Nasal)) .Marland Kitchen.. 1 Spray Each Nostril  Once Daily 4)  Fexofenadine Hcl 180 Mg Tabs (Fexofenadine Hcl) .Marland Kitchen.. 1 By Mouth Once Daily 5)  Robaxin-750 750 Mg Tabs (Methocarbamol) .Marland Kitchen.. 1 By Mouth Three Times A Day As Needed For Muscle Spasm and Pain 6)  Cephalexin 500 Mg Caps (Cephalexin) .Marland Kitchen.. 1po Three Times A Day 7)  Simvastatin 20 Mg Tabs (Simvastatin) .Marland Kitchen.. 1po Once Daily 8)  Prednisone 10 Mg Tabs (Prednisone) .... 3po Qd For 3days, Then 2po Qd For 3days, Then 1po Qd For 3days, Then Stop 9)  Carafate 1 Gm/66ml Susp (Sucralfate) .Marland Kitchen.. 1 Gm Three Times A Day, 30 Minutes Before Meals  Current Medications (verified): 1)  Lisinopril 40 Mg Tabs (Lisinopril) .Marland Kitchen.. 1 By Mouth Once Daily 2)  Nexium 40 Mg  Cpdr (Esomeprazole Magnesium) .Marland Kitchen.. 1 By Mouth Once Daily 3)  Nasacort Aq 55 Mcg/act  Aers (Triamcinolone Acetonide(Nasal)) .Marland Kitchen.. 1 Spray Each Nostril  Once Daily 4)  Fexofenadine Hcl 180 Mg Tabs (Fexofenadine Hcl) .Marland Kitchen.. 1 By Mouth Once Daily 5)  Robaxin-750 750 Mg Tabs (Methocarbamol) .Marland Kitchen.. 1 By Mouth Three Times A Day As Needed For Muscle Spasm and Pain 6)  Azithromycin 250 Mg Tabs (Azithromycin) .... 2po Qd For 1 Day, Then 1po Qd For 4days, Then Stop 7)  Simvastatin 20 Mg Tabs (Simvastatin) .Marland Kitchen.. 1po Once Daily 8)  Carafate 1 Gm/36ml Susp (Sucralfate) .Marland Kitchen.. 1 Gm Three Times A Day, 30 Minutes Before Meals  Allergies  (verified): 1)  Biaxin 2)  Augmentin 3)  Doxycycline  Past History:  Past Medical History: Last updated: 07/03/2010 Hypertension GERD migraine Anxiety Depression Allergic rhinitis IBS/DIARRHEA PREDOMINANT ischemic colitis june 2010   Hyperlipidemia  Past Surgical History: Last updated: 12/15/2007 Breast Biopsy- 2005 - neg  Social History: Last updated: 04/28/2009 Current Smoker 6 ciggs a day Alcohol use-no Single 1 child Daily Caffeine Use 2 cups a day Illicit Drug Use - no Patient does not get regular exercise.   Risk Factors: Exercise: no (04/28/2009)  Risk Factors: Smoking Status: current (12/15/2007) Packs/Day: 1 PPD (06/27/2007)  Review of Systems       all otherwise negative per pt -    Physical Exam  General:  alert and overweight-appearing.  , mild ill  Head:  normocephalic and atraumatic.   Eyes:  vision grossly intact, pupils equal, and pupils round.   Ears:  bilat tm's mild red, sinus tender bilat Nose:  nasal dischargemucosal pallor and mucosal edema.   Mouth:  pharyngeal erythema and fair dentition.   Neck:  supple and cervical lymphadenopathy.   Lungs:  normal respiratory effort and normal breath sounds.   Heart:  normal rate and regular rhythm.   Abdomen:  soft, non-tender, and normal bowel sounds.   Msk:  localized tender to the right sternal area approx t2 level noted, wtihout sweling or erythema or rash, which reproduces the pt pain Extremities:  no edema, no erythema  Neurologic:  strength normal in all extremities and gait normal.   Skin:  color normal and no rashes.   Psych:  moderately anxious.     Impression & Recommendations:  Problem # 1:  SINUSITIS- ACUTE-NOS (ICD-461.9)  Her updated medication list for this problem includes:    Nasacort Aq 55 Mcg/act Aers (Triamcinolone acetonide(nasal)) .Marland Kitchen... 1 spray each nostril  once daily    Azithromycin 250 Mg Tabs (Azithromycin) .Marland Kitchen... 2po qd for 1 day, then 1po qd for 4days, then  stop mild, early this time - treat as above, f/u any worsening signs or symptoms   Problem # 2:  FATIGUE (ICD-780.79) I suspect related to above, last labs reviewed with pt, and exam o/w benign - ok to follow   Problem # 3:  HYPERLIPIDEMIA (ICD-272.4)  Her updated medication list for this problem includes:    Simvastatin 20 Mg Tabs (Simvastatin) .Marland Kitchen... 1po once daily  Labs Reviewed: SGOT: 16 (06/25/2010)   SGPT: 13 (06/25/2010)   HDL:41.20 (06/25/2010)  Chol:259 (06/25/2010)  Trig:110.0 (06/25/2010) for lipid f/u - Pt to continue diet efforts, good med tolerance; to check labs - goal LDL less than 100  Orders: TLB-Lipid Panel (80061-LIPID)  Problem # 4:  CHEST PAIN (ICD-786.50) exam c/w msk - ok  to follow Orders: EKG w/ Interpretation (93000)  Complete Medication List: 1)  Lisinopril 40 Mg Tabs (Lisinopril) .Marland Kitchen.. 1 by mouth once daily 2)  Nexium 40 Mg Cpdr (Esomeprazole magnesium) .Marland Kitchen.. 1 by mouth once daily 3)  Nasacort Aq 55 Mcg/act Aers (Triamcinolone acetonide(nasal)) .Marland Kitchen.. 1 spray each nostril  once daily 4)  Fexofenadine Hcl 180 Mg Tabs (Fexofenadine hcl) .Marland Kitchen.. 1 by mouth once daily 5)  Robaxin-750 750 Mg Tabs (Methocarbamol) .Marland Kitchen.. 1 by mouth three times a day as needed for muscle spasm and pain 6)  Azithromycin 250 Mg Tabs (Azithromycin) .... 2po qd for 1 day, then 1po qd for 4days, then stop 7)  Simvastatin 20 Mg Tabs (Simvastatin) .Marland Kitchen.. 1po once daily 8)  Carafate 1 Gm/31ml Susp (Sucralfate) .Marland Kitchen.. 1 gm three times a day, 30 minutes before meals  Other Orders: TLB-Hepatic/Liver Function Pnl (80076-HEPATIC)  Patient Instructions: 1)  Please take all new medications as prescribed 2)  Continue all previous medications as before this visit  3)  Please go to the Lab in the basement for your blood and/or urine tests today 4)  Please call the number on the [facility] for results of your testing 5)  Your EKG was good today 6)  Please schedule a follow-up appointment as  needed. Prescriptions: AZITHROMYCIN 250 MG TABS (AZITHROMYCIN) 2po qd for 1 day, then 1po qd for 4days, then stop  #6 x 1   Entered and Authorized by:   Corwin Levins MD   Signed by:   Corwin Levins MD on 08/25/2010   Method used:   Print then Give to Patient   RxID:   [id number]

## 2010-12-24 NOTE — Assessment & Plan Note (Signed)
Summary: BP ELEVATED:  178/130 LAST PM---FEELING FUNNY-STC   Vital Signs:  Patient profile:   45 year old female Height:      63 inches Weight:      163 pounds BMI:     28.98 O2 Sat:      99 % on Room air Temp:     98.3 degrees F oral Pulse rate:   72 / minute BP sitting:   162 / 102  (left arm) Cuff size:   regular  Vitals Entered ByZella Ball Ewing (December 19, 2009 11:36 AM)  O2 Flow:  Room air CC: increased BP/RE   Primary Care Provider:  Oliver Barre, MD  CC:  increased BP/RE.  History of Present Illness: here with persistent elev BP the last several wks repeated several times at home, wt overal increasing and hard to lose;  Pt denies CP, sob, doe, wheezing, orthopnea, pnd, worsening LE edema, palps, dizziness or syncope  Pt denies new neuro symptoms such as headache, facial or extremity weakness   Has been much concerned and nervous with perioral and fingertip numbness, warm at night and fels sometimes thirsty.    Problems Prior to Update: 1)  Shoulder Pain, Right  (ICD-719.41) 2)  Irritable Bowel Syndrome  (ICD-564.1) 3)  Gerd  (ICD-530.81) 4)  Rectal Bleeding  (ICD-569.3) 5)  Rectal Bleeding  (ICD-569.3) 6)  Ischemic Colitis  (ICD-557.9) 7)  Common Migraine  (ICD-346.10) 8)  Sinusitis- Acute-nos  (ICD-461.9) 9)  Otitis Media, Bilateral  (ICD-382.9) 10)  Blepharitis, Right  (ICD-373.00) 11)  Acute Sinusitis, Unspecified  (ICD-461.9) 12)  Allergic Rhinitis  (ICD-477.9) 13)  Depression  (ICD-311) 14)  Anxiety  (ICD-300.00) 15)  Gerd  (ICD-530.81) 16)  Hypertension  (ICD-401.9)  Medications Prior to Update: 1)  Lisinopril 10 Mg Tabs (Lisinopril) .... Take 1 Tablet By Mouth Once A Day 2)  Nexium 40 Mg  Cpdr (Esomeprazole Magnesium) .Marland Kitchen.. 1 By Mouth Qd 3)  Nasacort Aq 55 Mcg/act  Aers (Triamcinolone Acetonide(Nasal)) .Marland Kitchen.. 1 Spray Each Nostril 1 Qd 4)  Transderm-Scop 1.5 Mg Pt72 (Scopolamine Base) .... Use Asd 1 Patch Q 3 Days As Needed 5)  Fexofenadine Hcl 180 Mg Tabs  (Fexofenadine Hcl) .Marland Kitchen.. 1 By Mouth Once Daily 6)  Cephalexin 500 Mg Caps (Cephalexin) .Marland Kitchen.. 1 By Mouth Three Times A Day  Current Medications (verified): 1)  Lisinopril 40 Mg Tabs (Lisinopril) .Marland Kitchen.. 1 By Mouth Once Daily 2)  Nexium 40 Mg  Cpdr (Esomeprazole Magnesium) .Marland Kitchen.. 1 By Mouth Once Daily 3)  Nasacort Aq 55 Mcg/act  Aers (Triamcinolone Acetonide(Nasal)) .Marland Kitchen.. 1 Spray Each Nostril 1 Qd 4)  Fexofenadine Hcl 180 Mg Tabs (Fexofenadine Hcl) .Marland Kitchen.. 1 By Mouth Once Daily  Allergies (verified): 1)  Biaxin 2)  Augmentin 3)  Doxycycline  Past History:  Past Medical History: Last updated: 05/28/2009 Hypertension GERD migraine Anxiety Depression Allergic rhinitis IBS/DIARRHEA PREDOMINANT ischemic colitis june 2010  Past Surgical History: Last updated: 12/15/2007 Breast Biopsy- 2005 - neg  Social History: Last updated: 04/28/2009 Current Smoker 6 ciggs a day Alcohol use-no Single 1 child Daily Caffeine Use 2 cups a day Illicit Drug Use - no Patient does not get regular exercise.   Risk Factors: Exercise: no (04/28/2009)  Risk Factors: Smoking Status: current (12/15/2007) Packs/Day: 1 PPD (06/27/2007)  Review of Systems       all otherwise negative per pt -   Physical Exam  General:  alert and overweight-appearing.   Head:  normocephalic and atraumatic.   Eyes:  vision  grossly intact, pupils equal, and pupils round.   Ears:  R ear normal and L ear normal.   Nose:  no external deformity and no nasal discharge.   Mouth:  no gingival abnormalities and pharynx pink and moist.   Neck:  supple and no masses.   Lungs:  normal respiratory effort and normal breath sounds.   Heart:  normal rate and regular rhythm.   Extremities:  no edema, no erythema  Neurologic:  alert & oriented X3 and cranial nerves II-XII intact.   Psych:  moderately anxious.     Impression & Recommendations:  Problem # 1:  HYPERTENSION (ICD-401.9)  Her updated medication list for this problem  includes:    Lisinopril 40 Mg Tabs (Lisinopril) .Marland Kitchen... 1 by mouth once daily  BP today: 162/102 Prior BP: 152/96 (12/01/2009)  Labs Reviewed: K+: 3.9 (04/28/2009) Creat: : 0.6 (04/28/2009)    treat as above, f/u any worsening signs or symptoms   Complete Medication List: 1)  Lisinopril 40 Mg Tabs (Lisinopril) .Marland Kitchen.. 1 by mouth once daily 2)  Nexium 40 Mg Cpdr (Esomeprazole magnesium) .Marland Kitchen.. 1 by mouth once daily 3)  Nasacort Aq 55 Mcg/act Aers (Triamcinolone acetonide(nasal)) .Marland Kitchen.. 1 spray each nostril 1 qd 4)  Fexofenadine Hcl 180 Mg Tabs (Fexofenadine hcl) .Marland Kitchen.. 1 by mouth once daily  Patient Instructions: 1)  increase the lisinopril to 40 mg per day 2)  Continue all previous medications as before this visit  3)  please try to lose wt, and no added salt in the diet 4)  Please schedule a follow-up appointment in 1 month. 5)  Check your Blood Pressure regularly. If it is above 140/90 on a regular basis after 1 -2 wlks, you should consider returning sooner Prescriptions: FEXOFENADINE HCL 180 MG TABS (FEXOFENADINE HCL) 1 by mouth once daily  #90 x 3   Entered and Authorized by:   Corwin Levins MD   Signed by:   Corwin Levins MD on 12/19/2009   Method used:   Print then Give to Patient   RxID:   [id number] NEXIUM 40 MG  CPDR (ESOMEPRAZOLE MAGNESIUM) 1 by mouth once daily  #90 x 3   Entered and Authorized by:   Corwin Levins MD   Signed by:   Corwin Levins MD on 12/19/2009   Method used:   Print then Give to Patient   RxID:   [id number] LISINOPRIL 40 MG TABS (LISINOPRIL) 1 by mouth once daily  #90 x 3   Entered and Authorized by:   Corwin Levins MD   Signed by:   Corwin Levins MD on 12/19/2009   Method used:   Print then Give to Patient   RxID:   [id number] LISINOPRIL 40 MG TABS (LISINOPRIL) 1 by mouth once daily  #90 x 3   Entered and Authorized by:   Corwin Levins MD   Signed by:   Corwin Levins MD on 12/19/2009   Method used:   Print then Give to Patient   RxID:    [id number]

## 2011-01-06 ENCOUNTER — Telehealth: Payer: Self-pay | Admitting: Internal Medicine

## 2011-01-07 ENCOUNTER — Ambulatory Visit: Payer: Self-pay | Admitting: Internal Medicine

## 2011-01-13 NOTE — Progress Notes (Signed)
Summary: ? sinus infection  Phone Note Call from Patient Call back at Home Phone [phone]   Caller: Patient--[phone] Call For: Dr Jonny Ruiz Summary of Call: Pt has cough,ear pain,?lingering sinus inf? Please advise.  Initial call taken by: Verdell Face,  January 06, 2011 8:52 AM  Follow-up for Phone Call        Called pt back let her know she would need to see md before anything could be call in. Transferred to schedulers to set up appt Follow-up by: Orlan Leavens RMA,  January 06, 2011 9:29 AM

## 2011-01-21 ENCOUNTER — Institutional Professional Consult (permissible substitution): Payer: Self-pay | Admitting: Internal Medicine

## 2011-03-01 ENCOUNTER — Other Ambulatory Visit: Payer: Self-pay | Admitting: Internal Medicine

## 2011-04-06 NOTE — Assessment & Plan Note (Signed)
[facility]                         GASTROENTEROLOGY OFFICE NOTE   NAME:BENTONContrina, Orona                      MRN:          [id number]  DATE:01/23/2008                            DOB:          [birth year]    This is a return office visit for GERD and diarrhea-predominant  irritable bowel syndrome.  Her symptoms have responded well to the  ongoing use of Nexium 40 mg p.o. q.a.m., along with diet modifications  and Robinul b.i.d. p.r.n.   CURRENT MEDICATIONS:  Listed on the chart updated and reviewed.   MEDICATION ALLERGIES:  BIAXIN, leading to nausea.   PHYSICAL EXAM:  No acute distress.  Weight 168.6 pounds, stable.  Blood pressure is 130/82, pulse 70 and  regular.  CHEST:  Clear to auscultation bilaterally.  CARDIAC:  Regular rate and rhythm without murmurs.  ABDOMEN:  Soft and nontender with normoactive bowel sounds.   ASSESSMENT AND PLAN:  1. GERD and mild gastritis.  Continue Nexium 40 mg p.o. q.a.m., along      with standard anti-reflux measures.  Return office visit in one      year.  2. Diarrhea-predominant irritable bowel syndrome.  Avoid dietary      triggers.  Continue Robinul b.i.d. p.r.n.  May also use Imodium-AD      b.i.d. p.r.n.  3. Lactose intolerance.  Long-term lactose avoidance.     [name]. Russella Dar, MD, [facility]  Electronically Signed    MTS/MedQ  DD: 01/26/2008  DT: 01/26/2008  Job #: [id number]   cc:   Corwin Levins, MD

## 2011-04-06 NOTE — Assessment & Plan Note (Signed)
[facility]                         GASTROENTEROLOGY OFFICE NOTE   Sandy Hanna, Sandy Hanna                      MRN:          [id number]  DATE:12/27/2007                            DOB:          02/18/1966    CHIEF COMPLAINT:  A 45 year old African-American female with acid  reflux, chronic diarrhea, and chronic lower abdominal pain.   HISTORY OF PRESENT ILLNESS:  Sandy Hanna is followed by Dr. Oliver Barre  and relates a history of epigastric pain and reflux symptoms for  approximately the past 2 years.  She notes her symptoms have worsened.  She notes intermittent episodes of nausea and vomiting in the evening.  She has taken omeprazole, Aciphex, and Nexium without complete control  of her symptoms.  She relates an approximately 1-year history of  intermittent crampy left lower quadrant abdominal pain associated with  gas and bloating.  She also relates intermittent loose stools for many  years.  She generally has 1 bowel movement a day.  She notes diarrhea  and gas associated with lactose products, and she generally avoids any  lactose products.  She notes no weight loss, melena, hematochezia,  change in stool caliber, dysphagia, odynophagia or hematemesis.  She  denies aspirin and NSAID usage.   PAST MEDICAL HISTORY:  1. Hypertension.  2. GERD.  3. Migraine headaches.  4. Depression.  5. Anxiety.  6. Allergic rhinitis.  7. History of bronchitis.  8. Status post left breast biopsy lesion, 2005.   CURRENT MEDICATIONS:  Listed on the chart, updated and reviewed.   MEDICATION ALLERGIES:  BIAXIN leading to nausea.   SOCIAL HISTORY/REVIEW OF SYSTEMS:  Per the handwritten form.   PHYSICAL EXAMINATION:  Overweight African-American female, no acute  distress.  Height 5 feet 2 inches, weight 168.4 pounds, blood pressure 136/88,  pulse 84 and regular.  HEENT EXAM:  Anicteric sclerae, oropharynx clear.  NECK:  Without thyromegaly or adenopathy  appreciated.  CHEST:  Clear to auscultation bilaterally.  CARDIAC:  Regular rate and rhythm without murmurs.  ABDOMEN:  Soft, nontender, nondistended, normoactive bowel sounds, no  palpable organomegaly, masses, or hernias.  NEUROLOGIC:  Alert and oriented x3.  Grossly nonfocal.   ASSESSMENT AND PLAN:  Worsening gastroesophageal reflux disease with  intermittent left lower quadrant abdominal pain associated with gas,  bloating, and diarrhea.  Obtain stool Hemoccults.  Begin antireflux  measures.  Schedule upper endoscopy.  Risks, benefits, and alternatives  to upper endoscopy and possible biopsy discussed with the patient.  She  consents to proceed.  This will be scheduled electively.  After the  endoscopy is completed, will consider a trial of antispasmodics for  possible irritable bowel syndrome as well as further evaluation with  colonoscopy.     [name]. Russella Dar, MD, [facility]  Electronically Signed    MTS/MedQ  DD: 12/28/2007  DT: 12/29/2007  Job #: [id number]   cc:   Corwin Levins, MD

## 2011-04-09 NOTE — Op Note (Signed)
NAME:  Sandy Hanna, Sandy Hanna                         ACCOUNT NO.:  [id number]   MEDICAL RECORD NO.:  [id number]                   PATIENT TYPE:  AMB   LOCATION:  DSC                                  FACILITY:  [facility]   PHYSICIAN:  Leonie Man, M.D.                DATE OF BIRTH:  [birth year]   DATE OF PROCEDURE:  03/02/2004  DATE OF DISCHARGE:                                 OPERATIVE REPORT   PREOPERATIVE DIAGNOSIS:  Ductal papillomatosis, left breast.   POSTOPERATIVE DIAGNOSIS:  Ductal papillomatosis, left breast.   PROCEDURE:  Excision of major duct, left breast.   SURGEON:  Leonie Man, M.D.   ASSISTANT:  Nurse.   ANESTHESIA:  General.   INDICATIONS FOR PROCEDURE:  Note, this patient is a 45 year old female who  presented with a bloody nipple discharge and on ductogram was noted to have  a filling defect in the duct of the left breast.  She comes to the operating  room now for excision of this duct after the risks and potential benefits of  surgery have bene fully discussed, all questions answered and consent  obtained.   PROCEDURE:  Following the induction of satisfactory general anesthesia the  patient was positioned supinely and the left breast was prepped and draped  to be included in the sterile operative field.  I used a lacrimal probe to  cannulate the draining duct.  I then made an elliptical incision around the  duct carrying the incision out to the areolar border.  The entire ellipse  including the offending duct was dissected free from the surrounding breast  tissue, removed in its entirety and forwarded for pathologic evaluation.  Hemostasis was secured with electrocautery.  Sponge and instrument counts  were verified.  The breast tissues and nipple were reconstructed using 3-0  Vicryl sutures in the deep layers and using 5-0 Monocryl suture to close the  skin. A sterile dressing was then applied, the anesthetic reversed and the  patient removed from the operating  room to the recovery room in stable  condition.  She tolerated the procedure well.                                               Leonie Man, M.D.    PB/MEDQ  D:  03/02/2004  T:  03/02/2004  Job:  [id number]   cc:   Dois Davenport A. Rivard, M.D.  [address], [address]  [place]  [place] [address]  Fax: [phone]

## 2011-05-05 ENCOUNTER — Ambulatory Visit (INDEPENDENT_AMBULATORY_CARE_PROVIDER_SITE_OTHER): Payer: BC Managed Care – PPO | Admitting: Internal Medicine

## 2011-05-05 ENCOUNTER — Encounter: Payer: Self-pay | Admitting: Internal Medicine

## 2011-05-05 VITALS — BP 110/82 | HR 76 | Temp 98.0°F | Ht 62.0 in | Wt 165.2 lb

## 2011-05-05 DIAGNOSIS — Z0000 Encounter for general adult medical examination without abnormal findings: Secondary | ICD-10-CM

## 2011-05-05 DIAGNOSIS — J019 Acute sinusitis, unspecified: Secondary | ICD-10-CM

## 2011-05-05 DIAGNOSIS — Z0001 Encounter for general adult medical examination with abnormal findings: Secondary | ICD-10-CM | POA: Insufficient documentation

## 2011-05-05 DIAGNOSIS — H01009 Unspecified blepharitis unspecified eye, unspecified eyelid: Secondary | ICD-10-CM

## 2011-05-05 DIAGNOSIS — H1030 Unspecified acute conjunctivitis, unspecified eye: Secondary | ICD-10-CM | POA: Insufficient documentation

## 2011-05-05 DIAGNOSIS — I10 Essential (primary) hypertension: Secondary | ICD-10-CM

## 2011-05-05 MED ORDER — TOBRAMYCIN 0.3 % OP SOLN: 1.0000 [drp] | Freq: Four times a day (QID) | OPHTHALMIC | Status: AC

## 2011-05-05 MED ORDER — LEVOFLOXACIN 500 MG PO TABS: 500.0000 mg | ORAL_TABLET | Freq: Every day | ORAL | Status: AC

## 2011-05-05 NOTE — Patient Instructions (Addendum)
Take all new medications as prescribed Continue all other medications as before Please return in 3 mo with Lab testing done 3-5 days before  

## 2011-05-05 NOTE — Progress Notes (Signed)
  Subjective:    Patient ID: Sandy Hanna, female    DOB: 18-Jan-1966, 45 y.o.   MRN: [id number]  HPI  Here with 3 days acute onset fever, facial pain, pressure, general weakness and malaise, and greenish d/c, with slight ST, but little to no cough and Pt denies chest pain, increased sob or doe, wheezing, orthopnea, PND, increased LE swelling, palpitations, dizziness or syncope.  Also today with acute onset bilat eye burning, pain and d/c wtihout vision change. Has slight ST but Pt denies chest pain, increased sob or doe, wheezing, orthopnea, PND, increased LE swelling, palpitations, dizziness or syncope.  Pt denies new neurological symptoms such as new headache, or facial or extremity weakness or numbness   Pt denies polydipsia, polyuria. Overall good compliance with treatment, and good medicine tolerability. Past Medical History  Diagnosis Date  . Acute sinusitis, unspecified 12/30/2007  . ALLERGIC RHINITIS 12/15/2007  . ANEMIA-NOS 12/18/2010  . ANXIETY 12/15/2007  . BLEPHARITIS, RIGHT 04/03/2008  . CHEST PAIN 08/25/2010  . COMMON MIGRAINE 12/23/2008  . CONJUNCTIVITIS, ACUTE, RIGHT 03/30/2010  . DEPRESSION 12/15/2007  . FATIGUE 08/25/2010  . FOOT PAIN, RIGHT 07/03/2010  . GERD 12/15/2007  . HYPERLIPIDEMIA 07/03/2010  . HYPERSOMNIA 12/18/2010  . HYPERTENSION 06/27/2007  . Irritable bowel syndrome 04/28/2009  . ISCHEMIC COLITIS 04/28/2009  . OTITIS MEDIA, BILATERAL 09/12/2008  . RECTAL BLEEDING 04/28/2009  . SHOULDER PAIN, RIGHT 05/28/2009   Past Surgical History  Procedure Date  . Breast biopsy 2005    Negative    reports that she has been smoking Cigarettes.  She does not have any smokeless tobacco history on file. She reports that she does not drink alcohol or use illicit drugs. family history includes Cancer in her mother and other; Diabetes in her mother and paternal grandmother; and Hypertension in her mother. Allergies  Allergen Reactions  . Clarithromycin     REACTION: Nausea  . Doxycycline    REACTION: severe fatigue per pt  . NFA:OZHYQMVHQIO+NGEXBMWUX+LKGMWNUUVO Acid+Aspartame     REACTION: nausea   Current Outpatient Prescriptions on File Prior to Visit  Medication Sig Dispense Refill  . NEXIUM 40 MG capsule TAKE ONE CAPSULE BY MOUTH ONCE DAILY  90 capsule  3   Review of Systems Review of Systems  Constitutional: Negative for diaphoresis and unexpected weight change.  HENT: Negative for drooling and tinnitus.   Eyes: Negative for photophobia and visual disturbance.  Respiratory: Negative for choking and stridor.       Objective:   Physical Exam BP 110/82  Pulse 76  Temp(Src) 98 F (36.7 C) (Oral)  Ht 5\' 2"  (1.575 m)  Wt 165 lb 4 oz (74.957 kg)  BMI 30.22 kg/m2  SpO2 98%  LMP 04/30/2011 Physical Exam  VS noted Constitutional: Pt appears well-developed and well-nourished.  HENT: Head: Normocephalic.  Right Ear: External ear normal.  Left Ear: External ear normal.  Bilat tm's mild erythema.  Sinus tender bilat.  Pharynx mild erythema Eyes: Conjunctivae bilat erythema and cloudy d/c, and EOM are normal. Pupils are equal, round, and reactive to light.  Neck: Normal range of motion. Neck supple.  Cardiovascular: Normal rate and regular rhythm.   Pulmonary/Chest: Effort normal and breath sounds normal.  Abd:  Soft, NT, non-distended, + BS Neurological: Pt is alert. No cranial nerve deficit.  Skin: Skin is warm. No erythema.  Psychiatric: Pt behavior is normal. Thought content normal.         Assessment & Plan:

## 2011-05-05 NOTE — Assessment & Plan Note (Signed)
Mild to mod, for antibx course,  to f/u any worsening symptoms or concerns 

## 2011-05-05 NOTE — Assessment & Plan Note (Signed)
stable overall by hx and exam, most recent data reviewed with pt, and pt to continue medical treatment as before  BP Readings from Last 3 Encounters:  05/05/11 110/82  12/18/10 102/72  08/25/10 118/80

## 2011-05-07 ENCOUNTER — Telehealth: Payer: Self-pay | Admitting: *Deleted

## 2011-05-07 MED ORDER — PREDNISONE 10 MG PO TABS: 10.0000 mg | ORAL_TABLET | Freq: Every day | ORAL | Status: AC

## 2011-05-07 NOTE — Telephone Encounter (Signed)
Done hardcopy to a johnson 

## 2011-05-07 NOTE — Telephone Encounter (Signed)
Pt states that she was seen in the office on Wednesday and that her symptoms are worsening since OV. She states that her face is now swollen and that she is still having eye pain. Pt has been taking Aleve and Tylenol for pain with little relief. Pt is asking for MD's advisement on something else that will help with her sxs-she states that Prednisone has helped in the past-please advise.

## 2011-05-07 NOTE — Telephone Encounter (Signed)
Rx faxed to [facility] per pt's request.

## 2011-06-24 ENCOUNTER — Ambulatory Visit (INDEPENDENT_AMBULATORY_CARE_PROVIDER_SITE_OTHER): Payer: BC Managed Care – PPO | Admitting: Internal Medicine

## 2011-06-24 ENCOUNTER — Encounter: Payer: Self-pay | Admitting: Internal Medicine

## 2011-06-24 VITALS — BP 122/72 | HR 97 | Temp 98.6°F | Ht 62.0 in | Wt 167.1 lb

## 2011-06-24 DIAGNOSIS — I10 Essential (primary) hypertension: Secondary | ICD-10-CM

## 2011-06-24 DIAGNOSIS — J019 Acute sinusitis, unspecified: Secondary | ICD-10-CM

## 2011-06-24 DIAGNOSIS — J309 Allergic rhinitis, unspecified: Secondary | ICD-10-CM

## 2011-06-24 DIAGNOSIS — F329 Major depressive disorder, single episode, unspecified: Secondary | ICD-10-CM

## 2011-06-24 MED ORDER — LEVOFLOXACIN 500 MG PO TABS: 500.0000 mg | ORAL_TABLET | Freq: Every day | ORAL | Status: AC

## 2011-06-24 NOTE — Patient Instructions (Signed)
Take all new medications as prescribed Continue all other medications as before You can also take Delsym OTC for cough, and/or Mucinex (or it's generic off brand) for congestion  

## 2011-07-04 ENCOUNTER — Encounter: Payer: Self-pay | Admitting: Internal Medicine

## 2011-07-04 DIAGNOSIS — R062 Wheezing: Secondary | ICD-10-CM | POA: Insufficient documentation

## 2011-07-04 NOTE — Progress Notes (Signed)
Subjective:    Patient ID: Sandy Hanna, female    DOB: [birth year], 45 y.o.   MRN: [id number]  HPI  Here with 3 days acute onset fever, facial pain, pressure, general weakness and malaise, and greenish d/c, with slight ST, but little to no cough and Pt denies chest pain, increased sob or doe, wheezing, orthopnea, PND, increased LE swelling, palpitations, dizziness or syncope.except for onset mild wheezing today.  Pt denies new neurological symptoms such as new headache, or facial or extremity weakness or numbness   Pt denies polydipsia, polyuria, Denies worsening depressive symptoms, suicidal ideation, or panic. Does have several wks ongoing nasal allergy symptoms with clear congestion, itch and sneeze, without fever, pain, ST, cough or wheezing, but controlled on current meds. Past Medical History  Diagnosis Date  . Acute sinusitis, unspecified 12/30/2007  . ALLERGIC RHINITIS 12/15/2007  . ANEMIA-NOS 12/18/2010  . ANXIETY 12/15/2007  . BLEPHARITIS, RIGHT 04/03/2008  . CHEST PAIN 08/25/2010  . COMMON MIGRAINE 12/23/2008  . CONJUNCTIVITIS, ACUTE, RIGHT 03/30/2010  . DEPRESSION 12/15/2007  . FATIGUE 08/25/2010  . FOOT PAIN, RIGHT 07/03/2010  . GERD 12/15/2007  . HYPERLIPIDEMIA 07/03/2010  . HYPERSOMNIA 12/18/2010  . HYPERTENSION 06/27/2007  . Irritable bowel syndrome 04/28/2009  . ISCHEMIC COLITIS 04/28/2009  . OTITIS MEDIA, BILATERAL 09/12/2008  . RECTAL BLEEDING 04/28/2009  . SHOULDER PAIN, RIGHT 05/28/2009   Past Surgical History  Procedure Date  . Breast biopsy 2005    Negative    reports that she has been smoking Cigarettes.  She does not have any smokeless tobacco history on file. She reports that she does not drink alcohol or use illicit drugs. family history includes Cancer in her mother and other; Diabetes in her mother and paternal grandmother; and Hypertension in her mother. Allergies  Allergen Reactions  . Clarithromycin     REACTION: Nausea  . Doxycycline     REACTION: severe fatigue per  pt  . WUJ:WJXBJYNWGNF+AOZHYQMVH+QIONGEXBMW Acid+Aspartame     REACTION: nausea   Current Outpatient Prescriptions on File Prior to Visit  Medication Sig Dispense Refill  . fexofenadine (ALLEGRA) 180 MG tablet Take 180 mg by mouth daily.        Marland Kitchen lisinopril (PRINIVIL,ZESTRIL) 40 MG tablet Take 40 mg by mouth daily.        . methocarbamol (ROBAXIN-750) 750 MG tablet Take 750 mg by mouth 3 (three) times daily. As needed for muscle spasm and pain.       Marland Kitchen NEXIUM 40 MG capsule TAKE ONE CAPSULE BY MOUTH ONCE DAILY  90 capsule  3  . triamcinolone (NASACORT AQ) 55 MCG/ACT nasal inhaler Place 1 spray into the nose daily.        . simvastatin (ZOCOR) 20 MG tablet Take 20 mg by mouth daily.         Review of Systems Review of Systems  Constitutional: Negative for diaphoresis and unexpected weight change.  HENT: Negative for drooling and tinnitus.   Eyes: Negative for photophobia and visual disturbance.  Respiratory: Negative for choking and stridor.   Gastrointestinal: Negative for vomiting and blood in stool.  Genitourinary: Negative for hematuria and decreased urine volume.    Objective:   Physical Exam BP 122/72  Pulse 97  Temp(Src) 98.6 F (37 C) (Oral)  Ht 5\' 2"  (1.575 m)  Wt 167 lb 2 oz (75.807 kg)  BMI 30.57 kg/m2  SpO2 99%  LMP 06/21/2011 Physical Exam  VS noted Constitutional: Pt appears well-developed and well-nourished.  HENT: Head: Normocephalic.  Right Ear: External ear normal.  Left Ear: External ear normal.  Eyes: Conjunctivae and EOM are normal. Pupils are equal, round, and reactive to light.  Neck: Normal range of motion. Neck supple.  Cardiovascular: Normal rate and regular rhythm.   Pulmonary/Chest: Effort normal and breath sounds decreased bilat with few mild wheezes.  Neurological: Pt is alert. No cranial nerve deficit.  Skin: Skin is warm. No erythema.  Psychiatric: Pt behavior is normal. Thought content normal. 1+ nervous, not depressed appearing          Assessment & Plan:

## 2011-07-04 NOTE — Assessment & Plan Note (Signed)
Mild, related to above, for predpack asd,  to f/u any worsening symptoms or concerns

## 2011-07-04 NOTE — Assessment & Plan Note (Signed)
stable overall by hx and exam, most recent data reviewed with pt, and pt to continue medical treatment as before  BP Readings from Last 3 Encounters:  06/24/11 122/72  05/05/11 110/82  12/18/10 102/72

## 2011-07-04 NOTE — Assessment & Plan Note (Signed)
stable overall by hx and exam, and pt to continue medical treatment as before 

## 2011-07-04 NOTE — Assessment & Plan Note (Signed)
stable overall by hx and exam, , and pt to continue medical treatment as before   

## 2011-07-04 NOTE — Assessment & Plan Note (Signed)
Mild to mod, for antibx course,  to f/u any worsening symptoms or concerns 

## 2011-07-10 ENCOUNTER — Other Ambulatory Visit: Payer: Self-pay | Admitting: Internal Medicine

## 2011-07-29 ENCOUNTER — Encounter: Payer: Self-pay | Admitting: Internal Medicine

## 2011-07-29 ENCOUNTER — Other Ambulatory Visit (INDEPENDENT_AMBULATORY_CARE_PROVIDER_SITE_OTHER): Payer: BC Managed Care – PPO

## 2011-07-29 ENCOUNTER — Ambulatory Visit (INDEPENDENT_AMBULATORY_CARE_PROVIDER_SITE_OTHER): Payer: BC Managed Care – PPO | Admitting: Internal Medicine

## 2011-07-29 VITALS — BP 110/70 | HR 85 | Temp 98.6°F | Ht 62.0 in | Wt 167.1 lb

## 2011-07-29 DIAGNOSIS — J019 Acute sinusitis, unspecified: Secondary | ICD-10-CM

## 2011-07-29 DIAGNOSIS — R42 Dizziness and giddiness: Secondary | ICD-10-CM

## 2011-07-29 DIAGNOSIS — J309 Allergic rhinitis, unspecified: Secondary | ICD-10-CM

## 2011-07-29 DIAGNOSIS — R109 Unspecified abdominal pain: Secondary | ICD-10-CM | POA: Insufficient documentation

## 2011-07-29 DIAGNOSIS — Z0000 Encounter for general adult medical examination without abnormal findings: Secondary | ICD-10-CM

## 2011-07-29 LAB — HEPATIC FUNCTION PANEL
ALT: 15 U/L (ref 0–35)
AST: 17 U/L (ref 0–37)
Alkaline Phosphatase: 120 U/L — ABNORMAL HIGH (ref 39–117)
Bilirubin, Direct: 0.1 mg/dL (ref 0.0–0.3)
Total Protein: 7.5 g/dL (ref 6.0–8.3)

## 2011-07-29 LAB — CBC WITH DIFFERENTIAL/PLATELET
Basophils Relative: 1 % (ref 0.0–3.0)
Eosinophils Relative: 1.2 % (ref 0.0–5.0)
Lymphocytes Relative: 39.8 % (ref 12.0–46.0)
Neutrophils Relative %: 50.6 % (ref 43.0–77.0)
RBC: 4.57 Mil/uL (ref 3.87–5.11)
WBC: 9.3 10*3/uL (ref 4.5–10.5)

## 2011-07-29 LAB — BASIC METABOLIC PANEL
BUN: 4 mg/dL — ABNORMAL LOW (ref 6–23)
Creatinine, Ser: 0.7 mg/dL (ref 0.4–1.2)
GFR: 117.85 mL/min (ref >60.00)

## 2011-07-29 LAB — LIPID PANEL
Cholesterol: 154 mg/dL (ref 0–200)
LDL Cholesterol: 92 mg/dL (ref 0–99)

## 2011-07-29 MED ORDER — MECLIZINE HCL 12.5 MG PO TABS: 12.5000 mg | ORAL_TABLET | Freq: Three times a day (TID) | ORAL | Status: AC | PRN

## 2011-07-29 MED ORDER — METHYLPREDNISOLONE ACETATE 80 MG/ML IJ SUSP
120.0000 mg | Freq: Once | INTRAMUSCULAR | Status: AC
  Administered 2011-07-29: 120 mg via INTRAMUSCULAR

## 2011-07-29 MED ORDER — METHOCARBAMOL 750 MG PO TABS: 750.0000 mg | ORAL_TABLET | Freq: Three times a day (TID) | ORAL | Status: DC

## 2011-07-29 MED ORDER — AZITHROMYCIN 250 MG PO TABS: ORAL_TABLET | ORAL | Status: AC

## 2011-07-29 NOTE — Assessment & Plan Note (Signed)
Exam benign, most likely discomfort related to other current illness, but will also check lipase;  Consider u/s to r/o GB but hold at this time

## 2011-07-29 NOTE — Patient Instructions (Addendum)
You had the steroid shot today for the allergies Take all new medications as prescribed - the zpack (sent to your pharmacy), and meclizine as needed for the dizziness Continue all other medications as before, including the robaxin (sent to the pharmacy), nexium and allergy medications You can also take Mucinex (or it's generic off brand) for congestion and ear fullness Please go to LAB in the Basement for the blood and/or urine tests to be done today Please call the phone number [phone] (the PhoneTree System) for results of testing in 2-3 days;  When calling, simply dial the number, and when prompted enter the MRN number above (the Medical Record Number) and the # key, then the message should start.

## 2011-07-29 NOTE — Progress Notes (Signed)
Subjective:    Patient ID: Sandy Hanna, female    DOB: [birth year], 45 y.o.   MRN: [id number]  HPI Here for wellness and f/u;  Overall doing ok;  Pt denies CP, worsening SOB, DOE, wheezing, orthopnea, PND, worsening LE edema, palpitations, dizziness or syncope.  Pt denies neurological change such as new Headache, facial or extremity weakness.  Pt denies polydipsia, polyuria, or low sugar symptoms. Pt states overall good compliance with treatment and medications, good tolerability, and trying to follow lower cholesterol diet.  Pt denies worsening depressive symptoms, suicidal ideation or panic. No fever, wt loss, night sweats, loss of appetite, or other constitutional symptoms.  Pt states good ability with ADL's, low fall risk, home safety reviewed and adequate, no significant changes in hearing or vision, and occasionally active with exercise.  Also Here with 7 days acute onset fever, facial pain, pressure, general weakness and malaise, and greenish d/c, with slight ST, but little to no cough and Pt denies chest pain, increased sob or doe, wheezing, orthopnea, PND, increased LE swelling, palpitations, or syncope, but did have some dizziness unusual for her , and left arm aching, as well as some nausea and pain with eating since yesterday.  Not eating a "meal" for about 7 days, but trying to keep up with fluids, no change in wt from last visit.    Does have several wks ongoing nasal allergy symptoms with clear congestion, itch and sneeze, without fever, pain, ST, cough or wheezing. Allergies  Allergen Reactions  . Clarithromycin     REACTION: Nausea  . Doxycycline     REACTION: severe fatigue per pt  . ION:GEXBMWUXLKG+MWNUUVOZD+GUYQIHKVQQ Acid+Aspartame     REACTION: nausea    Past Medical History  Diagnosis Date  . Acute sinusitis, unspecified 12/30/2007  . ALLERGIC RHINITIS 12/15/2007  . ANEMIA-NOS 12/18/2010  . ANXIETY 12/15/2007  . BLEPHARITIS, RIGHT 04/03/2008  . CHEST PAIN 08/25/2010  .  COMMON MIGRAINE 12/23/2008  . CONJUNCTIVITIS, ACUTE, RIGHT 03/30/2010  . DEPRESSION 12/15/2007  . FATIGUE 08/25/2010  . FOOT PAIN, RIGHT 07/03/2010  . GERD 12/15/2007  . HYPERLIPIDEMIA 07/03/2010  . HYPERSOMNIA 12/18/2010  . HYPERTENSION 06/27/2007  . Irritable bowel syndrome 04/28/2009  . ISCHEMIC COLITIS 04/28/2009  . OTITIS MEDIA, BILATERAL 09/12/2008  . RECTAL BLEEDING 04/28/2009  . SHOULDER PAIN, RIGHT 05/28/2009   Past Surgical History  Procedure Date  . Breast biopsy 2005    Negative    reports that she has been smoking Cigarettes.  She does not have any smokeless tobacco history on file. She reports that she does not drink alcohol or use illicit drugs. family history includes Cancer in her mother and other; Diabetes in her mother and paternal grandmother; and Hypertension in her mother. Allergies  Allergen Reactions  . Clarithromycin     REACTION: Nausea  . Doxycycline     REACTION: severe fatigue per pt  . VZD:GLOVFIEPPIR+JJOACZYSA+YTKZSWFUXN Acid+Aspartame     REACTION: nausea   Current Outpatient Prescriptions on File Prior to Visit  Medication Sig Dispense Refill  . amLODipine (NORVASC) 5 MG tablet TAKE 1 TABLET BY MOUTH EVERY DAY  90 tablet  3  . fexofenadine (ALLEGRA) 180 MG tablet Take 180 mg by mouth daily.        Marland Kitchen lisinopril (PRINIVIL,ZESTRIL) 40 MG tablet Take 40 mg by mouth daily.        Marland Kitchen NEXIUM 40 MG capsule TAKE ONE CAPSULE BY MOUTH ONCE DAILY  90 capsule  3  . simvastatin (ZOCOR) 20 MG  tablet TAKE 1 TABLET EVERY DAY  90 tablet  3  . triamcinolone (NASACORT AQ) 55 MCG/ACT nasal inhaler Place 1 spray into the nose daily.         Review of Systems Review of Systems  Constitutional: Negative for diaphoresis, activity change, appetite change and unexpected weight change.  HENT: Negative for hearing loss, ear pain, facial swelling, mouth sores and neck stiffness.   Eyes: Negative for pain, redness and visual disturbance.  Respiratory: Negative for shortness of breath and  wheezing.   Cardiovascular: Negative for chest pain and palpitations.  Gastrointestinal: Negative for diarrhea, blood in stool, abdominal distention and rectal pain.  Genitourinary: Negative for hematuria, flank pain and decreased urine volume.  Musculoskeletal: Negative for myalgias and joint swelling.  Skin: Negative for color change and wound.  Neurological: Negative for syncope and numbness.  Hematological: Negative for adenopathy.  Psychiatric/Behavioral: Negative for hallucinations, self-injury, decreased concentration and agitation.      Objective:   Physical Exam BP 110/70  Pulse 85  Temp(Src) 98.6 F (37 C) (Oral)  Ht 5\' 2"  (1.575 m)  Wt 167 lb 2 oz (75.807 kg)  BMI 30.57 kg/m2  SpO2 98% Physical Exam  VS noted, mild ill Constitutional: Pt is oriented to person, place, and time. Appears well-developed and well-nourished.  HENT:  Head: Normocephalic and atraumatic.  Right Ear: External ear normal.  Left Ear: External ear normal.  Nose: Nose normal.  Mouth/Throat: Oropharynx is clear and moist.  Bilat tm's mild erythema.  Sinus tender bilat.  Pharynx mild erythema Eyes: Conjunctivae and EOM are normal. Pupils are equal, round, and reactive to light.  Neck: Normal range of motion. Neck supple. No JVD present. No tracheal deviation present.  Cardiovascular: Normal rate, regular rhythm, normal heart sounds and intact distal pulses.   Pulmonary/Chest: Effort normal and breath sounds normal.  Abdominal: Soft. Bowel sounds are normal. There is no tenderness.  Musculoskeletal: Normal range of motion. Exhibits no edema.  Lymphadenopathy:  Has no cervical adenopathy.  Neurological: Pt is alert and oriented to person, place, and time. Pt has normal reflexes. No cranial nerve deficit. Motor/gait intact Skin: Skin is warm and dry. No rash noted.  Psychiatric:  Has  normal mood and affect. Behavior is normal.         Assessment & Plan:

## 2011-07-29 NOTE — Assessment & Plan Note (Signed)

## 2011-07-29 NOTE — Assessment & Plan Note (Signed)
Mild with left eustachian valve dysfunction as well; for zpack, and mucinex otc prn

## 2011-07-29 NOTE — Assessment & Plan Note (Signed)
With flare today moderate symtpoms - for depomedrol IM, Continue all other medications as before,  to f/u any worsening symptoms or concerns

## 2011-07-30 ENCOUNTER — Telehealth: Payer: Self-pay

## 2011-07-30 MED ORDER — METHOCARBAMOL 500 MG PO TABS: 500.0000 mg | ORAL_TABLET | Freq: Four times a day (QID) | ORAL | Status: AC

## 2011-07-30 NOTE — Telephone Encounter (Signed)
Methocarbamol 750 is on back order please give alternative

## 2011-07-30 NOTE — Telephone Encounter (Signed)
Ok to change to the robaxin 500 prn - done per emr

## 2011-07-31 ENCOUNTER — Encounter: Payer: Self-pay | Admitting: Internal Medicine

## 2011-07-31 NOTE — Assessment & Plan Note (Signed)
Mild to mod, for meclzine prn,  to f/u any worsening symptoms or concerns  

## 2011-08-02 ENCOUNTER — Ambulatory Visit: Payer: BC Managed Care – PPO

## 2011-08-02 DIAGNOSIS — Z0000 Encounter for general adult medical examination without abnormal findings: Secondary | ICD-10-CM

## 2011-08-02 LAB — URINALYSIS, ROUTINE W REFLEX MICROSCOPIC
Hgb urine dipstick: NEGATIVE
Nitrite: NEGATIVE
Total Protein, Urine: NEGATIVE
pH: 6 (ref 5.0–8.0)

## 2011-08-03 ENCOUNTER — Encounter: Payer: Self-pay | Admitting: Gastroenterology

## 2011-08-09 ENCOUNTER — Telehealth: Payer: Self-pay

## 2011-08-09 NOTE — Telephone Encounter (Signed)
Patient is requesting Ultra sound be scheduled as her stomach pain is no better, late afternoons work better for her for appointments. Call back number is [phone]

## 2011-08-09 NOTE — Telephone Encounter (Signed)
Patient informed and patient also scheduled OV with Dr. Jonny Ruiz as stomach is still hurting.

## 2011-08-09 NOTE — Telephone Encounter (Signed)
Done per emr 

## 2011-08-10 ENCOUNTER — Ambulatory Visit (INDEPENDENT_AMBULATORY_CARE_PROVIDER_SITE_OTHER): Payer: BC Managed Care – PPO | Admitting: Internal Medicine

## 2011-08-10 ENCOUNTER — Encounter: Payer: Self-pay | Admitting: Internal Medicine

## 2011-08-10 DIAGNOSIS — J019 Acute sinusitis, unspecified: Secondary | ICD-10-CM

## 2011-08-10 DIAGNOSIS — K5900 Constipation, unspecified: Secondary | ICD-10-CM | POA: Insufficient documentation

## 2011-08-10 DIAGNOSIS — R109 Unspecified abdominal pain: Secondary | ICD-10-CM

## 2011-08-10 DIAGNOSIS — Z0000 Encounter for general adult medical examination without abnormal findings: Secondary | ICD-10-CM

## 2011-08-10 DIAGNOSIS — F411 Generalized anxiety disorder: Secondary | ICD-10-CM

## 2011-08-10 DIAGNOSIS — I10 Essential (primary) hypertension: Secondary | ICD-10-CM

## 2011-08-10 DIAGNOSIS — E785 Hyperlipidemia, unspecified: Secondary | ICD-10-CM

## 2011-08-10 NOTE — Patient Instructions (Addendum)
Continue all other medications as before Please try OTC medications such as Dulcolox pills (or suppository) and Magnesium citrate Please return in 1 year for your yearly visit, or sooner if needed, with Lab testing done 3-5 days before

## 2011-08-15 ENCOUNTER — Encounter: Payer: Self-pay | Admitting: Internal Medicine

## 2011-08-15 NOTE — Assessment & Plan Note (Signed)
Persists, most c/w constipation, has u/s ordered and pending, Continue all other medications as before, as advised on OTC constipation meds, most recent labs reviewed with pt  Lab Results  Component Value Date   WBC 9.3 07/29/2011   HGB 10.6* 07/29/2011   HCT 33.9* 07/29/2011   PLT 292.0 07/29/2011   CHOL 154 07/29/2011   TRIG 61.0 07/29/2011   HDL 49.40 07/29/2011   LDLDIRECT 206.8 06/25/2010   ALT 15 07/29/2011   AST 17 07/29/2011   NA 137 07/29/2011   K 3.9 07/29/2011   CL 104 07/29/2011   CREATININE 0.7 07/29/2011   BUN 4* 07/29/2011   CO2 25 07/29/2011   TSH 1.29 07/29/2011

## 2011-08-15 NOTE — Assessment & Plan Note (Signed)
Somewhat worse situationally with pain, Continue all other medications as before, decliens further tx or counseling at this time

## 2011-08-15 NOTE — Assessment & Plan Note (Signed)
stable overall by hx and exam, most recent data reviewed with pt, and pt to continue medical treatment as before  BP Readings from Last 3 Encounters:  08/10/11 110/70  07/29/11 110/70  06/24/11 122/72

## 2011-08-15 NOTE — Assessment & Plan Note (Signed)
stable overall by hx and exam, most recent data reviewed with pt, and pt to continue medical treatment as before  Lab Results  Component Value Date   LDLCALC 92 07/29/2011

## 2011-08-15 NOTE — Assessment & Plan Note (Signed)
Resolved without recurrent, reassured, no other specific tx needed at this time

## 2011-08-15 NOTE — Progress Notes (Signed)
Subjective:    Patient ID: Sandy Hanna, female    DOB: 1966-06-22, 45 y.o.   MRN: [id number]  HPI  Here to f/u;  Sinus symptoms and vertigo have resolved though she is still concerned it may return - no c/o sinus pain, pressure, fever, sT or cough. Denies worsening depressive symptoms, suicidal ideation, or panic, though has ongoing anxiety.  Has hx of ischemic colitis approx 2 yrs ago, now with abd pain to the left side, assoc with constipation/hard stools with some nausea, but no vomiting, blood, fever, wt loss or worsening reflux.  Apple juice helped somewhat yesterday.  Last BM yesterday. Pain somewhat radiates to the back, overall mild to mod.  U/s has been ordered as per phone message sept 17.  Symtpoms now ongoing for 3 wks, actually started with several loose stools, then constipated since then. Past Medical History  Diagnosis Date  . Acute sinusitis, unspecified 12/30/2007  . ALLERGIC RHINITIS 12/15/2007  . ANEMIA-NOS 12/18/2010  . ANXIETY 12/15/2007  . BLEPHARITIS, RIGHT 04/03/2008  . CHEST PAIN 08/25/2010  . COMMON MIGRAINE 12/23/2008  . CONJUNCTIVITIS, ACUTE, RIGHT 03/30/2010  . DEPRESSION 12/15/2007  . FATIGUE 08/25/2010  . FOOT PAIN, RIGHT 07/03/2010  . GERD 12/15/2007  . HYPERLIPIDEMIA 07/03/2010  . HYPERSOMNIA 12/18/2010  . HYPERTENSION 06/27/2007  . Irritable bowel syndrome 04/28/2009  . ISCHEMIC COLITIS 04/28/2009  . OTITIS MEDIA, BILATERAL 09/12/2008  . RECTAL BLEEDING 04/28/2009  . SHOULDER PAIN, RIGHT 05/28/2009   Past Surgical History  Procedure Date  . Breast biopsy 2005    Negative    reports that she has been smoking Cigarettes.  She does not have any smokeless tobacco history on file. She reports that she does not drink alcohol or use illicit drugs. family history includes Cancer in her mother and other; Diabetes in her mother and paternal grandmother; and Hypertension in her mother. Allergies  Allergen Reactions  . Clarithromycin     REACTION: Nausea  . Doxycycline    REACTION: severe fatigue per pt  . WUJ:[id number]+AOZHYQMVH+QIONGEXBMW Acid+Aspartame     REACTION: nausea   Current Outpatient Prescriptions on File Prior to Visit  Medication Sig Dispense Refill  . amLODipine (NORVASC) 5 MG tablet TAKE 1 TABLET BY MOUTH EVERY DAY  90 tablet  3  . fexofenadine (ALLEGRA) 180 MG tablet Take 180 mg by mouth daily.        Marland Kitchen lisinopril (PRINIVIL,ZESTRIL) 40 MG tablet Take 40 mg by mouth daily.        Marland Kitchen NEXIUM 40 MG capsule TAKE ONE CAPSULE BY MOUTH ONCE DAILY  90 capsule  3  . simvastatin (ZOCOR) 20 MG tablet TAKE 1 TABLET EVERY DAY  90 tablet  3  . triamcinolone (NASACORT AQ) 55 MCG/ACT nasal inhaler Place 1 spray into the nose daily.         Review of Systems Review of Systems  Constitutional: Negative for diaphoresis and unexpected weight change.  HENT: Negative for drooling and tinnitus.   Eyes: Negative for photophobia and visual disturbance.  Respiratory: Negative for choking and stridor.   Gastrointestinal: Negative for vomiting and blood in stool.  Genitourinary: Negative for hematuria and decreased urine volume.  Musculoskeletal: Negative for gait problem.  Skin: Negative for color change and wound.  Neurological: Negative for tremors and numbness.  Psychiatric/Behavioral: Negative for decreased concentration. The patient is not hyperactive.       Objective:   Physical Exam BP 110/70  Pulse 77  Temp(Src) 98.9 F (37.2 C) (Oral)  Ht 5\' 2"  (1.575 m)  Wt 164 lb 2 oz (74.447 kg)  BMI 30.02 kg/m2  SpO2 98% Physical Exam  VS noted Constitutional: Pt appears well-developed and well-nourished.  HENT: Head: Normocephalic.  Right Ear: External ear normal.  Left Ear: External ear normal.  Eyes: Conjunctivae and EOM are normal. Pupils are equal, round, and reactive to light.  Neck: Normal range of motion. Neck supple.  Cardiovascular: Normal rate and regular rhythm.   Pulmonary/Chest: Effort normal and breath sounds normal.  Abd:  Soft, NT,  non-distended, + BS, mild LUQ tender without guarding/rebound Neurological: Pt is alert. No cranial nerve deficit.  Skin: Skin is warm. No erythema.  Psychiatric: Pt behavior is normal. Thought content normal. 1+ nervous        Assessment & Plan:

## 2011-08-23 ENCOUNTER — Telehealth: Payer: Self-pay | Admitting: Gastroenterology

## 2011-08-23 NOTE — Telephone Encounter (Signed)
Patient has had a 4 week problems with constipation.  She is taking dulcolax at the suggestion of her primary care MD.  She says this is not helping and causing pain and cramping.  I have asked that she start Miralax BID and keep the appointment with Dr Russella Dar next week.  She is also asked to increase her po fluid intake.

## 2011-08-24 ENCOUNTER — Ambulatory Visit
Admission: RE | Admit: 2011-08-24 | Discharge: 2011-08-24 | Disposition: A | Discharge status: Home / Self Care | Payer: BC Managed Care – PPO | Source: Ambulatory Visit | Attending: Internal Medicine | Admitting: Internal Medicine

## 2011-08-24 NOTE — Progress Notes (Signed)
Quick Note:  Voice message left on [employer] system - lab is negative, normal or otherwise stable, pt to continue same tx ______ 

## 2011-08-30 ENCOUNTER — Telehealth: Payer: Self-pay

## 2011-08-30 NOTE — Telephone Encounter (Signed)
Pt called requesting results of recent Ultrasound

## 2011-08-30 NOTE — Telephone Encounter (Signed)
Sept 17 2012 u/s - normal, and no changes to the GB or ducts, and no other problem found on the u/s

## 2011-08-30 NOTE — Telephone Encounter (Signed)
Patient informed. 

## 2011-08-31 ENCOUNTER — Encounter: Payer: Self-pay | Admitting: Gastroenterology

## 2011-08-31 ENCOUNTER — Ambulatory Visit (INDEPENDENT_AMBULATORY_CARE_PROVIDER_SITE_OTHER): Payer: BC Managed Care – PPO | Admitting: Gastroenterology

## 2011-08-31 VITALS — BP 136/74 | HR 72 | Ht 62.0 in | Wt 165.0 lb

## 2011-08-31 DIAGNOSIS — K5900 Constipation, unspecified: Secondary | ICD-10-CM

## 2011-08-31 DIAGNOSIS — R109 Unspecified abdominal pain: Secondary | ICD-10-CM

## 2011-08-31 DIAGNOSIS — K589 Irritable bowel syndrome without diarrhea: Secondary | ICD-10-CM

## 2011-08-31 DIAGNOSIS — K219 Gastro-esophageal reflux disease without esophagitis: Secondary | ICD-10-CM

## 2011-08-31 MED ORDER — HYOSCYAMINE SULFATE 0.125 MG PO TABS: ORAL_TABLET | ORAL | Status: DC

## 2011-08-31 NOTE — Progress Notes (Signed)
History of Present Illness: This is a 45 year old female with a one-month history of intermittent left lower quadrant pain. She has a history of irritable bowel syndrome with alternating diarrhea and constipation. Over the past few months she has mainly had difficulties with constipation. She began using MiraLax within the past few weeks and her constipation and abdominal pain have significantly improved. She underwent upper endoscopy in 2009 for GERD and a colonoscopy in 2010 for bloody diarrhea which showed mild segmental colitis consistent with ischemic colitis. A recent abdominal ultrasound was unremarkable. Denies weight loss, change in stool caliber, melena, hematochezia, nausea, vomiting, dysphagia, reflux symptoms, chest pain.  Current Medications, Allergies, Past Medical History, Past Surgical History, Family History and Social History were reviewed in [employer] record.  Physical Exam: General: Well developed , well nourished, no acute distress Head: Normocephalic and atraumatic Eyes:  sclerae anicteric, EOMI Ears: Normal auditory acuity Mouth: No deformity or lesions Lungs: Clear throughout to auscultation Heart: Regular rate and rhythm; no murmurs, rubs or bruits Abdomen: Soft, non tender and non distended. No masses, hepatosplenomegaly or hernias noted. Normal Bowel sounds Musculoskeletal: Symmetrical with no gross deformities  Pulses:  Normal pulses noted Extremities: No clubbing, cyanosis, edema or deformities noted Neurological: Alert oriented x 4, grossly nonfocal Psychological:  Alert and cooperative. Normal mood and affect  Assessment and Recommendations:  1. Irritable bowel syndrome, constipation phase. Continue MiraLax once or twice daily titrated for adequate bowel movements. Begin hyoscyamine one to two QID when necessary for abdominal pain. If symptoms not adequately controlled consider further evaluation with abdominal/pelvic CT scan and consider  a trial of another anti-spasmodic.  2. GERD. Continue Nexium 40 mg daily and standard antireflux measures.

## 2011-08-31 NOTE — Patient Instructions (Addendum)
You have been given a separate informational sheet regarding your tobacco use, the importance of quitting and local resources to help you quit. Take your Miralax 17 grams in 8 oz of water 1-2 x daily to titrate depending on bowel movements.  Your prescription for Hyoscyamine 0.125 mg has been sent to your pharmacy.  cc: Oliver Barre, MD

## 2011-11-04 ENCOUNTER — Other Ambulatory Visit: Payer: Self-pay | Admitting: Internal Medicine

## 2011-11-29 ENCOUNTER — Other Ambulatory Visit: Payer: Self-pay | Admitting: Internal Medicine

## 2011-11-29 DIAGNOSIS — Z1231 Encounter for screening mammogram for malignant neoplasm of breast: Secondary | ICD-10-CM

## 2011-12-17 ENCOUNTER — Ambulatory Visit: Payer: BC Managed Care – PPO

## 2011-12-22 ENCOUNTER — Ambulatory Visit
Admission: RE | Admit: 2011-12-22 | Discharge: 2011-12-22 | Disposition: A | Discharge status: Home / Self Care | Payer: BC Managed Care – PPO | Source: Ambulatory Visit | Attending: Internal Medicine | Admitting: Internal Medicine

## 2011-12-22 DIAGNOSIS — Z1231 Encounter for screening mammogram for malignant neoplasm of breast: Secondary | ICD-10-CM

## 2011-12-24 ENCOUNTER — Other Ambulatory Visit: Payer: Self-pay | Admitting: Internal Medicine

## 2011-12-24 DIAGNOSIS — R928 Other abnormal and inconclusive findings on diagnostic imaging of breast: Secondary | ICD-10-CM

## 2011-12-30 ENCOUNTER — Ambulatory Visit
Admission: RE | Admit: 2011-12-30 | Discharge: 2011-12-30 | Disposition: A | Discharge status: Home / Self Care | Payer: BC Managed Care – PPO | Source: Ambulatory Visit | Attending: Internal Medicine | Admitting: Internal Medicine

## 2011-12-30 DIAGNOSIS — R928 Other abnormal and inconclusive findings on diagnostic imaging of breast: Secondary | ICD-10-CM

## 2012-01-17 ENCOUNTER — Ambulatory Visit: Payer: BC Managed Care – PPO | Admitting: Internal Medicine

## 2012-01-19 ENCOUNTER — Encounter: Payer: Self-pay | Admitting: Internal Medicine

## 2012-01-19 ENCOUNTER — Ambulatory Visit (INDEPENDENT_AMBULATORY_CARE_PROVIDER_SITE_OTHER): Payer: BC Managed Care – PPO | Admitting: Internal Medicine

## 2012-01-19 VITALS — BP 112/80 | HR 78 | Temp 97.8°F | Ht 62.0 in | Wt 170.2 lb

## 2012-01-19 DIAGNOSIS — I10 Essential (primary) hypertension: Secondary | ICD-10-CM

## 2012-01-19 DIAGNOSIS — R21 Rash and other nonspecific skin eruption: Secondary | ICD-10-CM

## 2012-01-19 DIAGNOSIS — H1030 Unspecified acute conjunctivitis, unspecified eye: Secondary | ICD-10-CM

## 2012-01-19 DIAGNOSIS — H1031 Unspecified acute conjunctivitis, right eye: Secondary | ICD-10-CM

## 2012-01-19 DIAGNOSIS — J019 Acute sinusitis, unspecified: Secondary | ICD-10-CM

## 2012-01-19 MED ORDER — LEVOFLOXACIN 250 MG PO TABS: 250.0000 mg | ORAL_TABLET | Freq: Every day | ORAL | Status: DC

## 2012-01-19 MED ORDER — CLOTRIMAZOLE-BETAMETHASONE 1-0.05 % EX CREA: TOPICAL_CREAM | CUTANEOUS | Status: AC

## 2012-01-19 MED ORDER — TOBRAMYCIN 0.3 % OP SOLN: 1.0000 [drp] | Freq: Four times a day (QID) | OPHTHALMIC | Status: AC

## 2012-01-19 NOTE — Patient Instructions (Addendum)
Take all new medications as prescribed Continue all other medications as before  

## 2012-01-23 ENCOUNTER — Encounter: Payer: Self-pay | Admitting: Internal Medicine

## 2012-01-23 DIAGNOSIS — R21 Rash and other nonspecific skin eruption: Secondary | ICD-10-CM | POA: Insufficient documentation

## 2012-01-23 DIAGNOSIS — J019 Acute sinusitis, unspecified: Secondary | ICD-10-CM | POA: Insufficient documentation

## 2012-01-23 DIAGNOSIS — H1031 Unspecified acute conjunctivitis, right eye: Secondary | ICD-10-CM | POA: Insufficient documentation

## 2012-01-23 NOTE — Assessment & Plan Note (Signed)
Mild to mod, for antibx course,  to f/u any worsening symptoms or concerns 

## 2012-01-23 NOTE — Progress Notes (Signed)
Subjective:    Patient ID: Sandy Hanna, female    DOB: [birth year], 46 y.o.   MRN: [id number]  HPI   Here with 3 days acute onset fever, facial pain, pressure, general weakness and malaise, and greenish d/c, with slight ST, but little to no cough and Pt denies chest pain, increased sob or doe, wheezing, orthopnea, PND, increased LE swelling, palpitations, dizziness or syncope, also with right eye mild itch, discomfort and somewhat cloudy d/c.  Pt denies chest pain, increased sob or doe, wheezing, orthopnea, PND, increased LE swelling, palpitations, dizziness or syncope.  Pt denies new neurological symptoms such as new headache, or facial or extremity weakness or numbness   Pt denies polydipsia, polyuria.  Does have small itchy rash to right lat med abd,does not remember any contact, for 1 wk, no pain or drainage or trauma Past Medical History  Diagnosis Date  . Acute sinusitis, unspecified 12/30/2007  . ALLERGIC RHINITIS 12/15/2007  . ANEMIA-NOS 12/18/2010  . ANXIETY 12/15/2007  . BLEPHARITIS, RIGHT 04/03/2008  . CHEST PAIN 08/25/2010  . Migraines 12/23/2008  . DEPRESSION 12/15/2007  . FATIGUE 08/25/2010  . GERD 12/15/2007  . HYPERLIPIDEMIA 07/03/2010  . HYPERSOMNIA 12/18/2010  . HYPERTENSION 06/27/2007  . Irritable bowel syndrome 04/28/2009  . ISCHEMIC COLITIS 04/28/2009  . OTITIS MEDIA, BILATERAL 09/12/2008  . RECTAL BLEEDING 04/28/2009   Past Surgical History  Procedure Date  . Breast biopsy 2005    Negative    reports that she has been smoking Cigarettes.  She has been smoking about .3 packs per day. She has never used smokeless tobacco. She reports that she does not drink alcohol or use illicit drugs. family history includes Breast cancer in her mother; Diabetes in her mother and paternal grandmother; Hypertension in her mother; and Ovarian cancer in an unspecified family member. Allergies  Allergen Reactions  . Clarithromycin     REACTION: Nausea  . Doxycycline     REACTION: severe fatigue  per pt  . NWG:[id number]+QIONGEXBM+WUXLKGMWNU Acid+Aspartame     REACTION: nausea   Current Outpatient Prescriptions on File Prior to Visit  Medication Sig Dispense Refill  . amLODipine (NORVASC) 5 MG tablet TAKE 1 TABLET BY MOUTH EVERY DAY  90 tablet  3  . fexofenadine (ALLEGRA) 180 MG tablet Take 180 mg by mouth daily.        Marland Kitchen lisinopril (PRINIVIL,ZESTRIL) 40 MG tablet Take 40 mg by mouth daily.        Marland Kitchen NEXIUM 40 MG capsule TAKE ONE CAPSULE BY MOUTH ONCE DAILY  90 capsule  3  . simvastatin (ZOCOR) 20 MG tablet TAKE 1 TABLET EVERY DAY  90 tablet  3  . hyoscyamine (LEVSIN, ANASPAZ) 0.125 MG tablet 1-2 tablets by mouth four times a day as needed  60 tablet  11  . polyethylene glycol (MIRALAX / GLYCOLAX) packet Take 17 g by mouth daily.        Marland Kitchen triamcinolone (NASACORT) 55 MCG/ACT nasal inhaler USE 1 SPRAY IN EACH NOSTRIL ONCE DAILY  16.5 Inhaler  2    Review of Systems Review of Systems  Constitutional: Negative for diaphoresis and unexpected weight change.  HENT: Negative for drooling and tinnitus.   Eyes: Negative for photophobia and visual disturbance.  Respiratory: Negative for choking and stridor.   Gastrointestinal: Negative for vomiting and blood in stool.  Genitourinary: Negative for hematuria and decreased urine volume.    Objective:   Physical Exam BP 112/80  Pulse 78  Temp(Src) 97.8 F (36.6 C) (Oral)  Ht 5\' 2"  (1.575 m)  Wt 170 lb 4 oz (77.225 kg)  BMI 31.14 kg/m2  SpO2 98% Physical Exam  VS noted, mild ill Constitutional: Pt appears well-developed and well-nourished.  HENT: Head: Normocephalic.  Right Ear: External ear normal.  Left Ear: External ear normal. right conjunctiva with midl erythema, cloudy d/c Bilat tm's mild erythema.  Sinus tender.  Pharynx mild erythema Eyes: Conjunctivae and EOM are normal. Pupils are equal, round, and reactive to light.  Neck: Normal range of motion. Neck supple.  Cardiovascular: Normal rate and regular rhythm.     Pulmonary/Chest: Effort normal and breath sounds normal.  Abd:  Soft, NT, non-distended, + BS Neurological: Pt is alert. No cranial nerve deficit.  Skin: Skin is warm. No erythema. except for rash approx 2 cm  Area nontender ant axillary line below costal margin Psychiatric: Pt behavior is normal. Thought content normal.     Assessment & Plan:

## 2012-01-23 NOTE — Assessment & Plan Note (Signed)
Mild to mod, for lotrisone asd,  to f/u any worsening symptoms or concerns 

## 2012-01-23 NOTE — Assessment & Plan Note (Signed)
stable overall by hx and exam, most recent data reviewed with pt, and pt to continue medical treatment as before BP Readings from Last 3 Encounters:  01/19/12 112/80  08/31/11 136/74  08/10/11 110/70

## 2012-01-24 ENCOUNTER — Telehealth: Payer: Self-pay

## 2012-01-24 NOTE — Telephone Encounter (Signed)
The patient was seen on 01/19/2012. She was given drops for her right eye, but now the left eye is hurting and she is using the drops in that eye as well. She is concerned as the right eye is better but both eyes are painful. Please advise call back number is [phone]

## 2012-01-24 NOTE — Telephone Encounter (Signed)
Not sure what to say, except to cont same tx for left and right eyes, eventually should improve by later this wk, ok for tylenol prn

## 2012-01-25 NOTE — Telephone Encounter (Signed)
Patient informed of MD's instructions

## 2012-01-26 ENCOUNTER — Telehealth: Payer: Self-pay

## 2012-01-26 ENCOUNTER — Ambulatory Visit (INDEPENDENT_AMBULATORY_CARE_PROVIDER_SITE_OTHER): Payer: BC Managed Care – PPO | Admitting: Endocrinology

## 2012-01-26 ENCOUNTER — Encounter: Payer: Self-pay | Admitting: Endocrinology

## 2012-01-26 DIAGNOSIS — J019 Acute sinusitis, unspecified: Secondary | ICD-10-CM

## 2012-01-26 MED ORDER — CEFUROXIME AXETIL 250 MG PO TABS: 250.0000 mg | ORAL_TABLET | Freq: Two times a day (BID) | ORAL | Status: AC

## 2012-01-26 NOTE — Telephone Encounter (Signed)
Pt called stating she has been taking tylenol prn as advised for bilateral eye and facial pain with no relief. Pt is concerned that ABX are not working, please advise.

## 2012-01-26 NOTE — Telephone Encounter (Signed)
The next step would be to consider further eval such as CT  Or change in antibx - ok to see now if she wants to come in if seems not getting better

## 2012-01-26 NOTE — Telephone Encounter (Signed)
Called left message to call back 

## 2012-01-26 NOTE — Progress Notes (Signed)
Subjective:    Patient ID: Sandy Hanna, female    DOB: 04-03-1966, 46 y.o.   MRN: [id number]  HPI Pt was seen here last week for acute sinusitis.  She has a total of 10 days of moderate bilat maxillary pain, and assoc bilat otalgia.  Pt says she has seen ENT and allergy specialists, and that this did not help.   Past Medical History  Diagnosis Date  . Acute sinusitis, unspecified 12/30/2007  . ALLERGIC RHINITIS 12/15/2007  . ANEMIA-NOS 12/18/2010  . ANXIETY 12/15/2007  . BLEPHARITIS, RIGHT 04/03/2008  . CHEST PAIN 08/25/2010  . Migraines 12/23/2008  . DEPRESSION 12/15/2007  . FATIGUE 08/25/2010  . GERD 12/15/2007  . HYPERLIPIDEMIA 07/03/2010  . HYPERSOMNIA 12/18/2010  . HYPERTENSION 06/27/2007  . Irritable bowel syndrome 04/28/2009  . ISCHEMIC COLITIS 04/28/2009  . OTITIS MEDIA, BILATERAL 09/12/2008  . RECTAL BLEEDING 04/28/2009    Past Surgical History  Procedure Date  . Breast biopsy 2005    Negative    History   Social History  . Marital Status: Single    Spouse Name: N/A    Number of Children: 1  . Years of Education: N/A   Occupational History  . [employer]     [employer]   Social History Main Topics  . Smoking status: Current Everyday Smoker -- 0.3 packs/day    Types: Cigarettes  . Smokeless tobacco: Never Used   Comment: 6 cigarettes a day  . Alcohol Use: No  . Drug Use: No  . Sexually Active: Not on file   Other Topics Concern  . Not on file   Social History Narrative   Patient does not get regular exerciseDaily Caffeine- use 2 cups daily    Current Outpatient Prescriptions on File Prior to Visit  Medication Sig Dispense Refill  . amLODipine (NORVASC) 5 MG tablet TAKE 1 TABLET BY MOUTH EVERY DAY  90 tablet  3  . clotrimazole-betamethasone (LOTRISONE) cream Use as directed twice per day as needed  15 g  1  . fexofenadine (ALLEGRA) 180 MG tablet Take 180 mg by mouth daily.        . hyoscyamine (LEVSIN, ANASPAZ) 0.125 MG tablet 1-2 tablets by mouth four  times a day as needed  60 tablet  11  . lisinopril (PRINIVIL,ZESTRIL) 40 MG tablet Take 40 mg by mouth daily.        Marland Kitchen NEXIUM 40 MG capsule TAKE ONE CAPSULE BY MOUTH ONCE DAILY  90 capsule  3  . polyethylene glycol (MIRALAX / GLYCOLAX) packet Take 17 g by mouth daily.        . simvastatin (ZOCOR) 20 MG tablet TAKE 1 TABLET EVERY DAY  90 tablet  3  . tobramycin (TOBREX) 0.3 % ophthalmic solution Place 1 drop into the right eye 4 (four) times daily.  5 mL  0  . triamcinolone (NASACORT) 55 MCG/ACT nasal inhaler USE 1 SPRAY IN EACH NOSTRIL ONCE DAILY  16.5 Inhaler  2    Allergies  Allergen Reactions  . Clarithromycin     REACTION: Nausea  . Doxycycline     REACTION: severe fatigue per pt  . YQM:VHQIONGEXBM+WUXLKGMWN+UUVOZDGUYQ Acid+Aspartame     REACTION: nausea    Family History  Problem Relation Age of Onset  . Breast cancer Mother   . Hypertension Mother   . Diabetes Mother   . Diabetes Paternal Grandmother   . Ovarian cancer      Aunt    BP 142/82  Pulse 83  Temp(Src) 98.7  F (37.1 C) (Oral)  Ht 5\' 2"  (1.575 m)  Wt 168 lb (76.204 kg)  BMI 30.73 kg/m2  SpO2 99%  LMP 01/20/2012    Review of Systems Denies fever and cough, but she has nasal congestion.      Objective:   Physical Exam VITAL SIGNS:  See vs page GENERAL: no distress head: no deformity eyes: no periorbital swelling, no proptosis external nose and ears are normal mouth: no lesion seen Both eac's are red.   NECK: There is no palpable thyroid enlargement.  No thyroid nodule is palpable.  No palpable lymphadenopathy at the anterior neck.        Assessment & Plan:  Facial pain, uncertain etiology

## 2012-01-26 NOTE — Telephone Encounter (Signed)
Patient informed and had already come into the office to schedule appointment and [facility] was full and scheduled with Dr. Everardo All.

## 2012-01-26 NOTE — Patient Instructions (Addendum)
i have sent a prescription to your pharmacy, for a different antibiotic. allegra-d (non-prescription) will help your congestion. I hope you feel better soon.  If you don't feel better by next week, please call dr Jonny Ruiz.

## 2012-02-14 ENCOUNTER — Other Ambulatory Visit: Payer: Self-pay | Admitting: Internal Medicine

## 2012-03-13 ENCOUNTER — Other Ambulatory Visit: Payer: Self-pay | Admitting: Internal Medicine

## 2012-05-11 ENCOUNTER — Ambulatory Visit (INDEPENDENT_AMBULATORY_CARE_PROVIDER_SITE_OTHER): Payer: BC Managed Care – PPO | Admitting: Internal Medicine

## 2012-05-11 ENCOUNTER — Encounter: Payer: Self-pay | Admitting: Internal Medicine

## 2012-05-11 VITALS — BP 122/80 | HR 77 | Temp 98.0°F | Ht 62.0 in | Wt 167.1 lb

## 2012-05-11 DIAGNOSIS — R209 Unspecified disturbances of skin sensation: Secondary | ICD-10-CM

## 2012-05-11 DIAGNOSIS — R202 Paresthesia of skin: Secondary | ICD-10-CM | POA: Insufficient documentation

## 2012-05-11 DIAGNOSIS — I10 Essential (primary) hypertension: Secondary | ICD-10-CM

## 2012-05-11 DIAGNOSIS — F411 Generalized anxiety disorder: Secondary | ICD-10-CM

## 2012-05-11 NOTE — Progress Notes (Signed)
Subjective:    Patient ID: Sandy Hanna, female    DOB: [birth year], 46 y.o.   MRN: [id number]  HPI  Here with 2-3 wks onset tingling to the hands and feet, intermittent but troubling, not assoc with other neuro symtpoms such as pain or weakness;  No prior hx, upsetting for her.   Pt denies fever, wt loss, night sweats, loss of appetite, or other constitutional symptoms  Pt denies new neurological symptoms such as new headache, or facial or extremity weakness.  Pt denies chest pain, increased sob or doe, wheezing, orthopnea, PND, increased LE swelling, palpitations, dizziness or syncope.   Pt denies polydipsia, polyuria.  Denies worsening depressive symptoms, suicidal ideation, or panic, though has had anxiety in the past.  Nothing makes better or worse.  No hx of PAD Past Medical History  Diagnosis Date  . Acute sinusitis, unspecified 12/30/2007  . ALLERGIC RHINITIS 12/15/2007  . ANEMIA-NOS 12/18/2010  . ANXIETY 12/15/2007  . BLEPHARITIS, RIGHT 04/03/2008  . CHEST PAIN 08/25/2010  . Migraines 12/23/2008  . DEPRESSION 12/15/2007  . FATIGUE 08/25/2010  . GERD 12/15/2007  . HYPERLIPIDEMIA 07/03/2010  . HYPERSOMNIA 12/18/2010  . HYPERTENSION 06/27/2007  . Irritable bowel syndrome 04/28/2009  . ISCHEMIC COLITIS 04/28/2009  . OTITIS MEDIA, BILATERAL 09/12/2008  . RECTAL BLEEDING 04/28/2009   Past Surgical History  Procedure Date  . Breast biopsy 2005    Negative    reports that she has been smoking Cigarettes.  She has been smoking about .3 packs per day. She has never used smokeless tobacco. She reports that she does not drink alcohol or use illicit drugs. family history includes Breast cancer in her mother; Diabetes in her mother and paternal grandmother; Hypertension in her mother; and Ovarian cancer in an unspecified family member. Allergies  Allergen Reactions  . Amoxicillin-Pot Clavulanate     REACTION: nausea  . Clarithromycin     REACTION: Nausea  . Doxycycline     REACTION: severe fatigue per  pt   Current Outpatient Prescriptions on File Prior to Visit  Medication Sig Dispense Refill  . amLODipine (NORVASC) 5 MG tablet TAKE 1 TABLET BY MOUTH EVERY DAY  90 tablet  3  . clotrimazole-betamethasone (LOTRISONE) cream Use as directed twice per day as needed  15 g  1  . fexofenadine (ALLEGRA) 180 MG tablet Take 180 mg by mouth daily.        . hyoscyamine (LEVSIN, ANASPAZ) 0.125 MG tablet 1-2 tablets by mouth four times a day as needed  60 tablet  11  . lisinopril (PRINIVIL,ZESTRIL) 40 MG tablet TAKE 1 TABLET BY MOUTH EVERY DAY  90 tablet  3  . NEXIUM 40 MG capsule TAKE ONE CAPSULE BY MOUTH ONCE DAILY  90 capsule  3  . polyethylene glycol (MIRALAX / GLYCOLAX) packet Take 17 g by mouth daily.        . simvastatin (ZOCOR) 20 MG tablet TAKE 1 TABLET EVERY DAY  90 tablet  3  . triamcinolone (NASACORT) 55 MCG/ACT nasal inhaler USE 1 SPRAY IN EACH NOSTRIL ONCE DAILY  16.5 Inhaler  2   Review of Systems Review of Systems  Constitutional: Negative for diaphoresis and unexpected weight change.  HENT: Negative for  tinnitus.   Eyes: Negative for photophobia and visual disturbance.  Respiratory: Negative for choking and stridor.   Gastrointestinal: Negative for vomiting and blood in stool.  Genitourinary: Negative for hematuria and decreased urine volume.  Musculoskeletal: Negative for gait problem.  Skin: Negative for color  change and wound.  Neurological: Negative for tremors.  Psychiatric/Behavioral: Negative for decreased concentration. The patient is not hyperactive.      Objective:   Physical Exam BP 122/80  Pulse 77  Temp 98 F (36.7 C) (Oral)  Ht 5\' 2"  (1.575 m)  Wt 167 lb 2 oz (75.807 kg)  BMI 30.57 kg/m2  SpO2 99% Physical Exam  VS noted Constitutional: Pt appears well-developed and well-nourished.  HENT: Head: Normocephalic.  Right Ear: External ear normal.  Left Ear: External ear normal.  Eyes: Conjunctivae and EOM are normal. Pupils are equal, round, and reactive to  light.  Neck: Normal range of motion. Neck supple.  Cardiovascular: Normal rate and regular rhythm.   Pulmonary/Chest: Effort normal and breath sounds normal.  Neurological: Pt is alert. Not confused, motor/sens/dtr intact., gait intact Skin: Skin is warm. No erythema.  Psychiatric: Pt behavior is normal. Thought content normal. 1+ nervous    Assessment & Plan:

## 2012-05-11 NOTE — Patient Instructions (Addendum)
OK to continue the same treatment for now We will add the B12 level on to the labs for next visit Please return in Sept 2013 with Lab testing done 3-5 days before

## 2012-05-13 ENCOUNTER — Encounter: Payer: Self-pay | Admitting: Internal Medicine

## 2012-05-13 NOTE — Assessment & Plan Note (Signed)
stable overall by hx and exam, most recent data reviewed with pt, and pt to continue medical treatment as before BP Readings from Last 3 Encounters:  05/11/12 122/80  01/26/12 142/82  01/19/12 112/80

## 2012-05-13 NOTE — Assessment & Plan Note (Signed)
?   Related to paresthesias, o/w stable overall by hx and exam, most recent data reviewed with pt, and pt to continue medical treatment as before Lab Results  Component Value Date   WBC 9.3 07/29/2011   HGB 10.6* 07/29/2011   HCT 33.9* 07/29/2011   PLT 292.0 07/29/2011   GLUCOSE 92 07/29/2011   CHOL 154 07/29/2011   TRIG 61.0 07/29/2011   HDL 49.40 07/29/2011   LDLDIRECT 206.8 06/25/2010   LDLCALC 92 07/29/2011   ALT 15 07/29/2011   AST 17 07/29/2011   NA 137 07/29/2011   K 3.9 07/29/2011   CL 104 07/29/2011   CREATININE 0.7 07/29/2011   BUN 4* 07/29/2011   CO2 25 07/29/2011   TSH 1.29 07/29/2011

## 2012-05-13 NOTE — Assessment & Plan Note (Signed)
Unclear etiology, decines emg/ncs, for b12 next labs, exam o/w benign,  to f/u any worsening symptoms or concerns

## 2012-06-14 ENCOUNTER — Other Ambulatory Visit: Payer: Self-pay | Admitting: Obstetrics and Gynecology

## 2012-06-14 DIAGNOSIS — R921 Mammographic calcification found on diagnostic imaging of breast: Secondary | ICD-10-CM

## 2012-06-30 ENCOUNTER — Ambulatory Visit
Admission: RE | Admit: 2012-06-30 | Discharge: 2012-06-30 | Disposition: A | Discharge status: Home / Self Care | Payer: BC Managed Care – PPO | Source: Ambulatory Visit | Attending: Obstetrics and Gynecology | Admitting: Obstetrics and Gynecology

## 2012-06-30 DIAGNOSIS — R921 Mammographic calcification found on diagnostic imaging of breast: Secondary | ICD-10-CM

## 2012-07-10 ENCOUNTER — Encounter: Payer: Self-pay | Admitting: Internal Medicine

## 2012-07-10 ENCOUNTER — Ambulatory Visit (INDEPENDENT_AMBULATORY_CARE_PROVIDER_SITE_OTHER): Payer: BC Managed Care – PPO | Admitting: Internal Medicine

## 2012-07-10 VITALS — BP 120/78 | HR 98 | Temp 99.8°F | Ht 62.0 in | Wt 168.5 lb

## 2012-07-10 DIAGNOSIS — R062 Wheezing: Secondary | ICD-10-CM | POA: Insufficient documentation

## 2012-07-10 DIAGNOSIS — I10 Essential (primary) hypertension: Secondary | ICD-10-CM

## 2012-07-10 DIAGNOSIS — J019 Acute sinusitis, unspecified: Secondary | ICD-10-CM

## 2012-07-10 MED ORDER — HYDROCODONE-HOMATROPINE 5-1.5 MG/5ML PO SYRP: 5.0000 mL | ORAL_SOLUTION | Freq: Four times a day (QID) | ORAL | Status: AC | PRN

## 2012-07-10 MED ORDER — BENZONATATE 100 MG PO CAPS: 200.0000 mg | ORAL_CAPSULE | Freq: Three times a day (TID) | ORAL | Status: AC | PRN

## 2012-07-10 MED ORDER — PREDNISONE 10 MG PO TABS: ORAL_TABLET | ORAL | Status: DC

## 2012-07-10 MED ORDER — LEVOFLOXACIN 250 MG PO TABS: 250.0000 mg | ORAL_TABLET | Freq: Every day | ORAL | Status: AC

## 2012-07-10 NOTE — Assessment & Plan Note (Signed)
Mild to mod, for predpack asd, most likely bronchospasm related to infection, to f/u any worsening symptoms or concerns

## 2012-07-10 NOTE — Patient Instructions (Addendum)
Take all new medications as prescribed Continue all other medications as before  

## 2012-07-10 NOTE — Progress Notes (Signed)
Subjective:    Patient ID: Sandy Hanna, female    DOB: 08-14-1966, 46 y.o.   MRN: [id number]  HPI  Here with 3 days acute onset fever, facial pain, pressure, general weakness and malaise, and greenish d/c, with slight ST, but little to no cough and Pt denies chest pain, increased sob or doe, wheezing, orthopnea, PND, increased LE swelling, palpitations, dizziness or syncope, until onset mld sob and wheezing last PM.  Pt denies new neurological symptoms such as new headache, or facial or extremity weakness or numbness   Pt denies polydipsia, polyuria.   Pt denies fever, wt loss, night sweats, loss of appetite, or other constitutional symptoms except for the above. Overall good compliance with treatment, and good medicine tolerability. Past Medical History  Diagnosis Date  . Acute sinusitis, unspecified 12/30/2007  . ALLERGIC RHINITIS 12/15/2007  . ANEMIA-NOS 12/18/2010  . ANXIETY 12/15/2007  . BLEPHARITIS, RIGHT 04/03/2008  . CHEST PAIN 08/25/2010  . Migraines 12/23/2008  . DEPRESSION 12/15/2007  . FATIGUE 08/25/2010  . GERD 12/15/2007  . HYPERLIPIDEMIA 07/03/2010  . HYPERSOMNIA 12/18/2010  . HYPERTENSION 06/27/2007  . Irritable bowel syndrome 04/28/2009  . ISCHEMIC COLITIS 04/28/2009  . OTITIS MEDIA, BILATERAL 09/12/2008  . RECTAL BLEEDING 04/28/2009   Past Surgical History  Procedure Date  . Breast biopsy 2005    Negative    reports that she has been smoking Cigarettes.  She has been smoking about .3 packs per day. She has never used smokeless tobacco. She reports that she does not drink alcohol or use illicit drugs. family history includes Breast cancer in her mother; Diabetes in her mother and paternal grandmother; Hypertension in her mother; and Ovarian cancer in an unspecified family member. Allergies  Allergen Reactions  . Amoxicillin-Pot Clavulanate     REACTION: nausea  . Clarithromycin     REACTION: Nausea  . Doxycycline     REACTION: severe fatigue per pt   Current Outpatient  Prescriptions on File Prior to Visit  Medication Sig Dispense Refill  . amLODipine (NORVASC) 5 MG tablet TAKE 1 TABLET BY MOUTH EVERY DAY  90 tablet  3  . clotrimazole-betamethasone (LOTRISONE) cream Use as directed twice per day as needed  15 g  1  . fexofenadine (ALLEGRA) 180 MG tablet Take 180 mg by mouth daily.        . hyoscyamine (LEVSIN, ANASPAZ) 0.125 MG tablet 1-2 tablets by mouth four times a day as needed  60 tablet  11  . lisinopril (PRINIVIL,ZESTRIL) 40 MG tablet TAKE 1 TABLET BY MOUTH EVERY DAY  90 tablet  3  . NEXIUM 40 MG capsule TAKE ONE CAPSULE BY MOUTH ONCE DAILY  90 capsule  3  . polyethylene glycol (MIRALAX / GLYCOLAX) packet Take 17 g by mouth daily.        . simvastatin (ZOCOR) 20 MG tablet TAKE 1 TABLET EVERY DAY  90 tablet  3  . triamcinolone (NASACORT) 55 MCG/ACT nasal inhaler USE 1 SPRAY IN EACH NOSTRIL ONCE DAILY  16.5 Inhaler  2   aReview of Systems Review of Systems  Constitutional: Negative for diaphoresis and unexpected weight change.  HENT: Negative for tinnitus.   Eyes: Negative for photophobia and visual disturbance.  Respiratory: Negative for choking and stridor.   Gastrointestinal: Negative for vomiting and blood in stool.  Genitourinary: Negative for hematuria and decreased urine volume.  Musculoskeletal: Negative for gait problem.  Skin: Negative for color change and wound.  Neurological: Negative for tremors and numbness.  Psychiatric/Behavioral:  Negative for decreased concentration. The patient is not hyperactive.      Objective:   Physical Exam BP 120/78  Pulse 98  Temp 99.8 F (37.7 C) (Oral)  Ht 5\' 2"  (1.575 m)  Wt 168 lb 8 oz (76.431 kg)  BMI 30.82 kg/m2  SpO2 98% Physical Exam  VS noted, mild ill Constitutional: Pt appears well-developed and well-nourished.  HENT: Head: Normocephalic.  Right Ear: External ear normal.  Left Ear: External ear normal.  Bilat tm's mild erythema.  Sinus tender bilat.  Pharynx mild erythema Eyes:  Conjunctivae and EOM are normal. Pupils are equal, round, and reactive to light.  Neck: Normal range of motion. Neck supple.  Cardiovascular: Normal rate and regular rhythm.   Pulmonary/Chest: Effort normal and breath sounds mild decreased with few bilat wheezes Neurological: Pt is alert. Not confused, motor grossly intact Skin: Skin is warm. No erythema. No rash Psychiatric: Pt behavior is normal. Thought content normal.     Assessment & Plan:

## 2012-07-10 NOTE — Assessment & Plan Note (Signed)
stable overall by hx and exam, most recent data reviewed with pt, and pt to continue medical treatment as before BP Readings from Last 3 Encounters:  07/10/12 120/78  05/11/12 122/80  01/26/12 142/82

## 2012-07-10 NOTE — Assessment & Plan Note (Signed)
Mild to mod, for antibx course,  to f/u any worsening symptoms or concerns 

## 2012-07-23 ENCOUNTER — Other Ambulatory Visit: Payer: Self-pay | Admitting: Internal Medicine

## 2012-09-15 ENCOUNTER — Other Ambulatory Visit: Payer: Self-pay | Admitting: Internal Medicine

## 2013-01-08 ENCOUNTER — Telehealth: Payer: Self-pay

## 2013-01-08 NOTE — Telephone Encounter (Signed)
Patient informed. 

## 2013-01-08 NOTE — Telephone Encounter (Signed)
Patient has lost her job and has no insurance.  She would like to know if we have samples of nexium or if ok to take OTC prilosec two daily.  Please advise

## 2013-01-08 NOTE — Telephone Encounter (Signed)
The otc prilosec 20 - 1 qd is about the same as nexium and prob less expensive

## 2013-03-14 ENCOUNTER — Telehealth: Payer: Self-pay

## 2013-03-14 MED ORDER — LISINOPRIL 40 MG PO TABS: 40.0000 mg | ORAL_TABLET | Freq: Every day | ORAL | Status: DC

## 2013-03-14 NOTE — Telephone Encounter (Signed)
Refill to [facility]

## 2013-03-15 ENCOUNTER — Other Ambulatory Visit: Payer: Self-pay | Admitting: Gastroenterology

## 2013-03-26 ENCOUNTER — Other Ambulatory Visit: Payer: Self-pay | Admitting: Gastroenterology

## 2013-08-10 ENCOUNTER — Other Ambulatory Visit: Payer: Self-pay | Admitting: Internal Medicine

## 2013-09-10 ENCOUNTER — Other Ambulatory Visit: Payer: Self-pay | Admitting: Internal Medicine

## 2013-10-04 ENCOUNTER — Encounter: Payer: Self-pay | Admitting: Internal Medicine

## 2013-10-04 ENCOUNTER — Ambulatory Visit (INDEPENDENT_AMBULATORY_CARE_PROVIDER_SITE_OTHER): Payer: Self-pay | Admitting: Internal Medicine

## 2013-10-04 VITALS — BP 120/80 | HR 73 | Temp 98.5°F | Ht 62.0 in | Wt 159.0 lb

## 2013-10-04 DIAGNOSIS — I10 Essential (primary) hypertension: Secondary | ICD-10-CM

## 2013-10-04 MED ORDER — SIMVASTATIN 20 MG PO TABS: ORAL_TABLET | ORAL | Status: DC

## 2013-10-04 MED ORDER — AMLODIPINE BESYLATE 5 MG PO TABS: ORAL_TABLET | ORAL | Status: DC

## 2013-10-04 MED ORDER — LISINOPRIL 40 MG PO TABS: 40.0000 mg | ORAL_TABLET | Freq: Every day | ORAL | Status: DC

## 2013-10-04 NOTE — Assessment & Plan Note (Signed)
stable overall by history and exam, recent data reviewed with pt, and pt to continue medical treatment as before,  to f/u any worsening symptoms or concerns BP Readings from Last 3 Encounters:  10/04/13 120/80  07/10/12 120/78  05/11/12 122/80

## 2013-10-04 NOTE — Progress Notes (Signed)
  Subjective:    Patient ID: Sandy Hanna, female    DOB: [birth year], 47 y.o.   MRN: [id number]  HPI  Here to f/u; overall doing ok,  Pt denies chest pain, increased sob or doe, wheezing, orthopnea, PND, increased LE swelling, palpitations, dizziness or syncope.  Pt denies polydipsia, polyuria, or low sugar symptoms such as weakness or confusion improved with po intake.  Pt denies new neurological symptoms such as new headache, or facial or extremity weakness or numbness.   Pt states overall good compliance with meds, has been trying to follow lower cholesterol  diet, Lost 9 lbs with more walking this past yr.  Has a new job. Wants to return in 3 mo for CPX after her benefits start Past Medical History  Diagnosis Date  . Acute sinusitis, unspecified 12/30/2007  . ALLERGIC RHINITIS 12/15/2007  . ANEMIA-NOS 12/18/2010  . ANXIETY 12/15/2007  . BLEPHARITIS, RIGHT 04/03/2008  . CHEST PAIN 08/25/2010  . Migraines 12/23/2008  . DEPRESSION 12/15/2007  . FATIGUE 08/25/2010  . GERD 12/15/2007  . HYPERLIPIDEMIA 07/03/2010  . HYPERSOMNIA 12/18/2010  . HYPERTENSION 06/27/2007  . Irritable bowel syndrome 04/28/2009  . ISCHEMIC COLITIS 04/28/2009  . OTITIS MEDIA, BILATERAL 09/12/2008  . RECTAL BLEEDING 04/28/2009   Past Surgical History  Procedure Laterality Date  . Breast biopsy  2005    Negative    reports that she has been smoking Cigarettes.  She has been smoking about 0.30 packs per day. She has never used smokeless tobacco. She reports that she does not drink alcohol or use illicit drugs. family history includes Breast cancer in her mother; Diabetes in her mother and paternal grandmother; Hypertension in her mother; Ovarian cancer in an other family member. Allergies  Allergen Reactions  . Amoxicillin-Pot Clavulanate     REACTION: nausea  . Clarithromycin     REACTION: Nausea  . Doxycycline     REACTION: severe fatigue per pt   No current outpatient prescriptions on file prior to visit.   No current  facility-administered medications on file prior to visit.    Review of Systems All otherwise neg per pt     Objective:   Physical Exam BP 120/80  Pulse 73  Temp(Src) 98.5 F (36.9 C) (Oral)  Ht 5\' 2"  (1.575 m)  Wt 159 lb (72.122 kg)  BMI 29.07 kg/m2  SpO2 98% VS noted,  Constitutional: Pt appears well-developed and well-nourished.  HENT: Head: NCAT.  Right Ear: External ear normal.  Left Ear: External ear normal.  Eyes: Conjunctivae and EOM are normal. Pupils are equal, round, and reactive to light.  Neck: Normal range of motion. Neck supple.  Cardiovascular: Normal rate and regular rhythm.   Pulmonary/Chest: Effort normal and breath sounds normal.  Abd:  Soft, NT, non-distended, + BS Neurological: Pt is alert. Not confused  Skin: Skin is warm. No erythema.  Psychiatric: Pt behavior is normal. Thought content normal.     Assessment & Plan:

## 2013-10-04 NOTE — Progress Notes (Signed)
Pre-visit discussion using our clinic review tool. No additional management support is needed unless otherwise documented below in the visit note.  

## 2013-10-04 NOTE — Patient Instructions (Signed)
Please continue all other medications as before, and refills have been done if requested  Please have the pharmacy call with any other refills you may need.  Please continue your efforts at being more active, low cholesterol diet, and weight control.  Please remember to sign up for My Chart if you have not done so, as this will be important to you in the future with finding out test results, communicating by private email, and scheduling acute appointments online when needed.  Please return in 3 months, or sooner if needed

## 2013-12-05 ENCOUNTER — Ambulatory Visit: Payer: Self-pay | Admitting: Internal Medicine

## 2013-12-24 ENCOUNTER — Other Ambulatory Visit: Payer: Self-pay | Admitting: Internal Medicine

## 2013-12-24 DIAGNOSIS — R921 Mammographic calcification found on diagnostic imaging of breast: Secondary | ICD-10-CM

## 2014-01-03 ENCOUNTER — Ambulatory Visit
Admission: RE | Admit: 2014-01-03 | Discharge: 2014-01-03 | Disposition: A | Discharge status: Home / Self Care | Payer: Self-pay | Source: Ambulatory Visit | Attending: Internal Medicine | Admitting: Internal Medicine

## 2014-01-03 DIAGNOSIS — R921 Mammographic calcification found on diagnostic imaging of breast: Secondary | ICD-10-CM

## 2014-01-04 LAB — HM MAMMOGRAPHY

## 2014-01-09 ENCOUNTER — Ambulatory Visit (INDEPENDENT_AMBULATORY_CARE_PROVIDER_SITE_OTHER): Payer: BC Managed Care – PPO | Admitting: Internal Medicine

## 2014-01-09 ENCOUNTER — Encounter: Payer: Self-pay | Admitting: Internal Medicine

## 2014-01-09 VITALS — BP 132/80 | HR 82 | Temp 97.3°F | Ht 62.0 in | Wt 162.8 lb

## 2014-01-09 DIAGNOSIS — Z0000 Encounter for general adult medical examination without abnormal findings: Secondary | ICD-10-CM

## 2014-01-09 MED ORDER — LISINOPRIL 40 MG PO TABS
40.0000 mg | ORAL_TABLET | Freq: Every day | ORAL | Status: DC
Start: 2014-01-09 — End: 2015-01-19

## 2014-01-09 MED ORDER — ASPIRIN EC 81 MG PO TBEC: 81.0000 mg | DELAYED_RELEASE_TABLET | Freq: Every day | ORAL | Status: DC

## 2014-01-09 MED ORDER — AMLODIPINE BESYLATE 5 MG PO TABS: ORAL_TABLET | ORAL | Status: DC

## 2014-01-09 MED ORDER — SIMVASTATIN 20 MG PO TABS: ORAL_TABLET | ORAL | Status: DC

## 2014-01-09 MED ORDER — METHOCARBAMOL 500 MG PO TABS: 500.0000 mg | ORAL_TABLET | Freq: Four times a day (QID) | ORAL | Status: DC | PRN

## 2014-01-09 NOTE — Patient Instructions (Signed)
Please take all new medication as prescribed - the aspirin at 81 mg per day Please take all new medication as prescribed - the robaxin muscle relaxer as needed Please continue all other medications as before, and refills have been done if requested. Please have the pharmacy call with any other refills you may need. Please continue your efforts at being more active, low cholesterol diet, and weight control. You are otherwise up to date with prevention measures today. Please keep your appointments with your specialists as you have planned  Please go to the LAB in the Basement (turn left off the elevator) for the tests to be done at your convenience  You will be contacted by phone if any changes need to be made immediately.  Otherwise, you will receive a letter about your results with an explanation, but please check with MyChart first.  Please return in 1 year for your yearly visit, or sooner if needed

## 2014-01-09 NOTE — Progress Notes (Signed)
Subjective:    Patient ID: Sandy Hanna, female    DOB: 1966-04-27, 48 y.o.   MRN: [id number]  HPI Here for wellness and f/u;  Overall doing ok;  Pt denies CP, worsening SOB, DOE, wheezing, orthopnea, PND, worsening LE edema, palpitations, dizziness or syncope.  Pt denies neurological change such as new headache, facial or extremity weakness.  Pt denies polydipsia, polyuria, or low sugar symptoms. Pt states overall good compliance with treatment and medications, good tolerability, and has been trying to follow lower cholesterol diet.  Pt denies worsening depressive symptoms, suicidal ideation or panic. No fever, night sweats, wt loss, loss of appetite, or other constitutional symptoms.  Pt states good ability with ADL's, has low fall risk, home safety reviewed and adequate, no other significant changes in hearing or vision, and only occasionally active with exercise.  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  Trying to quit smoking, now down to 4-5 cigs per day, plans to start nicotine patch.  Asks for robaxin prn as this helped with recurring neck spasms before Past Medical History  Diagnosis Date  . Acute sinusitis, unspecified 12/30/2007  . ALLERGIC RHINITIS 12/15/2007  . ANEMIA-NOS 12/18/2010  . ANXIETY 12/15/2007  . BLEPHARITIS, RIGHT 04/03/2008  . CHEST PAIN 08/25/2010  . Migraines 12/23/2008  . DEPRESSION 12/15/2007  . FATIGUE 08/25/2010  . GERD 12/15/2007  . HYPERLIPIDEMIA 07/03/2010  . HYPERSOMNIA 12/18/2010  . HYPERTENSION 06/27/2007  . Irritable bowel syndrome 04/28/2009  . ISCHEMIC COLITIS 04/28/2009  . OTITIS MEDIA, BILATERAL 09/12/2008  . RECTAL BLEEDING 04/28/2009   Past Surgical History  Procedure Laterality Date  . Breast biopsy  2005    Negative    reports that she has been smoking Cigarettes.  She has been smoking about 0.30 packs per day. She has never used smokeless tobacco. She reports that she does not drink alcohol or use illicit drugs. family history includes Breast cancer in her mother;  Diabetes in her mother and paternal grandmother; Hypertension in her mother; Ovarian cancer in an other family member. Allergies  Allergen Reactions  . Amoxicillin-Pot Clavulanate     REACTION: nausea  . Clarithromycin     REACTION: Nausea  . Doxycycline     REACTION: severe fatigue per pt   No current outpatient prescriptions on file prior to visit.   No current facility-administered medications on file prior to visit.   Review of Systems Constitutional: Negative for diaphoresis, activity change, appetite change or unexpected weight change.  HENT: Negative for hearing loss, ear pain, facial swelling, mouth sores and neck stiffness.   Eyes: Negative for pain, redness and visual disturbance.  Respiratory: Negative for shortness of breath and wheezing.   Cardiovascular: Negative for chest pain and palpitations.  Gastrointestinal: Negative for diarrhea, blood in stool, abdominal distention or other pain Genitourinary: Negative for hematuria, flank pain or change in urine volume.  Musculoskeletal: Negative for myalgias and joint swelling.  Skin: Negative for color change and wound.  Neurological: Negative for syncope and numbness. other than noted Hematological: Negative for adenopathy.  Psychiatric/Behavioral: Negative for hallucinations, self-injury, decreased concentration and agitation.      Objective:   Physical Exam BP 132/80  Pulse 82  Temp(Src) 97.3 F (36.3 C) (Oral)  Ht 5\' 2"  (1.575 m)  Wt 162 lb 12 oz (73.823 kg)  BMI 29.76 kg/m2  SpO2 98% VS noted,  Constitutional: Pt is oriented to person, place, and time. Appears well-developed and well-nourished.  Head: Normocephalic and atraumatic.  Right Ear: External ear  normal.  Left Ear: External ear normal.  Nose: Nose normal.  Mouth/Throat: Oropharynx is clear and moist.  Eyes: Conjunctivae and EOM are normal. Pupils are equal, round, and reactive to light.  Neck: Normal range of motion. Neck supple. No JVD present. No  tracheal deviation present.  Cardiovascular: Normal rate, regular rhythm, normal heart sounds and intact distal pulses.   Pulmonary/Chest: Effort normal and breath sounds normal.  Abdominal: Soft. Bowel sounds are normal. There is no tenderness. No HSM  Musculoskeletal: Normal range of motion. Exhibits no edema.  Lymphadenopathy:  Has no cervical adenopathy.  Neurological: Pt is alert and oriented to person, place, and time. Pt has normal reflexes. No cranial nerve deficit.  Skin: Skin is warm and dry. No rash noted.  Psychiatric:  Has  normal mood and affect. Behavior is normal.     Assessment & Plan:

## 2014-01-09 NOTE — Assessment & Plan Note (Signed)

## 2014-01-10 ENCOUNTER — Telehealth: Payer: Self-pay | Admitting: *Deleted

## 2014-01-10 MED ORDER — AZITHROMYCIN 250 MG PO TABS: ORAL_TABLET | ORAL | Status: DC

## 2014-01-10 NOTE — Telephone Encounter (Signed)
Ok for z pack

## 2014-01-10 NOTE — Telephone Encounter (Signed)
Patient informed. 

## 2014-01-10 NOTE — Telephone Encounter (Signed)
Patient phoned that when she awoke this morning, she had sore throat, earache, and her sinuses were hurting.  States this happens all the time and is requesting PCP to phone something in.  Please advise.  CB# [phone]

## 2014-01-25 ENCOUNTER — Encounter: Payer: Self-pay | Admitting: Nurse Practitioner

## 2014-01-25 ENCOUNTER — Ambulatory Visit (INDEPENDENT_AMBULATORY_CARE_PROVIDER_SITE_OTHER): Payer: BC Managed Care – PPO | Admitting: Nurse Practitioner

## 2014-01-25 VITALS — BP 100/74 | HR 67 | Temp 98.2°F | Ht 62.0 in | Wt 165.0 lb

## 2014-01-25 DIAGNOSIS — J019 Acute sinusitis, unspecified: Secondary | ICD-10-CM

## 2014-01-25 DIAGNOSIS — H00019 Hordeolum externum unspecified eye, unspecified eyelid: Secondary | ICD-10-CM

## 2014-01-25 DIAGNOSIS — H04309 Unspecified dacryocystitis of unspecified lacrimal passage: Secondary | ICD-10-CM

## 2014-01-25 MED ORDER — ERYTHROMYCIN 5 MG/GM OP OINT: TOPICAL_OINTMENT | OPHTHALMIC | Status: DC

## 2014-01-25 MED ORDER — AZITHROMYCIN 250 MG PO TABS: ORAL_TABLET | ORAL | Status: DC

## 2014-01-25 NOTE — Progress Notes (Signed)
   Subjective:    Patient ID: Sandy Hanna, female    DOB: 01/18/1966, 48 y.o.   MRN: [id number]  HPI Comments: Also c/o swollen red inner canthus of eye.  Sinusitis This is a recurrent problem. The problem is unchanged. There has been no fever. She is experiencing no pain. Associated symptoms include congestion, ear pain, headaches, a hoarse voice and sinus pressure. Pertinent negatives include no chills, coughing, shortness of breath or sore throat. Past treatments include nothing.      Review of Systems  Constitutional: Negative for fever, chills, activity change, appetite change and fatigue.  HENT: Positive for congestion, ear pain, hoarse voice, sinus pressure and voice change. Negative for sore throat.   Eyes: Positive for pain, redness and itching. Negative for discharge and visual disturbance.  Respiratory: Negative for cough, chest tightness, shortness of breath and wheezing.   Cardiovascular: Negative for chest pain.  Musculoskeletal: Negative for back pain.  Neurological: Positive for headaches.  Hematological: Negative for adenopathy.       Objective:   Physical Exam  Vitals reviewed. Constitutional: She is oriented to person, place, and time. She appears well-developed and well-nourished.  HENT:  Head: Normocephalic and atraumatic.  Right Ear: External ear normal.  Left Ear: External ear normal.  Mouth/Throat: Oropharynx is clear and moist. No oropharyngeal exudate.  Eyes: Conjunctivae are normal. Right eye exhibits no discharge. Left eye exhibits no discharge.  Lower L eye lid at inner canthus slightly red & swollen, tender to palpate  Neck: Normal range of motion. Neck supple. No thyromegaly present.  Cardiovascular: Normal rate, regular rhythm and normal heart sounds.   No murmur heard. Pulmonary/Chest: Effort normal and breath sounds normal. No respiratory distress. She has no wheezes. She has no rales.  Lymphadenopathy:    She has no cervical adenopathy.    Neurological: She is alert and oriented to person, place, and time.  Skin: Skin is warm and dry.  Psychiatric: She has a normal mood and affect. Her behavior is normal. Thought content normal.          Assessment & Plan:  1. Tear duct infection L eye - erythromycin ophthalmic ointment; Apply ribbon across lower lid twice daily.  Dispense: 3.5 g; Refill: 0 See pt instructions 2. Sinusitis, acute Think allergenic, start sinus rinses. Hold ABX, start if no improvement - azithromycin (ZITHROMAX) 250 MG tablet; Take 2T po day 1, then 1T po days 2-5.  Dispense: 6 tablet; Refill: 0

## 2014-01-25 NOTE — Progress Notes (Signed)
Pre visit review using our clinic review tool, if applicable. No additional management support is needed unless otherwise documented below in the visit note. 

## 2014-01-25 NOTE — Patient Instructions (Signed)
I think your nasal symptoms are allergenic. Please start daily sinus rinses (Neilmed Sinus Rinse). If no improvement, start antibiotic. Use ointment in eye. Also use qtip to help express trapped fluid out of tear duct. Stop using eye liner to inner corner of eye. I think it is the source of recurrent infection. Use warm compresses as often as able-hot as you can stand comfortably.  Nice to meet you!  Lacrimal Duct Obstruction The tear duct (lacrimal duct) is a small duct that drains from the inner corner of the eye into the nose. This is why your nose runs when your eyes are watering. The path to the tear duct begins as two small tubes  one at the inner corner of each eyelid called canaliculi, which join at the lacrimal sac. Obstruction can happen in either canaliculus, in the lacrimal sac or the lacrimal duct. The blockage causes tears to overflow and run down the cheek instead of draining normally into the nose. Simple obstruction that causes tearing is common. It is more annoying than harmful. This condition is most common in infants. This is because their tear ducts are not fully developed and clog easily. As a result, babies may have episodes of tearing and infection. However, in most cases, the problem gets better as the child grows. If infection happens, a red and swollen lump may appear between the inner corner of the eye, near the lower lid and the nose. This is a more serious condition called Dacryocystitis. SYMPTOMS   Constant welling up of tears in the affected eye.  Tearing that runs over the edge of the lower lid and down the cheek. DIAGNOSIS  In adults, diagnosis is made based upon the history of symptoms. A diagnosis is also made by placing a small amount of green dye (fluorescein) in the affected eye. Then, the patient will blow their nose after a few moments. If no dye appears on the tissue, it suggests that the tears are not getting through to the nose. In children, it is often  necessary to make the diagnosis with probing of the ducts. This is done under general anesthesia. TREATMENT  Adults  If this condition does not respond to antibiotic eye drops, it usually requires probing and irrigating of the tear drainage system. This can clear any obstruction that may be present. This can be done in the office and without medicine to numb the area.  Sometimes, the obstruction is due to a narrowing (stenosis) of the openings to the canaliculi on the lids, the small openings may require that they be made larger (dilated) as well.  In more severe cases, permanent tubes can be put into the canaliculi to help the tears drain to the nose.  In very severe cases, surgery may need to be performed to create a direct opening from the tear sac into the nose (Dacryocystorhinostomy). Infants  The problem often goes away within the first one half year of life. Gently massaging the area between the eye and the nose down towards the nose often makes the condition get better faster.  Your ophthalmologist may also prescribe some antibiotic drops to rid the ducts of any bacteria.  If there are no results from these above measures, it may be necessary to have the tear drainage system probed to open them up. In infants, this is usually done quickly and under a general anesthetic. SEEK IMMEDIATE MEDICAL CARE IF:   If you or your child develop increased pain, swelling, redness, or drainage from the  eye.  If you or your child develop signs of generalized infection including muscle aches, chills, fever, or a general ill feeling.  If an oral temperature above 102 F (38.9 C) develops, not controlled by medication. Return for a recheck as instructed, or sooner if you develop any of the symptoms (problems) described above. If antibiotics were prescribed, take them as directed. Document Released: 02/11/2006 Document Revised: 01/31/2012 Document Reviewed: 01/08/2008 [facility] Patient Information  2014 [place], [place].

## 2014-01-29 ENCOUNTER — Ambulatory Visit: Payer: BC Managed Care – PPO | Admitting: Internal Medicine

## 2014-01-29 DIAGNOSIS — Z0289 Encounter for other administrative examinations: Secondary | ICD-10-CM

## 2014-05-17 ENCOUNTER — Ambulatory Visit: Payer: BC Managed Care – PPO | Admitting: Internal Medicine

## 2014-06-20 ENCOUNTER — Telehealth: Payer: Self-pay | Admitting: *Deleted

## 2014-06-20 ENCOUNTER — Other Ambulatory Visit: Payer: Self-pay | Admitting: *Deleted

## 2014-06-20 MED ORDER — ESOMEPRAZOLE MAGNESIUM 40 MG PO CPDR: 40.0000 mg | DELAYED_RELEASE_CAPSULE | Freq: Every day | ORAL | Status: DC

## 2014-06-20 NOTE — Telephone Encounter (Signed)
Pt states she would like a refill on her nexium. The ovc nexium does not work for her she would have to take 3 a day. Inform [facility] to [facility]..../lmb

## 2014-06-29 ENCOUNTER — Encounter: Payer: Self-pay | Admitting: Family Medicine

## 2014-06-29 ENCOUNTER — Ambulatory Visit (INDEPENDENT_AMBULATORY_CARE_PROVIDER_SITE_OTHER): Payer: BC Managed Care – PPO | Admitting: Family Medicine

## 2014-06-29 VITALS — BP 128/80 | Temp 98.4°F | Wt 166.0 lb

## 2014-06-29 DIAGNOSIS — J209 Acute bronchitis, unspecified: Secondary | ICD-10-CM

## 2014-06-29 DIAGNOSIS — J069 Acute upper respiratory infection, unspecified: Secondary | ICD-10-CM

## 2014-06-29 MED ORDER — ALBUTEROL SULFATE 108 (90 BASE) MCG/ACT IN AEPB: 2.0000 | INHALATION_SPRAY | RESPIRATORY_TRACT | Status: DC | PRN

## 2014-06-29 NOTE — Progress Notes (Signed)
OFFICE NOTE  06/29/2014  CC:  Chief Complaint  Patient presents with  . Headache    burining in back, dizziness started on Wednesday    HPI: Patient is a 48 y.o. African-American female who is here for resp complaints.  Onset 3-4 days ago, nasal congestion, mild cough, HA at onset but this is gone.  Ears a little sore.  Throat raspy.  Aching in chest/back, described as burning in back and torso that is worse with coughing.  Tm 99 but subjective f/c. Allergy med+ muscle relaxer taken.  Not eating/drinking well since mid May 29, 2023 when her GM passed away.  She is a smoker.  No known sick contacts.  Pertinent PMH:  PMH and PSH reviewed.  MEDS:  Outpatient Prescriptions Prior to Visit  Medication Sig Dispense Refill  . amLODipine (NORVASC) 5 MG tablet TAKE ONE TABLET BY MOUTH ONCE DAILY  90 tablet  3  . aspirin EC 81 MG tablet Take 1 tablet (81 mg total) by mouth daily.  90 tablet  11  . erythromycin ophthalmic ointment Apply ribbon across lower lid twice daily.  3.5 g  0  . esomeprazole (NEXIUM) 40 MG capsule Take 1 capsule (40 mg total) by mouth daily.  90 capsule  2  . lisinopril (PRINIVIL,ZESTRIL) 40 MG tablet Take 1 tablet (40 mg total) by mouth daily.  90 tablet  3  . methocarbamol (ROBAXIN) 500 MG tablet Take 1 tablet (500 mg total) by mouth every 6 (six) hours as needed for muscle spasms.  60 tablet  2  . simvastatin (ZOCOR) 20 MG tablet TAKE ONE TABLET BY MOUTH ONCE DAILY  90 tablet  3  . azithromycin (ZITHROMAX) 250 MG tablet Take 2T po day 1, then 1T po days 2-5.  6 tablet  0   No facility-administered medications prior to visit.    PE: Blood pressure 128/80, temperature 98.4 F (36.9 C), temperature source Oral, weight 166 lb (75.297 kg). VS: noted--normal. Gen: alert, NAD, NONTOXIC APPEARING. HEENT: eyes without injection, drainage, or swelling.  Ears: EACs clear, TMs with normal light reflex and landmarks.  Nose: Clear rhinorrhea, with some dried, crusty exudate adherent to  mildly injected mucosa.  No purulent d/c.  No paranasal sinus TTP.  No facial swelling.  Throat and mouth without focal lesion.  No pharyngial swelling, erythema, or exudate.   Neck: supple, no LAD.   LUNGS: CTA bilat, nonlabored resps.  With fo CV: RRR, no m/r/g. EXT: no c/c/e SKIN: no rash  IMPRESSION AND PLAN:  URI with acute asthmatic bronchitis. Good aeration but I think she would benefit from prn bronchodilator. ProAir respiclick DPI dispensed today after nurse did inhaler education.  An After Visit Summary was printed and given to the patient.  FOLLOW UP: prn

## 2014-06-29 NOTE — Progress Notes (Signed)
Pre visit review using our clinic review tool, if applicable. No additional management support is needed unless otherwise documented below in the visit note. 

## 2014-07-03 ENCOUNTER — Telehealth: Payer: Self-pay | Admitting: Internal Medicine

## 2014-07-03 NOTE — Telephone Encounter (Signed)
Patient Information:  Caller Name: Aminata  Phone: [phone]  Patient: Sandy, Hanna  Gender: Female  DOB: 12-08-1965  Age: 47 Years  PCP: [name] (Adults only)  Pregnant: No  Office Follow Up:  Does the office need to follow up with this patient?: Yes  Instructions For The Office: Please call.  RN Note:  Pt reports a feeling of numbness on the L side of her body, a pins and needle sensation. No true numbness or any pain. Speak is clear. She does have an appointment scheduled for 07/04/14 @ 1815 for sinus issues.   Symptoms  Reason For Call & Symptoms: L sided numbness  Reviewed Health History In EMR: Yes  Reviewed Medications In EMR: Yes  Reviewed Allergies In EMR: Yes  Reviewed Surgeries / Procedures: Yes  Date of Onset of Symptoms: 07/02/2014 OB / GYN:  LMP: 06/13/2014  Guideline(s) Used:  Neurologic Deficit  Disposition Per Guideline:   Go to Office Now  Reason For Disposition Reached:   Tingling (e.g., pins and needles) of the face, arm or leg on one side of the body, that is present now  Advice Given:  Call Back If:  You become worse.  Patient Will Follow Care Advice:  YES

## 2014-07-04 ENCOUNTER — Ambulatory Visit: Payer: BC Managed Care – PPO | Admitting: Internal Medicine

## 2014-07-04 NOTE — Telephone Encounter (Signed)
Ok to f/u at that time

## 2014-07-05 NOTE — Telephone Encounter (Signed)
Called pt to check on her, and lmom to call the office back

## 2014-10-01 ENCOUNTER — Ambulatory Visit: Payer: BC Managed Care – PPO | Admitting: Internal Medicine

## 2014-10-04 ENCOUNTER — Telehealth: Payer: Self-pay | Admitting: Internal Medicine

## 2014-10-04 NOTE — Telephone Encounter (Signed)
Pt call stated that she is in a quit smoking program and she was wondering if Dr. Jenny Reichmann can give her Rx for patch that will help her stop smoking. Please advise.

## 2014-10-04 NOTE — Telephone Encounter (Signed)
Patches are normally obtained OTC at the local pharmacy, thanks

## 2014-10-08 NOTE — Telephone Encounter (Signed)
Patient informed. 

## 2014-10-16 ENCOUNTER — Encounter: Payer: Self-pay | Admitting: Internal Medicine

## 2014-10-16 ENCOUNTER — Ambulatory Visit (INDEPENDENT_AMBULATORY_CARE_PROVIDER_SITE_OTHER): Payer: BC Managed Care – PPO | Admitting: Internal Medicine

## 2014-10-16 VITALS — BP 122/82 | HR 77 | Temp 99.0°F | Ht 62.0 in | Wt 170.0 lb

## 2014-10-16 DIAGNOSIS — J019 Acute sinusitis, unspecified: Secondary | ICD-10-CM | POA: Insufficient documentation

## 2014-10-16 DIAGNOSIS — K589 Irritable bowel syndrome without diarrhea: Secondary | ICD-10-CM

## 2014-10-16 DIAGNOSIS — Z0189 Encounter for other specified special examinations: Secondary | ICD-10-CM

## 2014-10-16 DIAGNOSIS — Z0000 Encounter for general adult medical examination without abnormal findings: Secondary | ICD-10-CM

## 2014-10-16 DIAGNOSIS — J018 Other acute sinusitis: Secondary | ICD-10-CM

## 2014-10-16 DIAGNOSIS — I10 Essential (primary) hypertension: Secondary | ICD-10-CM

## 2014-10-16 MED ORDER — BENZONATATE 100 MG PO CAPS
ORAL_CAPSULE | ORAL | Status: DC
Start: 2014-10-16 — End: 2014-12-20

## 2014-10-16 MED ORDER — AZITHROMYCIN 250 MG PO TABS: ORAL_TABLET | ORAL | Status: DC

## 2014-10-16 MED ORDER — HYOSCYAMINE SULFATE 0.125 MG PO TABS: 0.1250 mg | ORAL_TABLET | ORAL | Status: DC | PRN

## 2014-10-16 NOTE — Patient Instructions (Addendum)
Please take all new medication as prescribed - the antibiotic, cough pills, and the medication for the IBS  Please continue all other medications as before, and refills have been done if requested.  Please have the pharmacy call with any other refills you may need.  Please keep your appointments with your specialists as you may have planned  Please return in 3 months, or sooner if needed, with Lab testing done 3-5 days before

## 2014-10-16 NOTE — Progress Notes (Signed)
Pre visit review using our clinic review tool, if applicable. No additional management support is needed unless otherwise documented below in the visit note. 

## 2014-10-16 NOTE — Progress Notes (Signed)
Subjective:    Patient ID: Sandy Hanna, female    DOB: 08/25/1966, 48 y.o.   MRN: [id number]  HPI   Here with 2-3 days acute onset fever, facial pain, pressure, headache, general weakness and malaise, and greenish d/c, with mild ST and cough, but pt denies chest pain, wheezing, increased sob or doe, orthopnea, PND, increased LE swelling, palpitations, dizziness or syncope. Also with recurring IBS symptoms or pain, bloating, occas loose stools, without n/v, blood, fever, wt loss. Past Medical History  Diagnosis Date  . Acute sinusitis, unspecified 12/30/2007  . ALLERGIC RHINITIS 12/15/2007  . ANEMIA-NOS 12/18/2010  . ANXIETY 12/15/2007  . BLEPHARITIS, RIGHT 04/03/2008  . CHEST PAIN 08/25/2010  . Migraines 12/23/2008  . DEPRESSION 12/15/2007  . FATIGUE 08/25/2010  . GERD 12/15/2007  . HYPERLIPIDEMIA 07/03/2010  . HYPERSOMNIA 12/18/2010  . HYPERTENSION 06/27/2007  . Irritable bowel syndrome 04/28/2009  . ISCHEMIC COLITIS 04/28/2009  . OTITIS MEDIA, BILATERAL 09/12/2008  . RECTAL BLEEDING 04/28/2009   Past Surgical History  Procedure Laterality Date  . Breast biopsy  2005    Negative    reports that she has been smoking Cigarettes.  She has been smoking about 0.30 packs per day. She has never used smokeless tobacco. She reports that she does not drink alcohol or use illicit drugs. family history includes Breast cancer in her mother; Diabetes in her mother and paternal grandmother; Hypertension in her mother; Ovarian cancer in an other family member. Allergies  Allergen Reactions  . Amoxicillin-Pot Clavulanate     REACTION: nausea  . Clarithromycin     REACTION: Nausea  . Doxycycline     REACTION: severe fatigue per pt   Current Outpatient Prescriptions on File Prior to Visit  Medication Sig Dispense Refill  . Albuterol Sulfate (PROAIR RESPICLICK) 735 (90 BASE) MCG/ACT AEPB Inhale 2 puffs into the lungs every 4 (four) hours as needed. 1 each 0  . amLODipine (NORVASC) 5 MG tablet TAKE ONE  TABLET BY MOUTH ONCE DAILY 90 tablet 3  . aspirin EC 81 MG tablet Take 1 tablet (81 mg total) by mouth daily. 90 tablet 11  . erythromycin ophthalmic ointment Apply ribbon across lower lid twice daily. 3.5 g 0  . esomeprazole (NEXIUM) 40 MG capsule Take 1 capsule (40 mg total) by mouth daily. 90 capsule 2  . lisinopril (PRINIVIL,ZESTRIL) 40 MG tablet Take 1 tablet (40 mg total) by mouth daily. 90 tablet 3  . methocarbamol (ROBAXIN) 500 MG tablet Take 1 tablet (500 mg total) by mouth every 6 (six) hours as needed for muscle spasms. 60 tablet 2  . simvastatin (ZOCOR) 20 MG tablet TAKE ONE TABLET BY MOUTH ONCE DAILY 90 tablet 3   No current facility-administered medications on file prior to visit.   Review of Systems  Constitutional: Negative for unusual diaphoresis or other sweats  HENT: Negative for ringing in ear Eyes: Negative for double vision or worsening visual disturbance.  Respiratory: Negative for choking and stridor.   Gastrointestinal: Negative for vomiting or other signifcant bowel change Genitourinary: Negative for hematuria or decreased urine volume.  Musculoskeletal: Negative for other MSK pain or swelling Skin: Negative for color change and worsening wound.  Neurological: Negative for tremors and numbness other than noted  Psychiatric/Behavioral: Negative for decreased concentration or agitation other than above       Objective:   Physical Exam BP 122/82 mmHg  Pulse 77  Temp(Src) 99 F (37.2 C) (Oral)  Ht 5\' 2"  (1.575 m)  Wt  170 lb (77.111 kg)  BMI 31.09 kg/m2  SpO2 99% VS noted, mild ill Constitutional: Pt appears well-developed, well-nourished.  HENT: Head: NCAT.  Right Ear: External ear normal.  Left Ear: External ear normal.  Eyes: . Pupils are equal, round, and reactive to light. Conjunctivae and EOM are normal Bilat tm's with mild erythema.  Max sinus areas mild tender.  Pharynx with mild erythema, no exudate Neck: Normal range of motion. Neck supple.    Cardiovascular: Normal rate and regular rhythm.   Pulmonary/Chest: Effort normal and breath sounds normal.  Abd:  Soft, NT, ND, + BS Neurological: Pt is alert. Not confused , motor grossly intact Skin: Skin is warm. No rash Psychiatric: Pt behavior is normal. No agitation.     Assessment & Plan:

## 2014-10-16 NOTE — Assessment & Plan Note (Signed)
Mild to mod, for antibx course,  to f/u any worsening symptoms or concerns 

## 2014-10-16 NOTE — Assessment & Plan Note (Signed)
stable overall by history and exam, recent data reviewed with pt, and pt to continue medical treatment as before,  to f/u any worsening symptoms or concerns BP Readings from Last 3 Encounters:  10/16/14 122/82  06/29/14 128/80  01/25/14 100/74

## 2014-10-16 NOTE — Assessment & Plan Note (Signed)
[place] for hyoscyamine prn,  to f/u any worsening symptoms or concerns

## 2014-10-22 ENCOUNTER — Ambulatory Visit: Payer: BC Managed Care – PPO | Admitting: Internal Medicine

## 2014-12-20 ENCOUNTER — Encounter: Payer: Self-pay | Admitting: Internal Medicine

## 2014-12-20 ENCOUNTER — Ambulatory Visit (INDEPENDENT_AMBULATORY_CARE_PROVIDER_SITE_OTHER): Payer: BLUE CROSS/BLUE SHIELD | Admitting: Internal Medicine

## 2014-12-20 ENCOUNTER — Other Ambulatory Visit: Payer: BLUE CROSS/BLUE SHIELD

## 2014-12-20 VITALS — BP 142/88 | HR 83 | Temp 98.8°F | Ht 62.0 in | Wt 167.1 lb

## 2014-12-20 DIAGNOSIS — R1010 Upper abdominal pain, unspecified: Secondary | ICD-10-CM

## 2014-12-20 DIAGNOSIS — R109 Unspecified abdominal pain: Secondary | ICD-10-CM | POA: Insufficient documentation

## 2014-12-20 DIAGNOSIS — I10 Essential (primary) hypertension: Secondary | ICD-10-CM

## 2014-12-20 DIAGNOSIS — F329 Major depressive disorder, single episode, unspecified: Secondary | ICD-10-CM

## 2014-12-20 DIAGNOSIS — R1030 Lower abdominal pain, unspecified: Secondary | ICD-10-CM

## 2014-12-20 DIAGNOSIS — F32A Depression, unspecified: Secondary | ICD-10-CM

## 2014-12-20 LAB — POCT URINALYSIS DIPSTICK
BILIRUBIN UA: NEGATIVE
Glucose, UA: NEGATIVE
KETONES UA: NEGATIVE
Leukocytes, UA: NEGATIVE
Nitrite, UA: NEGATIVE
Protein, UA: NEGATIVE
Spec Grav, UA: 1.02
Urobilinogen, UA: NEGATIVE
pH, UA: 6.5

## 2014-12-20 MED ORDER — ONDANSETRON HCL 4 MG PO TABS: 4.0000 mg | ORAL_TABLET | Freq: Three times a day (TID) | ORAL | Status: DC | PRN

## 2014-12-20 NOTE — Progress Notes (Signed)
Pre visit review using our clinic review tool, if applicable. No additional management support is needed unless otherwise documented below in the visit note. 

## 2014-12-20 NOTE — Progress Notes (Signed)
Subjective:    Patient ID: Sandy Hanna, female    DOB: Apr 03, 1966, 49 y.o.   MRN: [id number]  HPI  Here with c/o back and abd pain;  bilat flank back pain is dull, mild to mod gradually worsening x 3-4 wks without radation, nothing sems to make better or worse, afeb, and then onset 2 wks mid and upper abd pain worse with eating , dull moderate gradually worsening, assoc with lower appetite but no wt loss, mild nausea but no vomiting, but no fever or blood.  Taking PPI daily.  Has ongoing irreg BM's without signficant change mostly loose c/w her hx of IBS per pt.  levsin has helped on occasion.  Denies worsening depressive symptoms, suicidal ideation, or panic Past Medical History  Diagnosis Date  . Acute sinusitis, unspecified 12/30/2007  . ALLERGIC RHINITIS 12/15/2007  . ANEMIA-NOS 12/18/2010  . ANXIETY 12/15/2007  . BLEPHARITIS, RIGHT 04/03/2008  . CHEST PAIN 08/25/2010  . Migraines 12/23/2008  . DEPRESSION 12/15/2007  . FATIGUE 08/25/2010  . GERD 12/15/2007  . HYPERLIPIDEMIA 07/03/2010  . HYPERSOMNIA 12/18/2010  . HYPERTENSION 06/27/2007  . Irritable bowel syndrome 04/28/2009  . ISCHEMIC COLITIS 04/28/2009  . OTITIS MEDIA, BILATERAL 09/12/2008  . RECTAL BLEEDING 04/28/2009   Past Surgical History  Procedure Laterality Date  . Breast biopsy  2005    Negative    reports that she has been smoking Cigarettes.  She has been smoking about 0.30 packs per day. She has never used smokeless tobacco. She reports that she does not drink alcohol or use illicit drugs. family history includes Breast cancer in her mother; Diabetes in her mother and paternal grandmother; Hypertension in her mother; Ovarian cancer in an other family member. Allergies  Allergen Reactions  . Amoxicillin-Pot Clavulanate     REACTION: nausea  . Clarithromycin     REACTION: Nausea  . Doxycycline     REACTION: severe fatigue per pt   Current Outpatient Prescriptions on File Prior to Visit  Medication Sig Dispense Refill  .  Albuterol Sulfate (PROAIR RESPICLICK) 323 (90 BASE) MCG/ACT AEPB Inhale 2 puffs into the lungs every 4 (four) hours as needed. 1 each 0  . amLODipine (NORVASC) 5 MG tablet TAKE ONE TABLET BY MOUTH ONCE DAILY 90 tablet 3  . aspirin EC 81 MG tablet Take 1 tablet (81 mg total) by mouth daily. 90 tablet 11  . esomeprazole (NEXIUM) 40 MG capsule Take 1 capsule (40 mg total) by mouth daily. 90 capsule 2  . hyoscyamine (LEVSIN, ANASPAZ) 0.125 MG tablet Take 1 tablet (0.125 mg total) by mouth every 4 (four) hours as needed. 60 tablet 1  . lisinopril (PRINIVIL,ZESTRIL) 40 MG tablet Take 1 tablet (40 mg total) by mouth daily. 90 tablet 3  . methocarbamol (ROBAXIN) 500 MG tablet Take 1 tablet (500 mg total) by mouth every 6 (six) hours as needed for muscle spasms. 60 tablet 2   No current facility-administered medications on file prior to visit.   Review of Systems  Constitutional: Negative for unusual diaphoresis or other sweats  HENT: Negative for ringing in ear Eyes: Negative for double vision or worsening visual disturbance.  Respiratory: Negative for choking and stridor.   Gastrointestinal: Negative for vomiting or other signifcant bowel change Genitourinary: Negative for hematuria or decreased urine volume.  Musculoskeletal: Negative for other MSK pain or swelling Skin: Negative for color change and worsening wound.  Neurological: Negative for tremors and numbness other than noted  Psychiatric/Behavioral: Negative for decreased concentration  or agitation other than above       Objective:   Physical Exam BP 142/88 mmHg  Pulse 83  Temp(Src) 98.8 F (37.1 C) (Oral)  Ht 5\' 2"  (1.575 m)  Wt 167 lb 2 oz (75.807 kg)  BMI 30.56 kg/m2  SpO2 97% VS noted, non toxic Constitutional: Pt appears well-developed, well-nourished. Sandy Hanna HENT: Head: NCAT.  Right Ear: External ear normal.  Left Ear: External ear normal.  Eyes: . Pupils are equal, round, and reactive to light. Conjunctivae and EOM are  normal Neck: Normal range of motion. Neck supple.  Cardiovascular: Normal rate and regular rhythm.   Pulmonary/Chest: Effort normal and breath sounds without rales or wheezing.  Abd:  Soft, ND, + BS, with mild tender mid and epigastric abd, without guarding or rebound, no flank tender Neurological: Pt is alert. Not confused , motor grossly intact Skin: Skin is warm. No rash Psychiatric: Pt behavior is normal. No agitation. not depressed affect    Assessment & Plan:

## 2014-12-20 NOTE — Assessment & Plan Note (Addendum)
Udip neg, pain Located primarily mid abd with some lesser to epistaric/ruq, cant r/o pancreatitis or GB - declines ER eval tonight as our lab is closed, will place order for labs and u/s for asap, pt advised to ER for any worsening, to d/c simvastatin in case of pancreatitis, for zofran prn, cont IBS med prn, current afeb - hold antibx,  to f/u any worsening symptoms or concerns, pt to ER immediately for any fever, worsening pain , vomiting, blood, or other worsening

## 2014-12-20 NOTE — Patient Instructions (Signed)
Please take all new medication as prescribed - nausea medication  OK to stop the simvastatin  Please continue all other medications as before, and refills have been done if requested.  Please have the pharmacy call with any other refills you may need.  Please keep your appointments with your specialists as you may have planned  You will be contacted regarding the referral for: abdomen ultrasound  Please go to the LAB in the Basement (turn left off the elevator) for the tests to be done asap  You will be contacted by phone if any changes need to be made immediately.  Otherwise, you will receive a letter about your results with an explanation, but please check with MyChart first.  Please remember to sign up for MyChart if you have not done so, as this will be important to you in the future with finding out test results, communicating by private email, and scheduling acute appointments online when needed.

## 2014-12-21 NOTE — Assessment & Plan Note (Signed)
stable overall by history and exam, recent data reviewed with pt, and pt to continue medical treatment as before,  to f/u any worsening symptoms or concerns Lab Results  Component Value Date   WBC 9.3 07/29/2011   HGB 10.6* 07/29/2011   HCT 33.9* 07/29/2011   PLT 292.0 07/29/2011   GLUCOSE 92 07/29/2011   CHOL 154 07/29/2011   TRIG 61.0 07/29/2011   HDL 49.40 07/29/2011   LDLDIRECT 206.8 06/25/2010   LDLCALC 92 07/29/2011   ALT 15 07/29/2011   AST 17 07/29/2011   NA 137 07/29/2011   K 3.9 07/29/2011   CL 104 07/29/2011   CREATININE 0.7 07/29/2011   BUN 4* 07/29/2011   CO2 25 07/29/2011   TSH 1.29 07/29/2011

## 2014-12-21 NOTE — Assessment & Plan Note (Signed)
Mild elev today likely reactive, o/w stable overall by history and exam, recent data reviewed with pt, and pt to continue medical treatment as before,  to f/u any worsening symptoms or concerns BP Readings from Last 3 Encounters:  12/20/14 142/88  10/16/14 122/82  06/29/14 128/80

## 2014-12-23 ENCOUNTER — Other Ambulatory Visit (INDEPENDENT_AMBULATORY_CARE_PROVIDER_SITE_OTHER): Payer: BLUE CROSS/BLUE SHIELD

## 2014-12-23 DIAGNOSIS — R1010 Upper abdominal pain, unspecified: Secondary | ICD-10-CM

## 2014-12-23 LAB — HEPATIC FUNCTION PANEL
ALK PHOS: 113 U/L (ref 39–117)
ALT: 9 U/L (ref 0–35)
AST: 12 U/L (ref 0–37)
Albumin: 4.1 g/dL (ref 3.5–5.2)
Bilirubin, Direct: 0 mg/dL (ref 0.0–0.3)
TOTAL PROTEIN: 7.2 g/dL (ref 6.0–8.3)
Total Bilirubin: 0.2 mg/dL (ref 0.2–1.2)

## 2014-12-23 LAB — CBC WITH DIFFERENTIAL/PLATELET
Basophils Absolute: 0.1 10*3/uL (ref 0.0–0.1)
Basophils Relative: 1.1 % (ref 0.0–3.0)
EOS ABS: 0.1 10*3/uL (ref 0.0–0.7)
Eosinophils Relative: 1.3 % (ref 0.0–5.0)
HEMATOCRIT: 31 % — AB (ref 36.0–46.0)
HEMOGLOBIN: 9.9 g/dL — AB (ref 12.0–15.0)
Lymphocytes Relative: 51.1 % — ABNORMAL HIGH (ref 12.0–46.0)
Lymphs Abs: 3.9 10*3/uL (ref 0.7–4.0)
MCHC: 32.1 g/dL (ref 30.0–36.0)
MCV: 68.5 fl — ABNORMAL LOW (ref 78.0–100.0)
MONO ABS: 0.6 10*3/uL (ref 0.1–1.0)
Monocytes Relative: 7.6 % (ref 3.0–12.0)
NEUTROS PCT: 38.9 % — AB (ref 43.0–77.0)
Neutro Abs: 3 10*3/uL (ref 1.4–7.7)
Platelets: 269 10*3/uL (ref 150.0–400.0)
RBC: 4.52 Mil/uL (ref 3.87–5.11)
RDW: 18.7 % — ABNORMAL HIGH (ref 11.5–15.5)
WBC: 7.7 10*3/uL (ref 4.0–10.5)

## 2014-12-23 LAB — BASIC METABOLIC PANEL
BUN: 6 mg/dL (ref 6–23)
CALCIUM: 9.4 mg/dL (ref 8.4–10.5)
CO2: 21 mEq/L (ref 19–32)
Chloride: 109 mEq/L (ref 96–112)
Creatinine, Ser: 0.64 mg/dL (ref 0.40–1.20)
GFR: 126.68 mL/min (ref >60.00)
GLUCOSE: 97 mg/dL (ref 70–99)
Potassium: 3.9 mEq/L (ref 3.5–5.1)
Sodium: 141 mEq/L (ref 135–145)

## 2014-12-23 LAB — LIPASE: LIPASE: 14 U/L (ref 11.0–59.0)

## 2014-12-23 LAB — TSH: TSH: 1.49 u[IU]/mL (ref 0.35–4.50)

## 2014-12-24 ENCOUNTER — Encounter: Payer: Self-pay | Admitting: Internal Medicine

## 2014-12-24 ENCOUNTER — Ambulatory Visit
Admission: RE | Admit: 2014-12-24 | Discharge: 2014-12-24 | Disposition: A | Discharge status: Home / Self Care | Payer: BLUE CROSS/BLUE SHIELD | Source: Ambulatory Visit | Attending: Internal Medicine | Admitting: Internal Medicine

## 2014-12-24 ENCOUNTER — Other Ambulatory Visit: Payer: Self-pay | Admitting: Internal Medicine

## 2014-12-24 DIAGNOSIS — R1010 Upper abdominal pain, unspecified: Secondary | ICD-10-CM

## 2014-12-24 LAB — URINE CULTURE: Colony Count: 30000

## 2014-12-24 MED ORDER — NITROFURANTOIN MONOHYD MACRO 100 MG PO CAPS: 100.0000 mg | ORAL_CAPSULE | Freq: Two times a day (BID) | ORAL | Status: DC

## 2015-01-01 ENCOUNTER — Telehealth: Payer: Self-pay | Admitting: Internal Medicine

## 2015-01-01 NOTE — Telephone Encounter (Signed)
Lab and u/s were negative for acute issue  OK to continue nexium  I can refer to GI if ok with patient

## 2015-01-01 NOTE — Telephone Encounter (Signed)
Patient calling to report that she has not seen improvement even with prescription. Her stomach hurts when she eats with no change from the meds.

## 2015-01-02 NOTE — Telephone Encounter (Signed)
Pt aware, she declines the referral at this time.  Will call back if needed.

## 2015-01-02 NOTE — Telephone Encounter (Signed)
Left message for pt to call back  °

## 2015-01-08 ENCOUNTER — Telehealth: Payer: Self-pay | Admitting: Internal Medicine

## 2015-01-08 MED ORDER — FLUCONAZOLE 150 MG PO TABS
ORAL_TABLET | ORAL | Status: DC
Start: 2015-01-08 — End: 2015-08-21

## 2015-01-08 NOTE — Telephone Encounter (Signed)
[place] for the diflucan - done erx  I dont know about the statin, as we have not seen a cholesterol level since 2012

## 2015-01-08 NOTE — Telephone Encounter (Signed)
Pt called in said that she now has a yeast infection from antibiotic and would like meds called in.  Also she wants to know if she is suppose to be taking cholesterol meds or is she suppose be off of

## 2015-01-09 NOTE — Telephone Encounter (Signed)
Left message on machine for patient informing her of the Rx and statin medication.  Patient should call back for lab appointment or office visit.

## 2015-01-19 ENCOUNTER — Other Ambulatory Visit: Payer: Self-pay | Admitting: Internal Medicine

## 2015-03-06 ENCOUNTER — Other Ambulatory Visit: Payer: Self-pay

## 2015-03-06 DIAGNOSIS — Z1231 Encounter for screening mammogram for malignant neoplasm of breast: Secondary | ICD-10-CM

## 2015-03-20 ENCOUNTER — Other Ambulatory Visit: Payer: Self-pay | Admitting: Internal Medicine

## 2015-03-21 ENCOUNTER — Other Ambulatory Visit (INDEPENDENT_AMBULATORY_CARE_PROVIDER_SITE_OTHER): Payer: BLUE CROSS/BLUE SHIELD

## 2015-03-21 ENCOUNTER — Encounter: Payer: Self-pay | Admitting: Internal Medicine

## 2015-03-21 ENCOUNTER — Ambulatory Visit (INDEPENDENT_AMBULATORY_CARE_PROVIDER_SITE_OTHER): Payer: BLUE CROSS/BLUE SHIELD | Admitting: Internal Medicine

## 2015-03-21 ENCOUNTER — Ambulatory Visit
Admission: RE | Admit: 2015-03-21 | Discharge: 2015-03-21 | Disposition: A | Discharge status: Home / Self Care | Payer: BLUE CROSS/BLUE SHIELD | Source: Ambulatory Visit

## 2015-03-21 VITALS — BP 122/76 | HR 83 | Temp 98.4°F | Resp 18 | Ht 62.0 in | Wt 165.0 lb

## 2015-03-21 DIAGNOSIS — D649 Anemia, unspecified: Secondary | ICD-10-CM

## 2015-03-21 DIAGNOSIS — Z1231 Encounter for screening mammogram for malignant neoplasm of breast: Secondary | ICD-10-CM

## 2015-03-21 DIAGNOSIS — Z0000 Encounter for general adult medical examination without abnormal findings: Secondary | ICD-10-CM

## 2015-03-21 DIAGNOSIS — I10 Essential (primary) hypertension: Secondary | ICD-10-CM

## 2015-03-21 LAB — HEPATIC FUNCTION PANEL
ALT: 12 U/L (ref 0–35)
AST: 14 U/L (ref 0–37)
Albumin: 3.9 g/dL (ref 3.5–5.2)
Alkaline Phosphatase: 118 U/L — ABNORMAL HIGH (ref 39–117)
BILIRUBIN DIRECT: 0 mg/dL (ref 0.0–0.3)
BILIRUBIN TOTAL: 0.2 mg/dL (ref 0.2–1.2)
TOTAL PROTEIN: 7 g/dL (ref 6.0–8.3)

## 2015-03-21 LAB — BASIC METABOLIC PANEL
BUN: 5 mg/dL — ABNORMAL LOW (ref 6–23)
CALCIUM: 8.9 mg/dL (ref 8.4–10.5)
CO2: 26 meq/L (ref 19–32)
Chloride: 106 mEq/L (ref 96–112)
Creatinine, Ser: 0.67 mg/dL (ref 0.40–1.20)
GFR: 120.04 mL/min (ref >60.00)
Glucose, Bld: 97 mg/dL (ref 70–99)
POTASSIUM: 3.5 meq/L (ref 3.5–5.1)
SODIUM: 138 meq/L (ref 135–145)

## 2015-03-21 LAB — CBC WITH DIFFERENTIAL/PLATELET
Basophils Absolute: 0.3 10*3/uL — ABNORMAL HIGH (ref 0.0–0.1)
Basophils Relative: 3.2 % — ABNORMAL HIGH (ref 0.0–3.0)
Eosinophils Absolute: 0.2 10*3/uL (ref 0.0–0.7)
Eosinophils Relative: 1.7 % (ref 0.0–5.0)
HEMATOCRIT: 31 % — AB (ref 36.0–46.0)
Hemoglobin: 9.9 g/dL — ABNORMAL LOW (ref 12.0–15.0)
Lymphocytes Relative: 37.1 % (ref 12.0–46.0)
Lymphs Abs: 3.5 10*3/uL (ref 0.7–4.0)
MCHC: 31.8 g/dL (ref 30.0–36.0)
MCV: 68.3 fl — ABNORMAL LOW (ref 78.0–100.0)
MONO ABS: 0.7 10*3/uL (ref 0.1–1.0)
MONOS PCT: 7.4 % (ref 3.0–12.0)
Neutro Abs: 4.7 10*3/uL (ref 1.4–7.7)
Neutrophils Relative %: 50.6 % (ref 43.0–77.0)
Platelets: 304 10*3/uL (ref 150.0–400.0)
RBC: 4.54 Mil/uL (ref 3.87–5.11)
RDW: 17.8 % — AB (ref 11.5–15.5)
WBC: 9.3 10*3/uL (ref 4.0–10.5)

## 2015-03-21 LAB — LIPID PANEL
Cholesterol: 188 mg/dL (ref 0–200)
HDL: 45.2 mg/dL (ref >39.00)
LDL Cholesterol: 124 mg/dL — ABNORMAL HIGH (ref 0–99)
NonHDL: 142.8
TRIGLYCERIDES: 94 mg/dL (ref 0.0–149.0)
Total CHOL/HDL Ratio: 4
VLDL: 18.8 mg/dL (ref 0.0–40.0)

## 2015-03-21 LAB — URINALYSIS, ROUTINE W REFLEX MICROSCOPIC
Bilirubin Urine: NEGATIVE
Hgb urine dipstick: NEGATIVE
Ketones, ur: NEGATIVE
LEUKOCYTES UA: NEGATIVE
Nitrite: NEGATIVE
PH: 7 (ref 5.0–8.0)
SPECIFIC GRAVITY, URINE: 1.015 (ref 1.000–1.030)
Total Protein, Urine: NEGATIVE
Urine Glucose: NEGATIVE
Urobilinogen, UA: 1 (ref 0.0–1.0)

## 2015-03-21 LAB — IBC PANEL
Iron: 24 ug/dL — ABNORMAL LOW (ref 42–145)
SATURATION RATIOS: 5 % — AB (ref 20.0–50.0)
Transferrin: 340 mg/dL (ref 212.0–360.0)

## 2015-03-21 LAB — TSH: TSH: 1.54 u[IU]/mL (ref 0.35–4.50)

## 2015-03-21 NOTE — Progress Notes (Signed)
Pre visit review using our clinic review tool, if applicable. No additional management support is needed unless otherwise documented below in the visit note. 

## 2015-03-21 NOTE — Assessment & Plan Note (Signed)

## 2015-03-21 NOTE — Patient Instructions (Signed)
Please continue all other medications as before, and refills have been done if requested.  Please have the pharmacy call with any other refills you may need.  Please continue your efforts at being more active, low cholesterol diet, and weight control.  You are otherwise up to date with prevention measures today.  Please keep your appointments with your specialists as you may have planned- GYN next month  Please go to the LAB in the Basement (turn left off the elevator) for the tests to be done today  You will be contacted by phone if any changes need to be made immediately.  Otherwise, you will receive a letter about your results with an explanation, but please check with MyChart first.  Please remember to sign up for MyChart if you have not done so, as this will be important to you in the future with finding out test results, communicating by private email, and scheduling acute appointments online when needed.  Please return in 1 year for your yearly visit, or sooner if needed, with Lab testing done 3-5 days before

## 2015-03-21 NOTE — Assessment & Plan Note (Signed)
For f/u cbc, iron panel, consider GYN f/u

## 2015-03-21 NOTE — Progress Notes (Signed)
Subjective:    Patient ID: Sandy Hanna, female    DOB: 1966-09-18, 49 y.o.   MRN: [id number]  HPI  Here for wellness and f/u;  Overall doing ok;  Pt denies Chest pain, worsening SOB, DOE, wheezing, orthopnea, PND, worsening LE edema, palpitations, dizziness or syncope.  Pt denies neurological change such as new headache, facial or extremity weakness.  Pt denies polydipsia, polyuria, or low sugar symptoms. Pt states overall good compliance with treatment and medications, good tolerability, and has been trying to follow appropriate diet.  Pt denies worsening depressive symptoms, suicidal ideation or panic. No fever, night sweats, wt loss, loss of appetite, or other constitutional symptoms.  Pt states good ability with ADL's, has low fall risk, home safety reviewed and adequate, no other significant changes in hearing or vision, and only occasionally active with exercise.  Has recurring dysmeorrhea and fatigue, still has monthly menses, no other overt blood loss.  Last colonoscopy 2010 - neg for acute. For f/u at 49 yo. Past Medical History  Diagnosis Date  . Acute sinusitis, unspecified 12/30/2007  . ALLERGIC RHINITIS 12/15/2007  . ANEMIA-NOS 12/18/2010  . ANXIETY 12/15/2007  . BLEPHARITIS, RIGHT 04/03/2008  . CHEST PAIN 08/25/2010  . Migraines 12/23/2008  . DEPRESSION 12/15/2007  . FATIGUE 08/25/2010  . GERD 12/15/2007  . HYPERLIPIDEMIA 07/03/2010  . HYPERSOMNIA 12/18/2010  . HYPERTENSION 06/27/2007  . Irritable bowel syndrome 04/28/2009  . ISCHEMIC COLITIS 04/28/2009  . OTITIS MEDIA, BILATERAL 09/12/2008  . RECTAL BLEEDING 04/28/2009   Past Surgical History  Procedure Laterality Date  . Breast biopsy  2005    Negative    reports that she has been smoking Cigarettes.  She has been smoking about 0.30 packs per day. She has never used smokeless tobacco. She reports that she does not drink alcohol or use illicit drugs. family history includes Breast cancer in her mother; Diabetes in her mother and  paternal grandmother; Hypertension in her mother; Ovarian cancer in an other family member. Allergies  Allergen Reactions  . Amoxicillin-Pot Clavulanate     REACTION: nausea  . Clarithromycin     REACTION: Nausea  . Doxycycline     REACTION: severe fatigue per pt   Current Outpatient Prescriptions on File Prior to Visit  Medication Sig Dispense Refill  . Albuterol Sulfate (PROAIR RESPICLICK) 517 (90 BASE) MCG/ACT AEPB Inhale 2 puffs into the lungs every 4 (four) hours as needed. 1 each 0  . amLODipine (NORVASC) 5 MG tablet TAKE ONE TABLET BY MOUTH ONCE DAILY 90 tablet 3  . [facility] ASPIRIN LOW DOSE 81 MG EC tablet TAKE 1 TABLET (81 MG TOTAL) BY MOUTH DAILY. 90 tablet 3  . fluconazole (DIFLUCAN) 150 MG tablet 1 tab by mouth every 3 days as needed 2 tablet 1  . hyoscyamine (LEVSIN, ANASPAZ) 0.125 MG tablet Take 1 tablet (0.125 mg total) by mouth every 4 (four) hours as needed. 60 tablet 1  . lisinopril (PRINIVIL,ZESTRIL) 40 MG tablet TAKE 1 TABLET (40 MG TOTAL) BY MOUTH DAILY. 90 tablet 3  . methocarbamol (ROBAXIN) 500 MG tablet Take 1 tablet (500 mg total) by mouth every 6 (six) hours as needed for muscle spasms. 60 tablet 2  . NEXIUM 40 MG capsule TAKE ONE CAPSULE BY MOUTH EVERY DAY 90 capsule 0  . nitrofurantoin, macrocrystal-monohydrate, (MACROBID) 100 MG capsule Take 1 capsule (100 mg total) by mouth 2 (two) times daily. 20 capsule 0  . ondansetron (ZOFRAN) 4 MG tablet Take 1 tablet (4 mg total)  by mouth every 8 (eight) hours as needed for nausea or vomiting. 40 tablet 0   No current facility-administered medications on file prior to visit.   Review of Systems Constitutional: Negative for increased diaphoresis, other activity, appetite or siginficant weight change other than noted HENT: Negative for worsening hearing loss, ear pain, facial swelling, mouth sores and neck stiffness.   Eyes: Negative for other worsening pain, redness or visual disturbance.  Respiratory: Negative for shortness  of breath and wheezing  Cardiovascular: Negative for chest pain and palpitations.  Gastrointestinal: Negative for diarrhea, blood in stool, abdominal distention or other pain Genitourinary: Negative for hematuria, flank pain or change in urine volume.  Musculoskeletal: Negative for myalgias or other joint complaints.  Skin: Negative for color change and wound or drainage.  Neurological: Negative for syncope and numbness. other than noted Hematological: Negative for adenopathy. or other swelling Psychiatric/Behavioral: Negative for hallucinations, SI, self-injury, decreased concentration or other worsening agitation.      Objective:   Physical Exam BP 122/76 mmHg  Pulse 83  Temp(Src) 98.4 F (36.9 C) (Oral)  Resp 18  Ht 5\' 2"  (1.575 m)  Wt 165 lb (74.844 kg)  BMI 30.17 kg/m2  SpO2 99% VS noted,  Constitutional: Pt is oriented to person, place, and time. Appears well-developed and well-nourished, in no significant distress Head: Normocephalic and atraumatic.  Right Ear: External ear normal.  Left Ear: External ear normal.  Nose: Nose normal.  Mouth/Throat: Oropharynx is clear and moist.  Eyes: Conjunctivae and EOM are normal. Pupils are equal, round, and reactive to light.  Neck: Normal range of motion. Neck supple. No JVD present. No tracheal deviation present or significant neck LA or mass Cardiovascular: Normal rate, regular rhythm, normal heart sounds and intact distal pulses.   Pulmonary/Chest: Effort normal and breath sounds without rales or wheezing  Abdominal: Soft. Bowel sounds are normal. NT. No HSM  Musculoskeletal: Normal range of motion. Exhibits no edema.  Lymphadenopathy:  Has no cervical adenopathy.  Neurological: Pt is alert and oriented to person, place, and time. Pt has normal reflexes. No cranial nerve deficit. Motor grossly intact Skin: Skin is warm and dry. No rash noted.  Psychiatric:  Has normal mood and affect. Behavior is normal.     Assessment & Plan:

## 2015-03-21 NOTE — Assessment & Plan Note (Signed)
stable overall by history and exam, recent data reviewed with pt, and pt to continue medical treatment as before,  to f/u any worsening symptoms or concerns BP Readings from Last 3 Encounters:  03/21/15 122/76  12/20/14 142/88  10/16/14 122/82

## 2015-06-22 ENCOUNTER — Other Ambulatory Visit: Payer: Self-pay | Admitting: Internal Medicine

## 2015-08-08 ENCOUNTER — Telehealth: Payer: Self-pay | Admitting: *Deleted

## 2015-08-08 MED ORDER — PANTOPRAZOLE SODIUM 40 MG PO TBEC: 40.0000 mg | DELAYED_RELEASE_TABLET | Freq: Every day | ORAL | Status: DC

## 2015-08-08 NOTE — Telephone Encounter (Signed)
Ok change to protonix

## 2015-08-08 NOTE — Telephone Encounter (Signed)
Receive call pt states her insurance will not cover nexium anymore. Her pharmacist gave her a generic form, but med is not helping at all. Pt is requesting alternative for nexium...Johny Chess

## 2015-08-08 NOTE — Telephone Encounter (Signed)
Called pt no answer LMOM md change to Protonix. [facility].../lmb

## 2015-08-21 ENCOUNTER — Encounter: Payer: Self-pay | Admitting: Internal Medicine

## 2015-08-21 ENCOUNTER — Telehealth: Payer: Self-pay | Admitting: Internal Medicine

## 2015-08-21 ENCOUNTER — Ambulatory Visit (INDEPENDENT_AMBULATORY_CARE_PROVIDER_SITE_OTHER): Payer: BLUE CROSS/BLUE SHIELD | Admitting: Internal Medicine

## 2015-08-21 ENCOUNTER — Ambulatory Visit: Payer: BLUE CROSS/BLUE SHIELD | Admitting: Internal Medicine

## 2015-08-21 VITALS — BP 120/80 | HR 79 | Temp 98.4°F | Ht 62.0 in | Wt 163.0 lb

## 2015-08-21 DIAGNOSIS — J019 Acute sinusitis, unspecified: Secondary | ICD-10-CM | POA: Diagnosis not present

## 2015-08-21 DIAGNOSIS — H109 Unspecified conjunctivitis: Secondary | ICD-10-CM

## 2015-08-21 DIAGNOSIS — I10 Essential (primary) hypertension: Secondary | ICD-10-CM

## 2015-08-21 MED ORDER — LEVOFLOXACIN 500 MG PO TABS: 500.0000 mg | ORAL_TABLET | Freq: Every day | ORAL | Status: DC

## 2015-08-21 MED ORDER — FLUCONAZOLE 150 MG PO TABS: ORAL_TABLET | ORAL | Status: DC

## 2015-08-21 MED ORDER — ERYTHROMYCIN 5 MG/GM OP OINT: 1.0000 "application " | TOPICAL_OINTMENT | Freq: Four times a day (QID) | OPHTHALMIC | Status: DC

## 2015-08-21 NOTE — Progress Notes (Signed)
Pre visit review using our clinic review tool, if applicable. No additional management support is needed unless otherwise documented below in the visit note. 

## 2015-08-21 NOTE — Telephone Encounter (Signed)
Pt called regarding a prescription for an eye cream. I don't see an eye cream on her med list. She states the pharmacy has requested it and it needs a PA Please advise patient

## 2015-08-21 NOTE — Assessment & Plan Note (Signed)
Mild to mod, for antibx course,  to f/u any worsening symptoms or concerns 

## 2015-08-21 NOTE — Patient Instructions (Signed)
Please take all new medication as prescribed - the eye ointment and pill antibiotics  Please continue all other medications as before, and refills have been done if requested.  Please have the pharmacy call with any other refills you may need.  Please keep your appointments with your specialists as you may have planned

## 2015-08-21 NOTE — Telephone Encounter (Signed)
Pt Rx antibiotic cream by NP at [facility] in 01/2014. Pt scheduled for appt with [facility] today

## 2015-08-21 NOTE — Assessment & Plan Note (Signed)
stable overall by history and exam, recent data reviewed with pt, and pt to continue medical treatment as before,  to f/u any worsening symptoms or concerns BP Readings from Last 3 Encounters:  08/21/15 120/80  03/21/15 122/76  12/20/14 142/88

## 2015-08-21 NOTE — Progress Notes (Signed)
Subjective:    Patient ID: Sandy Hanna, female    DOB: 04/23/1966, 49 y.o.   MRN: [id number]  HPI   Here with 2-3 days acute onset fever, facial pain, pressure, bilat eye erythema and weepiness, headache, general weakness and malaise, and greenish d/c, with mild ST and cough, but pt denies chest pain, wheezing, increased sob or doe, orthopnea, PND, increased LE swelling, palpitations, dizziness or syncope.  Pt denies new neurological symptoms such as new headache, or facial or extremity weakness or numbness   Pt denies polydipsia, polyuria, or low sugar symptoms such as weakness or confusion improved with po intake.  Pt states overall good compliance with meds, trying to follow lower cholesterol, diabetic diet, wt overall stable but little exercise however.    Past Medical History  Diagnosis Date  . Acute sinusitis, unspecified 12/30/2007  . ALLERGIC RHINITIS 12/15/2007  . ANEMIA-NOS 12/18/2010  . ANXIETY 12/15/2007  . BLEPHARITIS, RIGHT 04/03/2008  . CHEST PAIN 08/25/2010  . Migraines 12/23/2008  . DEPRESSION 12/15/2007  . FATIGUE 08/25/2010  . GERD 12/15/2007  . HYPERLIPIDEMIA 07/03/2010  . HYPERSOMNIA 12/18/2010  . HYPERTENSION 06/27/2007  . Irritable bowel syndrome 04/28/2009  . ISCHEMIC COLITIS 04/28/2009  . OTITIS MEDIA, BILATERAL 09/12/2008  . RECTAL BLEEDING 04/28/2009   Past Surgical History  Procedure Laterality Date  . Breast biopsy  2005    Negative    reports that she has been smoking Cigarettes.  She has been smoking about 0.30 packs per day. She has never used smokeless tobacco. She reports that she does not drink alcohol or use illicit drugs. family history includes Breast cancer in her mother; Diabetes in her mother and paternal grandmother; Hypertension in her mother; Ovarian cancer in an other family member. Allergies  Allergen Reactions  . Amoxicillin-Pot Clavulanate     REACTION: nausea  . Clarithromycin     REACTION: Nausea  . Doxycycline     REACTION: severe fatigue per  pt   Current Outpatient Prescriptions on File Prior to Visit  Medication Sig Dispense Refill  . Albuterol Sulfate (PROAIR RESPICLICK) 259 (90 BASE) MCG/ACT AEPB Inhale 2 puffs into the lungs every 4 (four) hours as needed. 1 each 0  . amLODipine (NORVASC) 5 MG tablet TAKE ONE TABLET BY MOUTH ONCE DAILY 90 tablet 3  . [facility] ASPIRIN LOW DOSE 81 MG EC tablet TAKE 1 TABLET (81 MG TOTAL) BY MOUTH DAILY. 90 tablet 3  . hyoscyamine (LEVSIN, ANASPAZ) 0.125 MG tablet Take 1 tablet (0.125 mg total) by mouth every 4 (four) hours as needed. 60 tablet 1  . lisinopril (PRINIVIL,ZESTRIL) 40 MG tablet TAKE 1 TABLET (40 MG TOTAL) BY MOUTH DAILY. 90 tablet 3  . methocarbamol (ROBAXIN) 500 MG tablet Take 1 tablet (500 mg total) by mouth every 6 (six) hours as needed for muscle spasms. 60 tablet 2  . ondansetron (ZOFRAN) 4 MG tablet Take 1 tablet (4 mg total) by mouth every 8 (eight) hours as needed for nausea or vomiting. 40 tablet 0  . pantoprazole (PROTONIX) 40 MG tablet Take 1 tablet (40 mg total) by mouth daily. 90 tablet 3   No current facility-administered medications on file prior to visit.   Review of Systems  Constitutional: Negative for unusual diaphoresis or night sweats HENT: Negative for ringing in ear or discharge Eyes: Negative for double vision or worsening visual disturbance.  Respiratory: Negative for choking and stridor.   Gastrointestinal: Negative for vomiting or other signifcant bowel change Genitourinary: Negative for hematuria  or change in urine volume.  Musculoskeletal: Negative for other MSK pain or swelling Skin: Negative for color change and worsening wound.  Neurological: Negative for tremors and numbness other than noted  Psychiatric/Behavioral: Negative for decreased concentration or agitation other than above       Objective:   Physical Exam BP 120/80 mmHg  Pulse 79  Temp(Src) 98.4 F (36.9 C) (Oral)  Ht 5\' 2"  (1.575 m)  Wt 163 lb (73.936 kg)  BMI 29.81 kg/m2  SpO2 99%   LMP 07/23/2015 VS noted, mild ill Constitutional: Pt appears in no significant distress HENT: Head: NCAT.  Right Ear: External ear normal.  Left Ear: External ear normal.  Eyes: . Pupils are equal, round, and reactive to light. Conjunctivae with bilat erythema and weepiness, Left upper and lower eyelids mild swelling, and EOM are normal Neck: Normal range of motion. Neck supple.  Cardiovascular: Normal rate and regular rhythm.   Pulmonary/Chest: Effort normal and breath sounds without rales or wheezing.  Neurological: Pt is alert. Not confused , motor grossly intact Skin: Skin is warm. No rash, no LE edema Psychiatric: Pt behavior is normal. No agitation.     Assessment & Plan:

## 2015-08-25 ENCOUNTER — Telehealth: Payer: Self-pay | Admitting: Internal Medicine

## 2015-08-25 NOTE — Telephone Encounter (Signed)
Pt called in said that her bp meds is making her dizzy, what should she do.  It is not going away  Best number [phone]

## 2015-08-25 NOTE — Telephone Encounter (Signed)
MD is out of the office on vacation. He will not return back in the office until 09/03/15. Pt will need to make an appt to discuss sxs with another provider.Pls call and schedule appt with first available md.../lmb

## 2015-11-05 ENCOUNTER — Ambulatory Visit: Payer: BLUE CROSS/BLUE SHIELD | Admitting: Internal Medicine

## 2015-12-23 ENCOUNTER — Telehealth: Payer: Self-pay | Admitting: Internal Medicine

## 2015-12-23 DIAGNOSIS — Z0000 Encounter for general adult medical examination without abnormal findings: Secondary | ICD-10-CM

## 2015-12-23 NOTE — Telephone Encounter (Signed)
Patient states she has just turned 65 and would like to be referred for colonoscopy.

## 2015-12-24 NOTE — Telephone Encounter (Signed)
Left vm for patient

## 2015-12-24 NOTE — Telephone Encounter (Signed)
colonoscopy referral done

## 2015-12-29 ENCOUNTER — Ambulatory Visit (INDEPENDENT_AMBULATORY_CARE_PROVIDER_SITE_OTHER): Payer: 59 | Admitting: Internal Medicine

## 2015-12-29 ENCOUNTER — Encounter: Payer: Self-pay | Admitting: Internal Medicine

## 2015-12-29 VITALS — BP 134/80 | HR 88 | Temp 98.3°F | Resp 16 | Wt 164.0 lb

## 2015-12-29 DIAGNOSIS — J0100 Acute maxillary sinusitis, unspecified: Secondary | ICD-10-CM | POA: Diagnosis not present

## 2015-12-29 MED ORDER — AZITHROMYCIN 250 MG PO TABS: ORAL_TABLET | ORAL | Status: DC

## 2015-12-29 NOTE — Progress Notes (Signed)
Pre visit review using our clinic review tool, if applicable. No additional management support is needed unless otherwise documented below in the visit note. 

## 2015-12-29 NOTE — Patient Instructions (Signed)

## 2015-12-29 NOTE — Progress Notes (Signed)
Subjective:    Patient ID: Sandy Hanna, female    DOB: Dec 20, 1965, 50 y.o.   MRN: [id number]  HPI  She is here for an acute visit for cold symptoms.     Her symptoms started one week ago.  She states sore throat, ear pain, sinus pressure, headaches, cough, sneezing, pnd, nasal congestion, chills and dizziness.  She has taken aleve, allegra, halls cough drops, coricidan cold and has rested.  Her symptoms continue to worsen.   Medications and allergies reviewed with patient and updated if appropriate.  Patient Active Problem List   Diagnosis Date Noted  . Acute sinus infection 08/21/2015  . Conjunctivitis 08/21/2015  . Abdominal pain 12/20/2014  . Paresthesia 05/11/2012  . Rash 01/23/2012  . Constipation 08/10/2011  . Preventative health care 05/05/2011  . Anemia 12/18/2010  . HYPERSOMNIA 12/18/2010  . FATIGUE 08/25/2010  . HYPERLIPIDEMIA 07/03/2010  . SHOULDER PAIN, RIGHT 05/28/2009  . ISCHEMIC COLITIS 04/28/2009  . Irritable bowel syndrome 04/28/2009  . RECTAL BLEEDING 04/28/2009  . COMMON MIGRAINE 12/23/2008  . ANXIETY 12/15/2007  . Depression 12/15/2007  . ALLERGIC RHINITIS 12/15/2007  . GERD 12/15/2007  . Essential hypertension 06/27/2007    Current Outpatient Prescriptions on File Prior to Visit  Medication Sig Dispense Refill  . Albuterol Sulfate (PROAIR RESPICLICK) 123XX123 (90 BASE) MCG/ACT AEPB Inhale 2 puffs into the lungs every 4 (four) hours as needed. 1 each 0  . amLODipine (NORVASC) 5 MG tablet TAKE ONE TABLET BY MOUTH ONCE DAILY 90 tablet 3  . [facility] ASPIRIN LOW DOSE 81 MG EC tablet TAKE 1 TABLET (81 MG TOTAL) BY MOUTH DAILY. 90 tablet 3  . erythromycin ophthalmic ointment Place 1 application into both eyes 4 (four) times daily. 3.5 g 1  . fluconazole (DIFLUCAN) 150 MG tablet 1 tab by mouth every 3 days as needed 2 tablet 1  . hyoscyamine (LEVSIN, ANASPAZ) 0.125 MG tablet Take 1 tablet (0.125 mg total) by mouth every 4 (four) hours as needed. 60 tablet 1  .  lisinopril (PRINIVIL,ZESTRIL) 40 MG tablet TAKE 1 TABLET (40 MG TOTAL) BY MOUTH DAILY. 90 tablet 3  . methocarbamol (ROBAXIN) 500 MG tablet Take 1 tablet (500 mg total) by mouth every 6 (six) hours as needed for muscle spasms. 60 tablet 2  . ondansetron (ZOFRAN) 4 MG tablet Take 1 tablet (4 mg total) by mouth every 8 (eight) hours as needed for nausea or vomiting. 40 tablet 0  . pantoprazole (PROTONIX) 40 MG tablet Take 1 tablet (40 mg total) by mouth daily. 90 tablet 3   No current facility-administered medications on file prior to visit.    Past Medical History  Diagnosis Date  . Acute sinusitis, unspecified 12/30/2007  . ALLERGIC RHINITIS 12/15/2007  . ANEMIA-NOS 12/18/2010  . ANXIETY 12/15/2007  . BLEPHARITIS, RIGHT 04/03/2008  . CHEST PAIN 08/25/2010  . Migraines 12/23/2008  . DEPRESSION 12/15/2007  . FATIGUE 08/25/2010  . GERD 12/15/2007  . HYPERLIPIDEMIA 07/03/2010  . HYPERSOMNIA 12/18/2010  . HYPERTENSION 06/27/2007  . Irritable bowel syndrome 04/28/2009  . ISCHEMIC COLITIS 04/28/2009  . OTITIS MEDIA, BILATERAL 09/12/2008  . RECTAL BLEEDING 04/28/2009    Past Surgical History  Procedure Laterality Date  . Breast biopsy  2005    Negative    Social History   Social History  . Marital Status: Single    Spouse Name: N/A  . Number of Children: 1  . Years of Education: N/A   Occupational History  . [employer]   Social History Main Topics  . Smoking status: Current Every Day Smoker -- 0.30 packs/day    Types: Cigarettes  . Smokeless tobacco: Never Used     Comment: 6 cigarettes a day  . Alcohol Use: No  . Drug Use: No  . Sexual Activity: Not on file   Other Topics Concern  . Not on file   Social History Narrative   Patient does not get regular exercise   Daily Caffeine- use 2 cups daily    Family History  Problem Relation Age of Onset  . Breast cancer Mother   . Hypertension Mother   . Diabetes Mother   . Diabetes Paternal Grandmother   .  Ovarian cancer      Aunt    Review of Systems  Constitutional: Positive for chills. Negative for fever.  HENT: Positive for congestion, ear pain (bilateral), postnasal drip, sinus pressure, sneezing and sore throat.   Respiratory: Positive for cough (dry). Negative for shortness of breath and wheezing.   Cardiovascular: Negative for chest pain.  Gastrointestinal: Negative for nausea.  Neurological: Positive for dizziness and headaches. Negative for light-headedness.       Objective:   Filed Vitals:   12/29/15 1612  BP: 134/80  Pulse: 88  Temp: 98.3 F (36.8 C)  Resp: 16   Filed Weights   12/29/15 1612  Weight: 164 lb (74.39 kg)   Body mass index is 29.99 kg/(m^2).   Physical Exam GENERAL APPEARANCE: Appears stated age, mildly ill appearing, NAD EYES: conjunctiva clear, no icterus HEENT: bilateral tympanic membranes and ear canals normal, oropharynx with mild erythema, no thyromegaly, trachea midline, no cervical or supraclavicular lymphadenopathy LUNGS: Clear to auscultation without wheeze or crackles, unlabored breathing, good air entry bilaterally HEART: Normal S1,S2 without murmurs EXTREMITIES: Without clubbing, cyanosis, or edema        Assessment & Plan:   Sinus infection She has a history of sinus infection, 1-2 a year - typically requiring an antibiotic Discuss otc meds to use for prevention and once symptoms start Will start an antibiotic Continue otc medicaions for symptoms relief Rest, fluids Stressed smoking cessation Call if no improvement

## 2016-02-12 ENCOUNTER — Other Ambulatory Visit: Payer: Self-pay | Admitting: Internal Medicine

## 2016-02-16 ENCOUNTER — Other Ambulatory Visit: Payer: Self-pay

## 2016-02-16 MED ORDER — AMLODIPINE BESYLATE 5 MG PO TABS: 5.0000 mg | ORAL_TABLET | Freq: Every day | ORAL | Status: DC

## 2016-03-23 ENCOUNTER — Ambulatory Visit (INDEPENDENT_AMBULATORY_CARE_PROVIDER_SITE_OTHER): Payer: 59 | Admitting: Internal Medicine

## 2016-03-23 ENCOUNTER — Encounter: Payer: Self-pay | Admitting: Internal Medicine

## 2016-03-23 ENCOUNTER — Other Ambulatory Visit (INDEPENDENT_AMBULATORY_CARE_PROVIDER_SITE_OTHER): Payer: 59

## 2016-03-23 VITALS — BP 136/82 | HR 81 | Temp 98.6°F | Resp 20 | Wt 164.0 lb

## 2016-03-23 DIAGNOSIS — D509 Iron deficiency anemia, unspecified: Secondary | ICD-10-CM | POA: Insufficient documentation

## 2016-03-23 DIAGNOSIS — E785 Hyperlipidemia, unspecified: Secondary | ICD-10-CM

## 2016-03-23 DIAGNOSIS — Z0001 Encounter for general adult medical examination with abnormal findings: Secondary | ICD-10-CM

## 2016-03-23 DIAGNOSIS — E559 Vitamin D deficiency, unspecified: Secondary | ICD-10-CM | POA: Insufficient documentation

## 2016-03-23 DIAGNOSIS — R6889 Other general symptoms and signs: Secondary | ICD-10-CM

## 2016-03-23 DIAGNOSIS — Z23 Encounter for immunization: Secondary | ICD-10-CM

## 2016-03-23 DIAGNOSIS — I10 Essential (primary) hypertension: Secondary | ICD-10-CM

## 2016-03-23 HISTORY — DX: Vitamin D deficiency, unspecified: E55.9

## 2016-03-23 LAB — URINALYSIS, ROUTINE W REFLEX MICROSCOPIC
BILIRUBIN URINE: NEGATIVE
Ketones, ur: NEGATIVE
Leukocytes, UA: NEGATIVE
NITRITE: NEGATIVE
PH: 6.5 (ref 5.0–8.0)
Specific Gravity, Urine: 1.01 (ref 1.000–1.030)
Total Protein, Urine: NEGATIVE
Urine Glucose: NEGATIVE
Urobilinogen, UA: 0.2 (ref 0.0–1.0)

## 2016-03-23 LAB — CBC WITH DIFFERENTIAL/PLATELET
BASOS PCT: 0.9 % (ref 0.0–3.0)
Basophils Absolute: 0.1 10*3/uL (ref 0.0–0.1)
EOS PCT: 1.1 % (ref 0.0–5.0)
Eosinophils Absolute: 0.1 10*3/uL (ref 0.0–0.7)
HCT: 37 % (ref 36.0–46.0)
HEMOGLOBIN: 11.9 g/dL — AB (ref 12.0–15.0)
LYMPHS ABS: 4.4 10*3/uL — AB (ref 0.7–4.0)
Lymphocytes Relative: 45.2 % (ref 12.0–46.0)
MCHC: 32.1 g/dL (ref 30.0–36.0)
MCV: 76 fl — ABNORMAL LOW (ref 78.0–100.0)
MONO ABS: 0.7 10*3/uL (ref 0.1–1.0)
Monocytes Relative: 7 % (ref 3.0–12.0)
NEUTROS ABS: 4.4 10*3/uL (ref 1.4–7.7)
Neutrophils Relative %: 45.8 % (ref 43.0–77.0)
Platelets: 290 10*3/uL (ref 150.0–400.0)
RBC: 4.86 Mil/uL (ref 3.87–5.11)
RDW: 17.3 % — AB (ref 11.5–15.5)
WBC: 9.7 10*3/uL (ref 4.0–10.5)

## 2016-03-23 LAB — LIPID PANEL
CHOL/HDL RATIO: 5
Cholesterol: 234 mg/dL — ABNORMAL HIGH (ref 0–200)
HDL: 49.9 mg/dL (ref >39.00)
LDL CALC: 168 mg/dL — AB (ref 0–99)
NONHDL: 183.86
Triglycerides: 78 mg/dL (ref 0.0–149.0)
VLDL: 15.6 mg/dL (ref 0.0–40.0)

## 2016-03-23 LAB — BASIC METABOLIC PANEL
BUN: 6 mg/dL (ref 6–23)
CO2: 26 mEq/L (ref 19–32)
Calcium: 9.6 mg/dL (ref 8.4–10.5)
Chloride: 104 mEq/L (ref 96–112)
Creatinine, Ser: 0.72 mg/dL (ref 0.40–1.20)
GFR: 110.03 mL/min (ref >60.00)
Glucose, Bld: 91 mg/dL (ref 70–99)
POTASSIUM: 4.1 meq/L (ref 3.5–5.1)
SODIUM: 138 meq/L (ref 135–145)

## 2016-03-23 LAB — IBC PANEL
Iron: 31 ug/dL — ABNORMAL LOW (ref 42–145)
SATURATION RATIOS: 6.3 % — AB (ref 20.0–50.0)
TRANSFERRIN: 354 mg/dL (ref 212.0–360.0)

## 2016-03-23 LAB — HEPATIC FUNCTION PANEL
ALK PHOS: 106 U/L (ref 39–117)
ALT: 11 U/L (ref 0–35)
AST: 12 U/L (ref 0–37)
Albumin: 4.2 g/dL (ref 3.5–5.2)
BILIRUBIN DIRECT: 0.1 mg/dL (ref 0.0–0.3)
BILIRUBIN TOTAL: 0.3 mg/dL (ref 0.2–1.2)
Total Protein: 7.7 g/dL (ref 6.0–8.3)

## 2016-03-23 LAB — TSH: TSH: 1.99 u[IU]/mL (ref 0.35–4.50)

## 2016-03-23 LAB — VITAMIN D 25 HYDROXY (VIT D DEFICIENCY, FRACTURES): VITD: 17.21 ng/mL — ABNORMAL LOW (ref 30.00–100.00)

## 2016-03-23 NOTE — Progress Notes (Signed)
Subjective:    Patient ID: Sandy Hanna, female    DOB: [birth year], 50 y.o.   MRN: [id number]  HPI  Here for wellness and f/u;  Overall doing ok;  Pt denies Chest pain, worsening SOB, DOE, wheezing, orthopnea, PND, worsening LE edema, palpitations, dizziness or syncope.  Pt denies neurological change such as new headache, facial or extremity weakness.  Pt denies polydipsia, polyuria, or low sugar symptoms. Pt states overall good compliance with treatment and medications, good tolerability, and has been trying to follow appropriate diet.  Pt denies worsening depressive symptoms, suicidal ideation or panic. No fever, night sweats, wt loss, loss of appetite, or other constitutional symptoms.  Pt states good ability with ADL's, has low fall risk, home safety reviewed and adequate, no other significant changes in hearing or vision, and only occasionally active with exercise. Wt Readings from Last 3 Encounters:  03/23/16 164 lb (74.39 kg)  12/29/15 164 lb (74.39 kg)  08/21/15 163 lb (73.936 kg)  Does not have overt bleeding, and only short menses episodes every 2 mo.  Has been Vit D def per GYN but tyring to work it into her diet to avoid Vit D pills, also not taking any iron supplement as well. Does c/o ongoing fatigue, but denies signficant daytime hypersomnolence.  Denies urinary symptoms such as dysuria, frequency, urgency, flank pain, hematuria or n/v, fever, chills.  Denies worsening reflux, abd pain, dysphagia, n/v, bowel change or blood. Past Medical History  Diagnosis Date  . Acute sinusitis, unspecified 12/30/2007  . ALLERGIC RHINITIS 12/15/2007  . ANEMIA-NOS 12/18/2010  . ANXIETY 12/15/2007  . BLEPHARITIS, RIGHT 04/03/2008  . CHEST PAIN 08/25/2010  . Migraines 12/23/2008  . DEPRESSION 12/15/2007  . FATIGUE 08/25/2010  . GERD 12/15/2007  . HYPERLIPIDEMIA 07/03/2010  . HYPERSOMNIA 12/18/2010  . HYPERTENSION 06/27/2007  . Irritable bowel syndrome 04/28/2009  . ISCHEMIC COLITIS 04/28/2009  . OTITIS  MEDIA, BILATERAL 09/12/2008  . RECTAL BLEEDING 04/28/2009  . Vitamin D deficiency 03/23/2016   Past Surgical History  Procedure Laterality Date  . Breast biopsy  2005    Negative    reports that she has been smoking Cigarettes.  She has been smoking about 0.30 packs per day. She has never used smokeless tobacco. She reports that she does not drink alcohol or use illicit drugs. family history includes Breast cancer in her mother; Diabetes in her mother and paternal grandmother; Hypertension in her mother. Allergies  Allergen Reactions  . Amoxicillin-Pot Clavulanate     REACTION: nausea  . Clarithromycin     REACTION: Nausea  . Doxycycline     REACTION: severe fatigue per pt   Current Outpatient Prescriptions on File Prior to Visit  Medication Sig Dispense Refill  . amLODipine (NORVASC) 5 MG tablet Take 1 tablet (5 mg total) by mouth daily. 90 tablet 3  . aspirin ([facility] ASPIRIN LOW DOSE) 81 MG EC tablet Take 1 tablet (81 mg total) by mouth daily. Yearly physical w/labs due April must see md for refills 30 tablet 0  . erythromycin ophthalmic ointment Place 1 application into both eyes 4 (four) times daily. 3.5 g 1  . lisinopril (PRINIVIL,ZESTRIL) 40 MG tablet Take 1 tablet (40 mg total) by mouth daily. Yearly physical w/labs due April must see md for refills 30 tablet 0  . ondansetron (ZOFRAN) 4 MG tablet Take 1 tablet (4 mg total) by mouth every 8 (eight) hours as needed for nausea or vomiting. 40 tablet 0  . pantoprazole (PROTONIX) 40  MG tablet Take 1 tablet (40 mg total) by mouth daily. 90 tablet 3   No current facility-administered medications on file prior to visit.     Review of Systems Constitutional: Negative for increased diaphoresis, or other activity, appetite or siginficant weight change other than noted HENT: Negative for worsening hearing loss, ear pain, facial swelling, mouth sores and neck stiffness.   Eyes: Negative for other worsening pain, redness or visual disturbance.    Respiratory: Negative for choking or stridor Cardiovascular: Negative for other chest pain and palpitations.  Gastrointestinal: Negative for worsening diarrhea, blood in stool, or abdominal distention Genitourinary: Negative for hematuria, flank pain or change in urine volume.  Musculoskeletal: Negative for myalgias or other joint complaints.  Skin: Negative for other color change and wound or drainage.  Neurological: Negative for syncope and numbness. other than noted Hematological: Negative for adenopathy. or other swelling Psychiatric/Behavioral: Negative for hallucinations, SI, self-injury, decreased concentration or other worsening agitation.      Objective:   Physical Exam BP 136/82 mmHg  Pulse 81  Temp(Src) 98.6 F (37 C) (Oral)  Resp 20  Wt 164 lb (74.39 kg)  SpO2 97% VS noted,  Constitutional: Pt is oriented to person, place, and time. Appears well-developed and well-nourished, in no significant distress Head: Normocephalic and atraumatic  Eyes: Conjunctivae and EOM are normal. Pupils are equal, round, and reactive to light Right Ear: External ear normal.  Left Ear: External ear normal Nose: Nose normal.  Mouth/Throat: Oropharynx is clear and moist  Neck: Normal range of motion. Neck supple. No JVD present. No tracheal deviation present or significant neck LA or mass Cardiovascular: Normal rate, regular rhythm, normal heart sounds and intact distal pulses.   Pulmonary/Chest: Effort normal and breath sounds without rales or wheezing  Abdominal: Soft. Bowel sounds are normal. NT. No HSM  Musculoskeletal: Normal range of motion. Exhibits no edema Lymphadenopathy: Has no cervical adenopathy.  Neurological: Pt is alert and oriented to person, place, and time. Pt has normal reflexes. No cranial nerve deficit. Motor grossly intact Skin: Skin is warm and dry. No rash noted or new ulcers Psychiatric:  Has normal mood and affect. Behavior is normal.     Assessment & Plan:

## 2016-03-23 NOTE — Progress Notes (Signed)
Pre visit review using our clinic review tool, if applicable. No additional management support is needed unless otherwise documented below in the visit note. 

## 2016-03-23 NOTE — Addendum Note (Signed)
Addended by: Della Goo C on: 03/23/2016 01:52 PM   Modules accepted: Orders

## 2016-03-23 NOTE — Assessment & Plan Note (Signed)
stable overall by history and exam, recent data reviewed with pt, and pt to continue medical treatment as before,  to f/u any worsening symptoms or concerns BP Readings from Last 3 Encounters:  03/23/16 136/82  12/29/15 134/80  08/21/15 120/80

## 2016-03-23 NOTE — Assessment & Plan Note (Signed)

## 2016-03-23 NOTE — Assessment & Plan Note (Signed)
Mild worsening , off the previous statoin, does not want to take if not critical, for lower chol diet Lab Results  Component Value Date   LDLCALC 124* 03/21/2015  for f/u lab

## 2016-03-23 NOTE — Assessment & Plan Note (Signed)
For level, also for vit d 2000 units per day, may need high dose burst depending on level

## 2016-03-23 NOTE — Patient Instructions (Addendum)
You had the Tetanus Tdap shot today  Please take the OTC Vit D 2000 units per day, as well at least a multivitamin with iron.  Please continue all other medications as before, and refills have been done if requested.  Please have the pharmacy call with any other refills you may need.  Please continue your efforts at being more active, low cholesterol diet, and weight control.  You are otherwise up to date with prevention measures today.  Please keep your appointments with your specialists as you may have planned  Please go to the LAB in the Basement (turn left off the elevator) for the tests to be done today  You will be contacted by phone if any changes need to be made immediately.  Otherwise, you will receive a letter about your results with an explanation, but please check with MyChart first.  Please remember to sign up for MyChart if you have not done so, as this will be important to you in the future with finding out test results, communicating by private email, and scheduling acute appointments online when needed.  Please return in 1 year for your yearly visit, or sooner if needed, with Lab testing done 3-5 days before

## 2016-03-24 ENCOUNTER — Encounter: Payer: Self-pay | Admitting: Internal Medicine

## 2016-03-24 ENCOUNTER — Telehealth: Payer: Self-pay

## 2016-03-24 ENCOUNTER — Other Ambulatory Visit: Payer: Self-pay | Admitting: Internal Medicine

## 2016-03-24 MED ORDER — LISINOPRIL 40 MG PO TABS: 40.0000 mg | ORAL_TABLET | Freq: Every day | ORAL | Status: DC

## 2016-03-24 MED ORDER — VITAMIN D3 1.25 MG (50000 UT) PO TABS: 1.0000 | ORAL_TABLET | ORAL | Status: DC

## 2016-03-24 MED ORDER — ASPIRIN 81 MG PO TBEC: 81.0000 mg | DELAYED_RELEASE_TABLET | Freq: Every day | ORAL | Status: DC

## 2016-03-24 MED ORDER — ONDANSETRON HCL 4 MG PO TABS: 4.0000 mg | ORAL_TABLET | Freq: Three times a day (TID) | ORAL | Status: DC | PRN

## 2016-03-24 MED ORDER — PANTOPRAZOLE SODIUM 40 MG PO TBEC: 40.0000 mg | DELAYED_RELEASE_TABLET | Freq: Every day | ORAL | Status: DC

## 2016-03-24 MED ORDER — AMLODIPINE BESYLATE 5 MG PO TABS: 5.0000 mg | ORAL_TABLET | Freq: Every day | ORAL | Status: DC

## 2016-03-24 MED ORDER — ROSUVASTATIN CALCIUM 10 MG PO TABS: 10.0000 mg | ORAL_TABLET | Freq: Every day | ORAL | Status: DC

## 2016-03-24 NOTE — Telephone Encounter (Signed)
Medications sent to pharmacy

## 2016-04-22 ENCOUNTER — Other Ambulatory Visit: Payer: Self-pay | Admitting: Internal Medicine

## 2016-05-28 ENCOUNTER — Other Ambulatory Visit: Payer: Self-pay | Admitting: Internal Medicine

## 2016-05-28 MED ORDER — LISINOPRIL 40 MG PO TABS: ORAL_TABLET | ORAL | Status: DC

## 2016-05-28 NOTE — Telephone Encounter (Signed)
Pt had a cpe with Dr. Jenny Reichmann on 03/23/16. Please help

## 2016-05-28 NOTE — Telephone Encounter (Signed)
Done erx 

## 2016-06-14 ENCOUNTER — Telehealth: Payer: Self-pay | Admitting: Emergency Medicine

## 2016-06-14 NOTE — Telephone Encounter (Signed)
Pt called and wants to know if she can get something else to replace rosuvastatin (CRESTOR) 10 MG tablet. This prescription is too expensive. Please advise thanks.

## 2016-06-15 MED ORDER — ATORVASTATIN CALCIUM 20 MG PO TABS: 20.0000 mg | ORAL_TABLET | Freq: Every day | ORAL | 3 refills | Status: DC

## 2016-06-15 NOTE — Telephone Encounter (Signed)
Left patient message to give [place] a call back, advised her that we have sent her a new prescription in.

## 2016-06-15 NOTE — Telephone Encounter (Signed)
Ok to change crestor to lipitor 20 qd

## 2016-06-28 ENCOUNTER — Other Ambulatory Visit: Payer: Self-pay | Admitting: Internal Medicine

## 2016-08-24 ENCOUNTER — Other Ambulatory Visit: Payer: Self-pay | Admitting: Internal Medicine

## 2016-12-21 ENCOUNTER — Ambulatory Visit (INDEPENDENT_AMBULATORY_CARE_PROVIDER_SITE_OTHER): Payer: 59 | Admitting: Internal Medicine

## 2016-12-21 VITALS — BP 130/70 | HR 83 | Temp 98.8°F | Resp 20 | Wt 175.0 lb

## 2016-12-21 DIAGNOSIS — J019 Acute sinusitis, unspecified: Secondary | ICD-10-CM | POA: Diagnosis not present

## 2016-12-21 DIAGNOSIS — R21 Rash and other nonspecific skin eruption: Secondary | ICD-10-CM | POA: Diagnosis not present

## 2016-12-21 DIAGNOSIS — I10 Essential (primary) hypertension: Secondary | ICD-10-CM

## 2016-12-21 MED ORDER — CLOTRIMAZOLE-BETAMETHASONE 1-0.05 % EX CREA: TOPICAL_CREAM | CUTANEOUS | 1 refills | Status: DC

## 2016-12-21 MED ORDER — LEVOFLOXACIN 500 MG PO TABS: 500.0000 mg | ORAL_TABLET | Freq: Every day | ORAL | 0 refills | Status: AC

## 2016-12-21 NOTE — Assessment & Plan Note (Signed)
Mild to mod, for antibx course,  to f/u any worsening symptoms or concerns 

## 2016-12-21 NOTE — Progress Notes (Signed)
Pre visit review using our clinic review tool, if applicable. No additional management support is needed unless otherwise documented below in the visit note. 

## 2016-12-21 NOTE — Assessment & Plan Note (Signed)
stable overall by history and exam, recent data reviewed with pt, and pt to continue medical treatment as before,  to f/u any worsening symptoms or concerns BP Readings from Last 3 Encounters:  12/21/16 130/70  03/23/16 136/82  12/29/15 134/80

## 2016-12-21 NOTE — Patient Instructions (Signed)
Please take all new medication as prescribed - the antibiotic, and cream  Please continue all other medications as before, and refills have been done if requested.  Please have the pharmacy call with any other refills you may need.  Please keep your appointments with your specialists as you may have planned       

## 2016-12-21 NOTE — Assessment & Plan Note (Signed)
Mild to mod, for lotrisone asd prn,  to f/u any worsening symptoms or concerns

## 2016-12-21 NOTE — Progress Notes (Signed)
Subjective:    Patient ID: Sandy Hanna, female    DOB: [birth year], 51 y.o.   MRN: [id number]  HPI   Here with 2-3 days acute onset fever, facial pain, pressure, headache, general weakness and malaise, and greenish d/c, with mild ST and cough, but pt denies chest pain, wheezing, increased sob or doe, orthopnea, PND, increased LE swelling, palpitations, dizziness or syncope.  Pt denies new neurological symptoms such as new headache, or facial or extremity weakness or numbness   Pt denies polydipsia, polyuria.  Also has itchy scaly rash to area approx 3 cm nontender right lateral mid abdomen for several wks, just not getting better Past Medical History:  Diagnosis Date  . Acute sinusitis, unspecified 12/30/2007  . ALLERGIC RHINITIS 12/15/2007  . ANEMIA-NOS 12/18/2010  . ANXIETY 12/15/2007  . BLEPHARITIS, RIGHT 04/03/2008  . CHEST PAIN 08/25/2010  . DEPRESSION 12/15/2007  . FATIGUE 08/25/2010  . GERD 12/15/2007  . HYPERLIPIDEMIA 07/03/2010  . HYPERSOMNIA 12/18/2010  . HYPERTENSION 06/27/2007  . Irritable bowel syndrome 04/28/2009  . ISCHEMIC COLITIS 04/28/2009  . Migraines 12/23/2008  . OTITIS MEDIA, BILATERAL 09/12/2008  . RECTAL BLEEDING 04/28/2009  . Vitamin D deficiency 03/23/2016   Past Surgical History:  Procedure Laterality Date  . BREAST BIOPSY  2005   Negative    reports that she has been smoking Cigarettes.  She has been smoking about 0.30 packs per day. She has never used smokeless tobacco. She reports that she does not drink alcohol or use drugs. family history includes Breast cancer in her mother; Diabetes in her mother and paternal grandmother; Hypertension in her mother. Allergies  Allergen Reactions  . Amoxicillin-Pot Clavulanate     REACTION: nausea  . Clarithromycin     REACTION: Nausea  . Doxycycline     REACTION: severe fatigue per pt   Current Outpatient Prescriptions on File Prior to Visit  Medication Sig Dispense Refill  . amLODipine (NORVASC) 5 MG tablet Take 1 tablet  (5 mg total) by mouth daily. 90 tablet 3  . aspirin 81 MG EC tablet Take 1 tablet (81 mg total) by mouth daily. 30 tablet 5  . atorvastatin (LIPITOR) 20 MG tablet Take 1 tablet (20 mg total) by mouth daily. 90 tablet 3  . Cholecalciferol (VITAMIN D3) 50000 units TABS Take 1 tablet by mouth once a week. 12 tablet 0  . erythromycin ophthalmic ointment Place 1 application into both eyes 4 (four) times daily. 3.5 g 1  . lisinopril (PRINIVIL,ZESTRIL) 40 MG tablet TAKE 1 TABLET EVERY DAY 90 tablet 3  . ondansetron (ZOFRAN) 4 MG tablet Take 1 tablet (4 mg total) by mouth every 8 (eight) hours as needed for nausea or vomiting. 40 tablet 0  . pantoprazole (PROTONIX) 40 MG tablet TAKE 1 TABLET BY MOUTH EVERY DAY 90 tablet 2   No current facility-administered medications on file prior to visit.    Review of Systems  Constitutional: Negative for unusual diaphoresis or night sweats HENT: Negative for ear swelling or discharge Eyes: Negative for worsening visual haziness  Respiratory: Negative for choking and stridor.   Gastrointestinal: Negative for distension or worsening eructation Genitourinary: Negative for retention or change in urine volume.  Musculoskeletal: Negative for other MSK pain or swelling Skin: Negative for color change and worsening wound Neurological: Negative for tremors and numbness other than noted  Psychiatric/Behavioral: Negative for decreased concentration or agitation other than above   All other system neg per pt    Objective:   Physical  Exam BP 130/70   Pulse 83   Temp 98.8 F (37.1 C) (Oral)   Resp 20   Wt 175 lb (79.4 kg)   SpO2 98%   BMI 32.01 kg/m  VS noted, mild ill Constitutional: Pt appears in no apparent distress HENT: Head: NCAT.  Right Ear: External ear normal.  Left Ear: External ear normal.  Eyes: . Pupils are equal, round, and reactive to light. Conjunctivae and EOM are normal Bilat tm's with mild erythema.  Max sinus areas mild tender.  Pharynx  with mild erythema, no exudate Neck: Normal range of motion. Neck supple.  Cardiovascular: Normal rate and regular rhythm.   Pulmonary/Chest: Effort normal and breath sounds without rales or wheezing.  Neurological: Pt is alert. Not confused , motor grossly intact Skin: Skin is warm. Scaly nontender silvery dry rash to 3 cm area right mid lateral abd, no LE edema Psychiatric: Pt behavior is normal. No agitation.  No other new exam findings    Assessment & Plan:

## 2016-12-22 ENCOUNTER — Telehealth: Payer: Self-pay | Admitting: Internal Medicine

## 2016-12-22 MED ORDER — AZITHROMYCIN 250 MG PO TABS: ORAL_TABLET | ORAL | 1 refills | Status: DC

## 2016-12-22 NOTE — Telephone Encounter (Signed)
Pt called and said antibiotic she was prescribed yesterday is making her nauseous, she said normally a zpak works well for her, please call and let her know if you decide to change medication, she needs to give Corrine information about a new pharmacy, she is not in [place] today and would need to have it sent somewhere else, please advise MS

## 2016-12-22 NOTE — Telephone Encounter (Signed)
faxed

## 2016-12-22 NOTE — Telephone Encounter (Signed)
Done hardcopy to Corinne  

## 2017-01-13 ENCOUNTER — Ambulatory Visit (INDEPENDENT_AMBULATORY_CARE_PROVIDER_SITE_OTHER): Payer: 59 | Admitting: Internal Medicine

## 2017-01-13 ENCOUNTER — Encounter: Payer: Self-pay | Admitting: Internal Medicine

## 2017-01-13 VITALS — BP 136/82 | HR 78 | Temp 98.2°F | Wt 170.0 lb

## 2017-01-13 DIAGNOSIS — M545 Low back pain, unspecified: Secondary | ICD-10-CM | POA: Insufficient documentation

## 2017-01-13 DIAGNOSIS — I10 Essential (primary) hypertension: Secondary | ICD-10-CM | POA: Diagnosis not present

## 2017-01-13 DIAGNOSIS — G5603 Carpal tunnel syndrome, bilateral upper limbs: Secondary | ICD-10-CM | POA: Insufficient documentation

## 2017-01-13 MED ORDER — NAPROXEN 500 MG PO TABS: ORAL_TABLET | ORAL | 2 refills | Status: DC

## 2017-01-13 MED ORDER — CYCLOBENZAPRINE HCL 5 MG PO TABS: 5.0000 mg | ORAL_TABLET | Freq: Three times a day (TID) | ORAL | 1 refills | Status: DC | PRN

## 2017-01-13 NOTE — Assessment & Plan Note (Signed)
Mild, with tingling and numbness only, exam benign, ok for bilat wrist splints she has also used years ago with similar issue

## 2017-01-13 NOTE — Patient Instructions (Addendum)
Please take all new medication as prescribed - the anti inflammatory pain medication, and the muscle relaxer as needed  Please start regular walking at home for back support exercise and weight control  Please continue all other medications as before, and refills have been done if requested.  Please have the pharmacy call with any other refills you may need.  Please continue your efforts at being more active, low cholesterol diet, and weight control.  Please keep your appointments with your specialists as you may have planned

## 2017-01-13 NOTE — Assessment & Plan Note (Signed)
Hx and exam c/w rather marked initial MSK strain and spasm just from standing up from sitting;  No evidence of spine or radicular symptoms; improving already now mild to mod, d/w pt - for nsaid prn, muscle relaxer prn, for start regular exercise for lumbar support/muscle tone, do not feel pt needs GU or GI or imaging at this time,  to f/u any worsening symptoms or concerns

## 2017-01-13 NOTE — Progress Notes (Signed)
Subjective:    Patient ID: Sandy Hanna, female    DOB: 1966/09/07, 51 y.o.   MRN: [id number]  HPI  Here with 4 days onset lower back pain, severe to start now mild to mod without specific tx, started with just standing up from sitting position, no particular twisting, no heavy lifting or other unusual activity.  Constant, sharp, with radation to bilat paraspinal areas from the lumbar to high cervical.  Pt denies bowel or bladder change, fever, wt loss,  worsening pain/numbness/weakness, gait change or falls, but has had some recurrence of mild bilat CTS like numbness to left > right hands in the past weeks.  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  No regular exercise.  No prior hx of significant spine dz.  Past Medical History:  Diagnosis Date  . Acute sinusitis, unspecified 12/30/2007  . ALLERGIC RHINITIS 12/15/2007  . ANEMIA-NOS 12/18/2010  . ANXIETY 12/15/2007  . BLEPHARITIS, RIGHT 04/03/2008  . CHEST PAIN 08/25/2010  . DEPRESSION 12/15/2007  . FATIGUE 08/25/2010  . GERD 12/15/2007  . HYPERLIPIDEMIA 07/03/2010  . HYPERSOMNIA 12/18/2010  . HYPERTENSION 06/27/2007  . Irritable bowel syndrome 04/28/2009  . ISCHEMIC COLITIS 04/28/2009  . Migraines 12/23/2008  . OTITIS MEDIA, BILATERAL 09/12/2008  . RECTAL BLEEDING 04/28/2009  . Vitamin D deficiency 03/23/2016   Past Surgical History:  Procedure Laterality Date  . BREAST BIOPSY  2005   Negative    reports that she has been smoking Cigarettes.  She has been smoking about 0.30 packs per day. She has never used smokeless tobacco. She reports that she does not drink alcohol or use drugs. family history includes Breast cancer in her mother; Diabetes in her mother and paternal grandmother; Hypertension in her mother. Allergies  Allergen Reactions  . Amoxicillin-Pot Clavulanate     REACTION: nausea  . Clarithromycin     REACTION: Nausea  . Doxycycline     REACTION: severe fatigue per pt   Current Outpatient Prescriptions on File Prior to Visit    Medication Sig Dispense Refill  . amLODipine (NORVASC) 5 MG tablet Take 1 tablet (5 mg total) by mouth daily. 90 tablet 3  . aspirin 81 MG EC tablet Take 1 tablet (81 mg total) by mouth daily. 30 tablet 5  . atorvastatin (LIPITOR) 20 MG tablet Take 1 tablet (20 mg total) by mouth daily. 90 tablet 3  . Cholecalciferol (VITAMIN D3) 50000 units TABS Take 1 tablet by mouth once a week. 12 tablet 0  . clotrimazole-betamethasone (LOTRISONE) cream Use as directed twice per day as needed 15 g 1  . erythromycin ophthalmic ointment Place 1 application into both eyes 4 (four) times daily. 3.5 g 1  . lisinopril (PRINIVIL,ZESTRIL) 40 MG tablet TAKE 1 TABLET EVERY DAY 90 tablet 3  . ondansetron (ZOFRAN) 4 MG tablet Take 1 tablet (4 mg total) by mouth every 8 (eight) hours as needed for nausea or vomiting. 40 tablet 0  . pantoprazole (PROTONIX) 40 MG tablet TAKE 1 TABLET BY MOUTH EVERY DAY 90 tablet 2   No current facility-administered medications on file prior to visit.    Review of Systems  Constitutional: Negative for unusual diaphoresis or night sweats HENT: Negative for ear swelling or discharge Eyes: Negative for worsening visual haziness  Respiratory: Negative for choking and stridor.   Gastrointestinal: Negative for distension or worsening eructation Genitourinary: Negative for retention or change in urine volume.  Musculoskeletal: Negative for other MSK pain or swelling Skin: Negative for color change and  worsening wound Neurological: Negative for tremors and numbness other than noted  Psychiatric/Behavioral: Negative for decreased concentration or agitation other than above   All other system neg per pt    Objective:   Physical Exam BP 136/82   Pulse 78   Temp 98.2 F (36.8 C)   Wt 170 lb (77.1 kg)   SpO2 98%   BMI 31.09 kg/m  VS noted,  Constitutional: Pt appears in no apparent distress HENT: Head: NCAT.  Right Ear: External ear normal.  Left Ear: External ear normal.  Eyes: .  Pupils are equal, round, and reactive to light. Conjunctivae and EOM are normal Neck: Normal range of motion. Neck supple.  Cardiovascular: Normal rate and regular rhythm.   Pulmonary/Chest: Effort normal and breath sounds without rales or wheezing.  Abd:  Soft, NT, ND, + BS Spine: non tender specifically to midline throughout + for bilateral paraspinal MSK spasm right > left and more palpable at lumbar level but some to thoracic and cervical area as well. Neurological: Pt is alert. Not confused , motor grossly intact, sens intact to UE and LE's to LT Skin: Skin is warm. No rash, no LE edema Psychiatric: Pt behavior is normal. No agitation.  No other new exam findings    Assessment & Plan:

## 2017-01-13 NOTE — Assessment & Plan Note (Signed)
stable overall by history and exam, recent data reviewed with pt, and pt to continue medical treatment as before,  to f/u any worsening symptoms or concerns BP Readings from Last 3 Encounters:  01/13/17 136/82  12/21/16 130/70  03/23/16 136/82

## 2017-02-01 ENCOUNTER — Other Ambulatory Visit: Payer: Self-pay | Admitting: Internal Medicine

## 2017-02-07 ENCOUNTER — Ambulatory Visit (INDEPENDENT_AMBULATORY_CARE_PROVIDER_SITE_OTHER)
Admission: RE | Admit: 2017-02-07 | Discharge: 2017-02-07 | Disposition: A | Discharge status: Home / Self Care | Payer: 59 | Source: Ambulatory Visit | Attending: Nurse Practitioner | Admitting: Nurse Practitioner

## 2017-02-07 ENCOUNTER — Ambulatory Visit (INDEPENDENT_AMBULATORY_CARE_PROVIDER_SITE_OTHER): Payer: 59 | Admitting: Nurse Practitioner

## 2017-02-07 ENCOUNTER — Encounter: Payer: Self-pay | Admitting: Nurse Practitioner

## 2017-02-07 VITALS — BP 156/98 | HR 92 | Temp 98.1°F | Ht 62.0 in | Wt 170.0 lb

## 2017-02-07 DIAGNOSIS — R202 Paresthesia of skin: Secondary | ICD-10-CM

## 2017-02-07 NOTE — Progress Notes (Signed)
Subjective:  Patient ID: Sandy Hanna, female    DOB: [birth year]  Age: 51 y.o. MRN: [id number]  CC: Leg Pain (Pt stated LT thumb/leg have numbness feeling for 2 months) and Dizziness (intermittent.)   Dizziness  This is a new problem. The current episode started 1 to 4 weeks ago. The problem occurs intermittently. The problem has been waxing and waning. Associated symptoms include numbness. Pertinent negatives include no abdominal pain, anorexia, arthralgias, change in bowel habit, chest pain, chills, congestion, coughing, diaphoresis, fatigue, fever, headaches, joint swelling, myalgias, nausea, neck pain, rash, sore throat, swollen glands, urinary symptoms, vertigo, visual change, vomiting or weakness. Nothing aggravates the symptoms. She has tried nothing for the symptoms.  Leg Pain   Incident onset: onset 3weeks ago. There was no injury mechanism. Pain location: left calf and foot. Quality: numbness with sitting. The pain has been intermittent since onset. Associated symptoms include numbness. Pertinent negatives include no inability to bear weight, loss of motion, loss of sensation, muscle weakness or tingling. Exacerbated by: sitting. She has tried nothing for the symptoms.    Outpatient Medications Prior to Visit  Medication Sig Dispense Refill  . amLODipine (NORVASC) 5 MG tablet Take 1 tablet (5 mg total) by mouth daily. Yearly physical w/labs due in May must see MD for refills 90 tablet 0  . aspirin 81 MG EC tablet Take 1 tablet (81 mg total) by mouth daily. 30 tablet 5  . atorvastatin (LIPITOR) 20 MG tablet Take 1 tablet (20 mg total) by mouth daily. 90 tablet 3  . Cholecalciferol (VITAMIN D3) 50000 units TABS Take 1 tablet by mouth once a week. 12 tablet 0  . clotrimazole-betamethasone (LOTRISONE) cream Use as directed twice per day as needed 15 g 1  . cyclobenzaprine (FLEXERIL) 5 MG tablet Take 1 tablet (5 mg total) by mouth 3 (three) times daily as needed for muscle spasms. 40  tablet 1  . erythromycin ophthalmic ointment Place 1 application into both eyes 4 (four) times daily. 3.5 g 1  . lisinopril (PRINIVIL,ZESTRIL) 40 MG tablet TAKE 1 TABLET EVERY DAY 90 tablet 3  . naproxen (NAPROSYN) 500 MG tablet 1 tab by mouth twice per day as needed 60 tablet 2  . ondansetron (ZOFRAN) 4 MG tablet Take 1 tablet (4 mg total) by mouth every 8 (eight) hours as needed for nausea or vomiting. 40 tablet 0  . pantoprazole (PROTONIX) 40 MG tablet TAKE 1 TABLET BY MOUTH EVERY DAY 90 tablet 2   No facility-administered medications prior to visit.     ROS See HPI  Objective:  BP (!) 156/98   Pulse 92   Temp 98.1 F (36.7 C)   Ht 5\' 2"  (1.575 m)   Wt 170 lb (77.1 kg)   LMP 02/01/2017 (Approximate)   SpO2 97%   BMI 31.09 kg/m   BP Readings from Last 3 Encounters:  02/07/17 (!) 156/98  01/13/17 136/82  12/21/16 130/70    Wt Readings from Last 3 Encounters:  02/07/17 170 lb (77.1 kg)  01/13/17 170 lb (77.1 kg)  12/21/16 175 lb (79.4 kg)    Physical Exam  Constitutional: She is oriented to person, place, and time. No distress.  Neck: Normal range of motion. Neck supple. No thyromegaly present.  Cardiovascular: Normal rate, regular rhythm and normal heart sounds.   Pulmonary/Chest: Effort normal and breath sounds normal.  Abdominal: Soft. Bowel sounds are normal.  Musculoskeletal: Normal range of motion. She exhibits no edema or tenderness.  Negative straight  leg raise.  Lymphadenopathy:    She has no cervical adenopathy.  Neurological: She is alert and oriented to person, place, and time. She has normal reflexes. No cranial nerve deficit.  Skin: Skin is warm and dry. No rash noted. No erythema.  Vitals reviewed.   Lab Results  Component Value Date   WBC 9.7 03/23/2016   HGB 11.9 (L) 03/23/2016   HCT 37.0 03/23/2016   PLT 290.0 03/23/2016   GLUCOSE 91 03/23/2016   CHOL 234 (H) 03/23/2016   TRIG 78.0 03/23/2016   HDL 49.90 03/23/2016   LDLDIRECT 206.8  06/25/2010   LDLCALC 168 (H) 03/23/2016   ALT 11 03/23/2016   AST 12 03/23/2016   NA 138 03/23/2016   K 4.1 03/23/2016   CL 104 03/23/2016   CREATININE 0.72 03/23/2016   BUN 6 03/23/2016   CO2 26 03/23/2016   TSH 1.99 03/23/2016    No results found.  Assessment & Plan:   Alyn was seen today for leg pain and dizziness.  Diagnoses and all orders for this visit:  Left leg paresthesias -     DG Lumbar Spine Complete; Future -     CBC w/Diff; Future -     Antinuclear Antib (ANA); Future -     TSH; Future -     Basic metabolic panel; Future   I am having Ms. Guerrera maintain her erythromycin, Vitamin D3, ondansetron, lisinopril, atorvastatin, aspirin, pantoprazole, clotrimazole-betamethasone, naproxen, cyclobenzaprine, and amLODipine.  No orders of the defined types were placed in this encounter.   Follow-up: Return if symptoms worsen or fail to improve.  Wilfred Lacy, NP

## 2017-02-07 NOTE — Patient Instructions (Addendum)
Normal lumbar spine.  Labs ordered.   consider referral to neurologist if normal labs.

## 2017-02-08 NOTE — Progress Notes (Signed)
No acute findings. Return to lab for blood draw.

## 2017-02-09 ENCOUNTER — Encounter: Payer: Self-pay | Admitting: Internal Medicine

## 2017-02-09 ENCOUNTER — Ambulatory Visit (INDEPENDENT_AMBULATORY_CARE_PROVIDER_SITE_OTHER): Payer: 59 | Admitting: Internal Medicine

## 2017-02-09 ENCOUNTER — Other Ambulatory Visit (INDEPENDENT_AMBULATORY_CARE_PROVIDER_SITE_OTHER): Payer: 59

## 2017-02-09 VITALS — BP 138/80 | HR 72 | Temp 99.0°F | Ht 62.0 in | Wt 170.0 lb

## 2017-02-09 DIAGNOSIS — M5416 Radiculopathy, lumbar region: Secondary | ICD-10-CM | POA: Diagnosis not present

## 2017-02-09 DIAGNOSIS — R509 Fever, unspecified: Secondary | ICD-10-CM

## 2017-02-09 DIAGNOSIS — Z0001 Encounter for general adult medical examination with abnormal findings: Secondary | ICD-10-CM | POA: Diagnosis not present

## 2017-02-09 DIAGNOSIS — R350 Frequency of micturition: Secondary | ICD-10-CM | POA: Diagnosis not present

## 2017-02-09 DIAGNOSIS — I10 Essential (primary) hypertension: Secondary | ICD-10-CM

## 2017-02-09 LAB — SEDIMENTATION RATE: Sed Rate: 60 mm/hr — ABNORMAL HIGH (ref 0–30)

## 2017-02-09 LAB — CBC WITH DIFFERENTIAL/PLATELET
BASOS ABS: 0.1 10*3/uL (ref 0.0–0.1)
Basophils Relative: 1 % (ref 0.0–3.0)
EOS ABS: 0.1 10*3/uL (ref 0.0–0.7)
EOS PCT: 1.5 % (ref 0.0–5.0)
HCT: 39.2 % (ref 36.0–46.0)
Hemoglobin: 12.9 g/dL (ref 12.0–15.0)
LYMPHS ABS: 3.4 10*3/uL (ref 0.7–4.0)
Lymphocytes Relative: 45.2 % (ref 12.0–46.0)
MCHC: 32.8 g/dL (ref 30.0–36.0)
MCV: 83.4 fl (ref 78.0–100.0)
MONO ABS: 0.6 10*3/uL (ref 0.1–1.0)
Monocytes Relative: 7.6 % (ref 3.0–12.0)
NEUTROS PCT: 44.7 % (ref 43.0–77.0)
Neutro Abs: 3.4 10*3/uL (ref 1.4–7.7)
Platelets: 280 10*3/uL (ref 150.0–400.0)
RBC: 4.7 Mil/uL (ref 3.87–5.11)
RDW: 14.7 % (ref 11.5–15.5)
WBC: 7.6 10*3/uL (ref 4.0–10.5)

## 2017-02-09 LAB — BASIC METABOLIC PANEL
BUN: 7 mg/dL (ref 6–23)
CO2: 25 meq/L (ref 19–32)
Calcium: 9.6 mg/dL (ref 8.4–10.5)
Chloride: 106 mEq/L (ref 96–112)
Creatinine, Ser: 0.7 mg/dL (ref 0.40–1.20)
GFR: 113.26 mL/min (ref >60.00)
GLUCOSE: 88 mg/dL (ref 70–99)
POTASSIUM: 3.8 meq/L (ref 3.5–5.1)
SODIUM: 137 meq/L (ref 135–145)

## 2017-02-09 LAB — URINALYSIS, ROUTINE W REFLEX MICROSCOPIC
Bilirubin Urine: NEGATIVE
HGB URINE DIPSTICK: NEGATIVE
KETONES UR: NEGATIVE
Leukocytes, UA: NEGATIVE
Nitrite: NEGATIVE
PH: 7.5 (ref 5.0–8.0)
Specific Gravity, Urine: 1.01 (ref 1.000–1.030)
TOTAL PROTEIN, URINE-UPE24: NEGATIVE
URINE GLUCOSE: NEGATIVE
UROBILINOGEN UA: 0.2 (ref 0.0–1.0)

## 2017-02-09 LAB — HEPATIC FUNCTION PANEL
ALBUMIN: 4.3 g/dL (ref 3.5–5.2)
ALK PHOS: 105 U/L (ref 39–117)
ALT: 10 U/L (ref 0–35)
AST: 13 U/L (ref 0–37)
Bilirubin, Direct: 0 mg/dL (ref 0.0–0.3)
TOTAL PROTEIN: 7.5 g/dL (ref 6.0–8.3)
Total Bilirubin: 0.3 mg/dL (ref 0.2–1.2)

## 2017-02-09 LAB — LIPID PANEL
CHOLESTEROL: 183 mg/dL (ref 0–200)
HDL: 43.4 mg/dL (ref >39.00)
LDL Cholesterol: 119 mg/dL — ABNORMAL HIGH (ref 0–99)
NonHDL: 139.57
Total CHOL/HDL Ratio: 4
Triglycerides: 103 mg/dL (ref 0.0–149.0)
VLDL: 20.6 mg/dL (ref 0.0–40.0)

## 2017-02-09 LAB — C-REACTIVE PROTEIN: CRP: 0.5 mg/dL (ref 0.5–20.0)

## 2017-02-09 LAB — TSH: TSH: 1.37 u[IU]/mL (ref 0.35–4.50)

## 2017-02-09 MED ORDER — PREDNISONE 10 MG PO TABS: ORAL_TABLET | ORAL | 0 refills | Status: DC

## 2017-02-09 MED ORDER — LEVOFLOXACIN 500 MG PO TABS: 500.0000 mg | ORAL_TABLET | Freq: Every day | ORAL | 0 refills | Status: AC

## 2017-02-09 NOTE — Patient Instructions (Addendum)
Please take all new medication as prescribed  - the antibiotic, and prednisone  Please continue all other medications as before, and refills have been done if requested.  Please have the pharmacy call with any other refills you may need..  Please keep your appointments with your specialists as you may have planned  Please go to the LAB in the Basement (turn left off the elevator) for the tests to be done today  You will be contacted by phone if any changes need to be made immediately.  Otherwise, you will receive a letter about your results with an explanation, but please check with MyChart first.  Please remember to sign up for MyChart if you have not done so, as this will be important to you in the future with finding out test results, communicating by private email, and scheduling acute appointments online when needed.

## 2017-02-09 NOTE — Progress Notes (Signed)
Subjective:    Patient ID: Sandy Hanna, female    DOB: Apr 14, 1966, 51 y.o.   MRN: [id number]  HPI  Here to f/u with c/o left lower back pain intermitent mild for 2 wks with radiation to the left upper leg, but without bowel or bladder change, fever, wt loss,  worsening LE weakness, gait change or falls.  Incidentally has also had mild urinary symptoms such as dysuria, frequency, but no urgency, flank pain, hematuria or n/v, fever, chills.  Labs ordered not yet doe from last visit   Pt denies polydipsia, polyuria,  Has also cramping recurrent to abd for several months, but Denies worsening reflux, dysphagia, n/v, bowel change or blood. Also  Here with 2-3 days acute onset fever, facial pain, pressure, headache, general weakness and malaise, and clear d/c, with mild ST and cough Past Medical History:  Diagnosis Date  . Acute sinusitis, unspecified 12/30/2007  . ALLERGIC RHINITIS 12/15/2007  . ANEMIA-NOS 12/18/2010  . ANXIETY 12/15/2007  . BLEPHARITIS, RIGHT 04/03/2008  . CHEST PAIN 08/25/2010  . DEPRESSION 12/15/2007  . FATIGUE 08/25/2010  . GERD 12/15/2007  . HYPERLIPIDEMIA 07/03/2010  . HYPERSOMNIA 12/18/2010  . HYPERTENSION 06/27/2007  . Irritable bowel syndrome 04/28/2009  . ISCHEMIC COLITIS 04/28/2009  . Migraines 12/23/2008  . OTITIS MEDIA, BILATERAL 09/12/2008  . RECTAL BLEEDING 04/28/2009  . Vitamin D deficiency 03/23/2016   Past Surgical History:  Procedure Laterality Date  . BREAST BIOPSY  2005   Negative    reports that she has been smoking Cigarettes.  She has been smoking about 0.30 packs per day. She has never used smokeless tobacco. She reports that she does not drink alcohol or use drugs. family history includes Breast cancer in her mother; Diabetes in her mother and paternal grandmother; Hypertension in her mother. Allergies  Allergen Reactions  . Amoxicillin-Pot Clavulanate     REACTION: nausea  . Clarithromycin     REACTION: Nausea  . Doxycycline     REACTION: severe fatigue  per pt   Current Outpatient Prescriptions on File Prior to Visit  Medication Sig Dispense Refill  . amLODipine (NORVASC) 5 MG tablet Take 1 tablet (5 mg total) by mouth daily. Yearly physical w/labs due in May must see MD for refills 90 tablet 0  . aspirin 81 MG EC tablet Take 1 tablet (81 mg total) by mouth daily. 30 tablet 5  . atorvastatin (LIPITOR) 20 MG tablet Take 1 tablet (20 mg total) by mouth daily. 90 tablet 3  . Cholecalciferol (VITAMIN D3) 50000 units TABS Take 1 tablet by mouth once a week. 12 tablet 0  . clotrimazole-betamethasone (LOTRISONE) cream Use as directed twice per day as needed 15 g 1  . cyclobenzaprine (FLEXERIL) 5 MG tablet Take 1 tablet (5 mg total) by mouth 3 (three) times daily as needed for muscle spasms. 40 tablet 1  . erythromycin ophthalmic ointment Place 1 application into both eyes 4 (four) times daily. 3.5 g 1  . lisinopril (PRINIVIL,ZESTRIL) 40 MG tablet TAKE 1 TABLET EVERY DAY 90 tablet 3  . naproxen (NAPROSYN) 500 MG tablet 1 tab by mouth twice per day as needed 60 tablet 2  . ondansetron (ZOFRAN) 4 MG tablet Take 1 tablet (4 mg total) by mouth every 8 (eight) hours as needed for nausea or vomiting. 40 tablet 0  . pantoprazole (PROTONIX) 40 MG tablet TAKE 1 TABLET BY MOUTH EVERY DAY 90 tablet 2   No current facility-administered medications on file prior to visit.  Review of Systems  Constitutional: Negative for unusual diaphoresis or night sweats HENT: Negative for ear swelling or discharge Eyes: Negative for worsening visual haziness  Respiratory: Negative for choking and stridor.   Gastrointestinal: Negative for distension or worsening eructation Genitourinary: Negative for retention or change in urine volume.  Musculoskeletal: Negative for other MSK pain or swelling Skin: Negative for color change and worsening wound Neurological: Negative for tremors and numbness other than noted  Psychiatric/Behavioral: Negative for decreased concentration or  agitation other than above   All other system neg per pt    Objective:   Physical Exam BP 138/80   Pulse 72   Temp 99 F (37.2 C) (Oral)   Ht 5\' 2"  (1.575 m)   Wt 170 lb (77.1 kg)   LMP 02/01/2017 (Approximate)   SpO2 98%   BMI 31.09 kg/m  VS noted,  Constitutional: Pt appears in no apparent distress HENT: Head: NCAT.  Right Ear: External ear normal.  Left Ear: External ear normal.  Eyes: . Pupils are equal, round, and reactive to light. Conjunctivae and EOM are normal Neck: Normal range of motion. Neck supple.  Cardiovascular: Normal rate and regular rhythm.   Pulmonary/Chest: Effort normal and breath sounds without rales or wheezing.  Abd:  Soft, ND, + BS, tender low mid abd, no guarding or rebound, no flank tender Spine: nontender Neurological: Pt is alert. Not confused , motor 5/5 intact except trace weakness LLE Skin: Skin is warm. No rash, no LE edema Psychiatric: Pt behavior is normal. No agitation. No other exam findings   CLINICAL DATA:  Numbness and tingling. EXAM: LUMBAR SPINE - COMPLETE 4+ VIEW - summary - 02/07/2017 IMPRESSION: 1. No acute bony abnormality identified. 2. Aortoiliac atherosclerotic vascular disease.      Assessment & Plan:

## 2017-02-09 NOTE — Progress Notes (Signed)
Pre visit review using our clinic review tool, if applicable. No additional management support is needed unless otherwise documented below in the visit note. 

## 2017-02-10 LAB — URINE CULTURE

## 2017-02-13 NOTE — Assessment & Plan Note (Signed)
c/w mild URI, likely viral, for mucinex otc prn

## 2017-02-13 NOTE — Assessment & Plan Note (Signed)
stable overall by history and exam, recent data reviewed with pt, and pt to continue medical treatment as before,  to f/u any worsening symptoms or concerns BP Readings from Last 3 Encounters:  02/09/17 138/80  02/07/17 (!) 156/98  01/13/17 136/82

## 2017-02-13 NOTE — Assessment & Plan Note (Signed)
c/w prob cystisis, for levaquin asd, urine culture f/u results

## 2017-02-13 NOTE — Assessment & Plan Note (Signed)
Very Mild, incidental, for predpac asd, consider MRI if worse or persists

## 2017-03-24 ENCOUNTER — Ambulatory Visit (INDEPENDENT_AMBULATORY_CARE_PROVIDER_SITE_OTHER): Payer: 59 | Admitting: Internal Medicine

## 2017-03-24 ENCOUNTER — Encounter: Payer: Self-pay | Admitting: Internal Medicine

## 2017-03-24 VITALS — BP 116/70 | HR 79 | Ht 62.0 in | Wt 171.0 lb

## 2017-03-24 DIAGNOSIS — Z114 Encounter for screening for human immunodeficiency virus [HIV]: Secondary | ICD-10-CM

## 2017-03-24 DIAGNOSIS — Z0000 Encounter for general adult medical examination without abnormal findings: Secondary | ICD-10-CM

## 2017-03-24 DIAGNOSIS — M109 Gout, unspecified: Secondary | ICD-10-CM | POA: Insufficient documentation

## 2017-03-24 DIAGNOSIS — E785 Hyperlipidemia, unspecified: Secondary | ICD-10-CM

## 2017-03-24 MED ORDER — PREDNISONE 10 MG PO TABS: ORAL_TABLET | ORAL | 0 refills | Status: DC

## 2017-03-24 MED ORDER — PANTOPRAZOLE SODIUM 40 MG PO TBEC: 40.0000 mg | DELAYED_RELEASE_TABLET | Freq: Every day | ORAL | 3 refills | Status: DC

## 2017-03-24 MED ORDER — ERYTHROMYCIN 5 MG/GM OP OINT: 1.0000 "application " | TOPICAL_OINTMENT | Freq: Four times a day (QID) | OPHTHALMIC | 1 refills | Status: DC

## 2017-03-24 MED ORDER — AMLODIPINE BESYLATE 5 MG PO TABS: 5.0000 mg | ORAL_TABLET | Freq: Every day | ORAL | 3 refills | Status: DC

## 2017-03-24 MED ORDER — LISINOPRIL 40 MG PO TABS: ORAL_TABLET | ORAL | 3 refills | Status: DC

## 2017-03-24 MED ORDER — ATORVASTATIN CALCIUM 20 MG PO TABS: 20.0000 mg | ORAL_TABLET | Freq: Every day | ORAL | 3 refills | Status: DC

## 2017-03-24 NOTE — Assessment & Plan Note (Signed)
New onset, for predpac asd

## 2017-03-24 NOTE — Assessment & Plan Note (Signed)
stable overall by history and exam, recent data reviewed with pt, and pt to continue medical treatment as before,  to f/u any worsening symptoms or concerns, Lab Results  Component Value Date   LDLCALC 119 (H) 02/09/2017   To take statin every day instead of qod for goal ldl < 100

## 2017-03-24 NOTE — Patient Instructions (Signed)
Please take all new medication as prescribed - the prednisone  Please continue all other medications as before, and refills have been done if requested.  Please have the pharmacy call with any other refills you may need.  Please continue your efforts at being more active, low cholesterol diet, and weight control.  You are otherwise up to date with prevention measures today.  Please keep your appointments with your specialists as you may have planned  Please return in 1 year for your yearly visit, or sooner if needed, with Lab testing done 3-5 days before

## 2017-03-24 NOTE — Assessment & Plan Note (Signed)

## 2017-03-24 NOTE — Progress Notes (Signed)
Pre visit review using our clinic review tool, if applicable. No additional management support is needed unless otherwise documented below in the visit note. 

## 2017-03-24 NOTE — Progress Notes (Signed)
Subjective:    Patient ID: Sandy Hanna, female    DOB: 07/28/1966, 51 y.o.   MRN: [id number]  HPI Here for wellness and f/u;  Overall doing ok;  Pt denies Chest pain, worsening SOB, DOE, wheezing, orthopnea, PND, worsening LE edema, palpitations, dizziness or syncope.  Pt denies neurological change such as new headache, facial or extremity weakness.  Pt denies polydipsia, polyuria, or low sugar symptoms. Pt states overall good compliance with treatment and medications, good tolerability, and has been trying to follow appropriate diet.  Pt denies worsening depressive symptoms, suicidal ideation or panic. No fever, night sweats, wt loss, loss of appetite, or other constitutional symptoms.  Pt states good ability with ADL's, has low fall risk, home safety reviewed and adequate, no other significant changes in hearing or vision, and only occasionally active with exercise. Has mammogram and pap sched for July Incidentally also here today with bilat first MTP left > right new onset 2 days red, tender, swelling.  Recent sed rate 60  Past Medical History:  Diagnosis Date  . Acute sinusitis, unspecified 12/30/2007  . ALLERGIC RHINITIS 12/15/2007  . ANEMIA-NOS 12/18/2010  . ANXIETY 12/15/2007  . BLEPHARITIS, RIGHT 04/03/2008  . CHEST PAIN 08/25/2010  . DEPRESSION 12/15/2007  . FATIGUE 08/25/2010  . GERD 12/15/2007  . HYPERLIPIDEMIA 07/03/2010  . HYPERSOMNIA 12/18/2010  . HYPERTENSION 06/27/2007  . Irritable bowel syndrome 04/28/2009  . ISCHEMIC COLITIS 04/28/2009  . Migraines 12/23/2008  . OTITIS MEDIA, BILATERAL 09/12/2008  . RECTAL BLEEDING 04/28/2009  . Vitamin D deficiency 03/23/2016   Past Surgical History:  Procedure Laterality Date  . BREAST BIOPSY  2005   Negative    reports that she has been smoking Cigarettes.  She has been smoking about 0.30 packs per day. She has never used smokeless tobacco. She reports that she does not drink alcohol or use drugs. family history includes Breast cancer in her  mother; Diabetes in her mother and paternal grandmother; Hypertension in her mother. Allergies  Allergen Reactions  . Amoxicillin-Pot Clavulanate     REACTION: nausea  . Clarithromycin     REACTION: Nausea  . Doxycycline     REACTION: severe fatigue per pt   Current Outpatient Prescriptions on File Prior to Visit  Medication Sig Dispense Refill  . aspirin 81 MG EC tablet Take 1 tablet (81 mg total) by mouth daily. 30 tablet 5  . Cholecalciferol (VITAMIN D3) 50000 units TABS Take 1 tablet by mouth once a week. 12 tablet 0  . clotrimazole-betamethasone (LOTRISONE) cream Use as directed twice per day as needed 15 g 1  . cyclobenzaprine (FLEXERIL) 5 MG tablet Take 1 tablet (5 mg total) by mouth 3 (three) times daily as needed for muscle spasms. 40 tablet 1  . naproxen (NAPROSYN) 500 MG tablet 1 tab by mouth twice per day as needed 60 tablet 2  . ondansetron (ZOFRAN) 4 MG tablet Take 1 tablet (4 mg total) by mouth every 8 (eight) hours as needed for nausea or vomiting. 40 tablet 0   No current facility-administered medications on file prior to visit.     Review of Systems Constitutional: Negative for other unusual diaphoresis, sweats, appetite or weight changes HENT: Negative for other worsening hearing loss, ear pain, facial swelling, mouth sores or neck stiffness.   Eyes: Negative for other worsening pain, redness or other visual disturbance.  Respiratory: Negative for other stridor or swelling Cardiovascular: Negative for other palpitations or other chest pain  Gastrointestinal: Negative for worsening  diarrhea or loose stools, blood in stool, distention or other pain Genitourinary: Negative for hematuria, flank pain or other change in urine volume.  Musculoskeletal: Negative for myalgias or other joint swelling.  Skin: Negative for other color change, or other wound or worsening drainage.  Neurological: Negative for other syncope or numbness. Hematological: Negative for other  adenopathy or swelling Psychiatric/Behavioral: Negative for hallucinations, other worsening agitation, SI, self-injury, or new decreased concentration All other system neg per pt    Objective:   Physical Exam BP 116/70   Pulse 79   Ht 5\' 2"  (1.575 m)   Wt 171 lb (77.6 kg)   SpO2 100%   BMI 31.28 kg/m  VS noted,  Constitutional: Pt is oriented to person, place, and time. Appears well-developed and well-nourished, in no significant distress and comfortable Head: Normocephalic and atraumatic  Eyes: Conjunctivae and EOM are normal. Pupils are equal, round, and reactive to light Right Ear: External ear normal without discharge Left Ear: External ear normal without discharge Nose: Nose without discharge or deformity Mouth/Throat: Oropharynx is without other ulcerations and moist  Neck: Normal range of motion. Neck supple. No JVD present. No tracheal deviation present or significant neck LA or mass Cardiovascular: Normal rate, regular rhythm, normal heart sounds and intact distal pulses.   Pulmonary/Chest: WOB normal and breath sounds without rales or wheezing  Abdominal: Soft. Bowel sounds are normal. NT. No HSM  Musculoskeletal: Normal range of motion. Exhibits no edema, except for bilat first MTP's left > right red/warm/tender  Lymphadenopathy: Has no other cervical adenopathy. , but has 2 < 1/2 cm LN noted right axilla non tender  Neurological: Pt is alert and oriented to person, place, and time. Pt has normal reflexes. No cranial nerve deficit. Motor grossly intact, Gait intact Skin: Skin is warm and dry. No rash noted or new ulcerations Psychiatric:  Has normal mood and affect. Behavior is normal without agitation No other exam findings  Lab Results  Component Value Date   WBC 7.6 02/09/2017   HGB 12.9 02/09/2017   HCT 39.2 02/09/2017   PLT 280.0 02/09/2017   GLUCOSE 88 02/09/2017   CHOL 183 02/09/2017   TRIG 103.0 02/09/2017   HDL 43.40 02/09/2017   LDLDIRECT 206.8  06/25/2010   LDLCALC 119 (H) 02/09/2017   ALT 10 02/09/2017   AST 13 02/09/2017   NA 137 02/09/2017   K 3.8 02/09/2017   CL 106 02/09/2017   CREATININE 0.70 02/09/2017   BUN 7 02/09/2017   CO2 25 02/09/2017   TSH 1.37 02/09/2017       Assessment & Plan:

## 2017-04-06 ENCOUNTER — Telehealth: Payer: Self-pay | Admitting: Internal Medicine

## 2017-04-06 NOTE — Telephone Encounter (Signed)
OK to take every other day, thanks

## 2017-04-06 NOTE — Telephone Encounter (Signed)
Pt states she was taking her cholesterol every other day but then was told to take everyday. She states she is dizzy and would like to know if she can go back to taking every other day or get a new Rx. She did not know the name of med.  [facility] [address] in [place]

## 2017-04-07 NOTE — Telephone Encounter (Signed)
Pt has been informed and expressed understanding.  

## 2017-05-10 ENCOUNTER — Other Ambulatory Visit: Payer: Self-pay | Admitting: Internal Medicine

## 2017-05-16 ENCOUNTER — Telehealth: Payer: Self-pay | Admitting: Internal Medicine

## 2017-05-16 NOTE — Telephone Encounter (Signed)
ASE NOTE: All timestamps contained within this report are represented as [place] Standard Time. CONFIDENTIALTY NOTICE: This fax transmission is intended only for the addressee. It contains information that is legally privileged, confidential or otherwise protected from use or disclosure. If you are not the intended recipient, you are strictly prohibited from reviewing, disclosing, copying using or disseminating any of this information or taking any action in reliance on or regarding this information. If you have received this fax in error, please notify us immediately by telephone so that we can arrange for its return to [place]. Phone: [phone], Toll-Free: [phone], Fax: [phone] Page: 1 of 1 Call Id: [id number] [facility] - Client [place] Patient Name: Sandy Hanna DOB: 21-Apr-1966 Initial Comment left foot, eye swollen, pain in left arm Nurse Assessment Nurse: Markus Daft, RN, Sherre Poot Date/Time (Eastern Time): 05/16/2017 12:37:25 PM Confirm and document reason for call. If symptomatic, describe symptoms. ---Caller states that she is having left foot and left eye swelling, and pain in left arm around the elbow. Started yesterday with waking up noticed eye was swollen. No pain in the eye or irritation. Swelling in foot is off/on, and noticed again yesterday. Arm/elbow pain for over a month noticed with lifting something or certain movements. No injuries. Does the patient have any new or worsening symptoms? ---Yes Will a triage be completed? ---Yes Related visit to physician within the last 2 weeks? ---No Does the PT have any chronic conditions? (i.e. diabetes, asthma, etc.) ---Yes List chronic conditions. ---HTN, high cholesterol Is the patient pregnant or possibly pregnant? (Ask all females between the ages of 51-55) ---No Is this a behavioral health or substance abuse call? ---No Guidelines Guideline Title Affirmed  Question Affirmed Notes Elbow Pain [1] MILD pain (e.g., does not interfere with normal activities) AND [2] present > 7 days Leg Swelling and Edema [1] Thigh, calf, or ankle swelling AND [2] only 1 side Final Disposition User See Physician within 4 Hours (or PCP triage) Markus Daft, RN, [place]y Comments Caller states that she has appt at 11 am tomorrow with Dr. [name] and she did not want the RN to try and find an appt for today. Referrals GO TO FACILITY REFUSED Disagree/Comply: Comply Disagree/Comply: Comply

## 2017-05-17 ENCOUNTER — Ambulatory Visit (INDEPENDENT_AMBULATORY_CARE_PROVIDER_SITE_OTHER): Payer: 59 | Admitting: Internal Medicine

## 2017-05-17 ENCOUNTER — Encounter: Payer: Self-pay | Admitting: Internal Medicine

## 2017-05-17 VITALS — BP 130/82 | HR 90 | Ht 62.0 in | Wt 186.0 lb

## 2017-05-17 DIAGNOSIS — M79672 Pain in left foot: Secondary | ICD-10-CM

## 2017-05-17 DIAGNOSIS — M79602 Pain in left arm: Secondary | ICD-10-CM | POA: Insufficient documentation

## 2017-05-17 DIAGNOSIS — I10 Essential (primary) hypertension: Secondary | ICD-10-CM | POA: Diagnosis not present

## 2017-05-17 DIAGNOSIS — T783XXA Angioneurotic edema, initial encounter: Secondary | ICD-10-CM | POA: Diagnosis not present

## 2017-05-17 MED ORDER — TRAMADOL HCL 50 MG PO TABS: 50.0000 mg | ORAL_TABLET | Freq: Three times a day (TID) | ORAL | 0 refills | Status: DC | PRN

## 2017-05-17 MED ORDER — METHYLPREDNISOLONE ACETATE 80 MG/ML IJ SUSP
80.0000 mg | Freq: Once | INTRAMUSCULAR | Status: AC
  Administered 2017-05-17: 80 mg via INTRAMUSCULAR

## 2017-05-17 MED ORDER — PREDNISONE 10 MG PO TABS: ORAL_TABLET | ORAL | 0 refills | Status: DC

## 2017-05-17 MED ORDER — AMLODIPINE BESYLATE 10 MG PO TABS: 10.0000 mg | ORAL_TABLET | Freq: Every day | ORAL | 3 refills | Status: DC

## 2017-05-17 NOTE — Patient Instructions (Addendum)
You had the steroid shot today  Please take all new medication as prescribed - the prednisone, and the pain medication as needed  Please call for appt with Dr Tamala Julian in 1-2 wks if not improved  Please STOP the Lisinopril  OK to increase the amlodipine to 10 mg per day for blood pressure  Please continue all other medications as before, and refills have been done if requested.  Please have the pharmacy call with any other refills you may need.  Please keep your appointments with your specialists as you may have planned

## 2017-05-17 NOTE — Assessment & Plan Note (Signed)
Suspect this may be ACE related angioedema; to dc the lisinopril, for depomedrol IM 80, predpac asd,  to f/u any worsening symptoms or concerns

## 2017-05-17 NOTE — Assessment & Plan Note (Signed)
Rather severe, appears  Non inflammatory except for pain and swelling so doubt gout, for steroid tx as above, and contd rest of the foot from treadmill, and if not improved to see sports medicine in 1 wk

## 2017-05-17 NOTE — Progress Notes (Signed)
Subjective:    Patient ID: Sandy Hanna, female    DOB: Dec 11, 1965, 51 y.o.   MRN: [id number]  HPI   Here to f/u with several complaints  1) 3 days onset mild to mod left upper > lower eyelid swelling with itching but little to no discomfort; denies conjunctival d/c or discomfort except did have some minor matting this am on waking up; no vision change, no HA, eye pain or fever.    2) c/o left arm pain x 2-3 wks without swelling with aching to the distal LUE just prox to the elbow joint, mostly anterior, not really posterior.  Hold a phone with the left arm in an elevated position intermittent throughout the day for word, no neck pain, other radicular pain or numbness or weakness  3)  10 days onset mod to severe Left foot pain and swelling worst to the distal foot, mostly at the great toe MTP, worse after walking on incline on treadmill for exercise several days, but not getting better as fast as she thought with no treadmill use in the past week.  No trauma, fever, skin change or heat to the area  Past Medical History:  Diagnosis Date  . Acute sinusitis, unspecified 12/30/2007  . ALLERGIC RHINITIS 12/15/2007  . ANEMIA-NOS 12/18/2010  . ANXIETY 12/15/2007  . BLEPHARITIS, RIGHT 04/03/2008  . CHEST PAIN 08/25/2010  . DEPRESSION 12/15/2007  . FATIGUE 08/25/2010  . GERD 12/15/2007  . HYPERLIPIDEMIA 07/03/2010  . HYPERSOMNIA 12/18/2010  . HYPERTENSION 06/27/2007  . Irritable bowel syndrome 04/28/2009  . ISCHEMIC COLITIS 04/28/2009  . Migraines 12/23/2008  . OTITIS MEDIA, BILATERAL 09/12/2008  . RECTAL BLEEDING 04/28/2009  . Vitamin D deficiency 03/23/2016   Past Surgical History:  Procedure Laterality Date  . BREAST BIOPSY  2005   Negative    reports that she has been smoking Cigarettes.  She has been smoking about 0.30 packs per day. She has never used smokeless tobacco. She reports that she does not drink alcohol or use drugs. family history includes Breast cancer in her mother; Diabetes in her  mother and paternal grandmother; Hypertension in her mother. Allergies  Allergen Reactions  . Amoxicillin-Pot Clavulanate     REACTION: nausea  . Clarithromycin     REACTION: Nausea  . Doxycycline     REACTION: severe fatigue per pt  . Lisinopril Other (See Comments)    Angioedema left upper and lower lids   Current Outpatient Prescriptions on File Prior to Visit  Medication Sig Dispense Refill  . aspirin 81 MG EC tablet Take 1 tablet (81 mg total) by mouth daily. 30 tablet 5  . atorvastatin (LIPITOR) 20 MG tablet Take 1 tablet (20 mg total) by mouth daily. 90 tablet 3  . Cholecalciferol (VITAMIN D3) 50000 units TABS Take 1 tablet by mouth once a week. 12 tablet 0  . clotrimazole-betamethasone (LOTRISONE) cream Use as directed twice per day as needed 15 g 1  . cyclobenzaprine (FLEXERIL) 5 MG tablet Take 1 tablet (5 mg total) by mouth 3 (three) times daily as needed for muscle spasms. 40 tablet 1  . erythromycin ophthalmic ointment Place 1 application into both eyes 4 (four) times daily. 3.5 g 1  . naproxen (NAPROSYN) 500 MG tablet 1 tab by mouth twice per day as needed 60 tablet 2  . ondansetron (ZOFRAN) 4 MG tablet Take 1 tablet (4 mg total) by mouth every 8 (eight) hours as needed for nausea or vomiting. 40 tablet 0  . pantoprazole (  PROTONIX) 40 MG tablet Take 1 tablet (40 mg total) by mouth daily. 90 tablet 3   No current facility-administered medications on file prior to visit.    Review of Systems  Constitutional: Negative for other unusual diaphoresis or sweats HENT: Negative for ear discharge or swelling Eyes: Negative for other worsening visual disturbances Respiratory: Negative for stridor or other swelling  Gastrointestinal: Negative for worsening distension or other blood Genitourinary: Negative for retention or other urinary change Musculoskeletal: Negative for other MSK pain or swelling Skin: Negative for color change or other new lesions Neurological: Negative for  worsening tremors and other numbness  Psychiatric/Behavioral: Negative for worsening agitation or other fatigue All other exam findings    Objective:   Physical Exam BP 130/82   Pulse 90   Ht 5\' 2"  (1.575 m)   Wt 186 lb (84.4 kg)   SpO2 100%   BMI 34.02 kg/m  VS noted, not ill appearing Constitutional: Pt appears in NAD HENT: Head: NCAT.  Right Ear: External ear normal.  Left Ear: External ear normal.  Eyes: . Pupils are equal, round, and reactive to light. Conjunctivae and EOM are normal, but left upper lid and left lower lid are 1-2+ upper > lower non tender swelling with mild erythema only; no fluctuance or drainage Left arm with tender to both of the bilat bicep insertion sites without skin change, redness or swelling Nose: without d/c or deformity Neck: Neck supple. Gross normal ROM Cardiovascular: Normal rate and regular rhythm.   Pulmonary/Chest: Effort normal and breath sounds without rales or wheezing.  Left foot with 2-3+ tender swelling to distal left foot to the midfoot, and first MTP primarily the site it seems of most pain and swelling, foot o/w neurovascular intact; No skin change or even warmth to the foot noted Neurological: Pt is alert. At baseline orientation, motor grossly intact Skin: Skin is warm. No rashes, other new lesions, no LE edema Psychiatric: Pt behavior is normal without agitation  No other exam findings  Lab Results  Component Value Date   WBC 7.6 02/09/2017   HGB 12.9 02/09/2017   HCT 39.2 02/09/2017   PLT 280.0 02/09/2017   GLUCOSE 88 02/09/2017   CHOL 183 02/09/2017   TRIG 103.0 02/09/2017   HDL 43.40 02/09/2017   LDLDIRECT 206.8 06/25/2010   LDLCALC 119 (H) 02/09/2017   ALT 10 02/09/2017   AST 13 02/09/2017   NA 137 02/09/2017   K 3.8 02/09/2017   CL 106 02/09/2017   CREATININE 0.70 02/09/2017   BUN 7 02/09/2017   CO2 25 02/09/2017   TSH 1.37 02/09/2017      Assessment & Plan:

## 2017-05-17 NOTE — Assessment & Plan Note (Signed)
Exam benign, doubt cardiac related, most likley due to bicep insertion site strain with activity with holding the phone in a certain way at work

## 2017-05-17 NOTE — Assessment & Plan Note (Signed)
Will need to d/c the ACE due to the angioedema, also to increase the amlodipine 10 qd, f/u BP at home and next visit

## 2017-07-22 ENCOUNTER — Ambulatory Visit (INDEPENDENT_AMBULATORY_CARE_PROVIDER_SITE_OTHER): Payer: 59 | Admitting: Family Medicine

## 2017-07-22 ENCOUNTER — Encounter: Payer: Self-pay | Admitting: Family Medicine

## 2017-07-22 VITALS — BP 118/80 | HR 86 | Temp 99.1°F | Ht 62.0 in | Wt 164.0 lb

## 2017-07-22 DIAGNOSIS — M19079 Primary osteoarthritis, unspecified ankle and foot: Secondary | ICD-10-CM | POA: Diagnosis not present

## 2017-07-22 MED ORDER — DICLOFENAC SODIUM 2 % TD SOLN: 1.0000 "application " | Freq: Two times a day (BID) | TRANSDERMAL | 2 refills | Status: DC

## 2017-07-22 NOTE — Patient Instructions (Addendum)
Thank you for coming in,   Please try to wear a stiffer soled shoe. Extra cushion over that joint will be helpful as well. Please follow-up and we can perform an injection at some other time. I will be sending in Pennsaid which is a topical anti-inflammatory that he can roll over this area.   Please feel free to call with any questions or concerns at any time, at [phone]. --Dr. Raeford Razor

## 2017-07-22 NOTE — Progress Notes (Signed)
Sandy Hanna - 51 y.o. female MRN [id number]  Date of birth: Aug 17, 1966  SUBJECTIVE:  Including CC & ROS.  Chief Complaint  Patient presents with  . Foot Swelling    patient states pain in right big toe and the pain used to come and go and now it stays     Ms. Sandy Hanna is a 51 yo F that is presenting with left great toe pain. The pain has been getting worse and has become constant. Denies any injury. The pain is occurring on the dorsal and medial compartment of the great toe. The pain is worse after jogging or walking. No history of gout. Pain has been occurring for over 2 months.   Saw Dr. Jenny Reichmann on 05/17/17 for similar pain. She was given steroids with limited improvement of her pain. An xray of her left foot from 12/07/99 was normal.      Review of Systems  Musculoskeletal: Positive for gait problem and joint swelling.  Skin: Negative for color change.  Neurological: Negative for weakness and numbness.  Hematological: Negative for adenopathy.  otherwise negative   HISTORY: Past Medical, Surgical, Social, and Family History Reviewed & Updated per EMR.   Pertinent Historical Findings include:  Past Medical History:  Diagnosis Date  . Acute sinusitis, unspecified 12/30/2007  . ALLERGIC RHINITIS 12/15/2007  . ANEMIA-NOS 12/18/2010  . ANXIETY 12/15/2007  . BLEPHARITIS, RIGHT 04/03/2008  . CHEST PAIN 08/25/2010  . DEPRESSION 12/15/2007  . FATIGUE 08/25/2010  . GERD 12/15/2007  . HYPERLIPIDEMIA 07/03/2010  . HYPERSOMNIA 12/18/2010  . HYPERTENSION 06/27/2007  . Irritable bowel syndrome 04/28/2009  . ISCHEMIC COLITIS 04/28/2009  . Migraines 12/23/2008  . OTITIS MEDIA, BILATERAL 09/12/2008  . RECTAL BLEEDING 04/28/2009  . Vitamin D deficiency 03/23/2016    Past Surgical History:  Procedure Laterality Date  . BREAST BIOPSY  2005   Negative    Allergies  Allergen Reactions  . Amoxicillin-Pot Clavulanate     REACTION: nausea  . Clarithromycin     REACTION: Nausea  . Doxycycline     REACTION:  severe fatigue per pt  . Lisinopril Other (See Comments)    Angioedema left upper and lower lids    Family History  Problem Relation Age of Onset  . Breast cancer Mother   . Hypertension Mother   . Diabetes Mother   . Diabetes Paternal Grandmother   . Ovarian cancer Unknown        Aunt     Social History   Social History  . Marital status: Single    Spouse name: N/A  . Number of children: 1  . Years of education: N/A   Occupational History  . [employer]    [employer]   Social History Main Topics  . Smoking status: Current Every Day Smoker    Packs/day: 0.30    Types: Cigarettes  . Smokeless tobacco: Never Used     Comment: 6 cigarettes a day  . Alcohol use No  . Drug use: No  . Sexual activity: Not on file   Other Topics Concern  . Not on file   Social History Narrative   Patient does not get regular exercise   Daily Caffeine- use 2 cups daily     PHYSICAL EXAM:  VS: BP 118/80 (BP Location: Left Arm, Patient Position: Sitting, Cuff Size: Normal)   Pulse 86   Temp 99.1 F (37.3 C) (Oral)   Ht 5\' 2"  (1.575 m)   Wt 164 lb (74.4 kg)  SpO2 100%   BMI 30.00 kg/m  Physical Exam Gen: NAD, alert, cooperative with exam, well-appearing ENT: normal lips, normal nasal mucosa,  Eye: normal EOM, normal conjunctiva and lids CV:  no edema, +2 pedal pulses   Resp: no accessory muscle use, non-labored,  GI: no masses or tenderness, no hernia  Skin: no rashes, no areas of induration  Neuro: normal tone, normal sensation to touch Psych:  normal insight, alert and oriented MSK:  Left foot/great toe:  Mild swelling of the medial dorsal great toe and midfoot Hypertrophy of 1st the MTP joint  Normal arch Pain with great toe flexoin and extension  No redness  No abnormal callus or ulcer on plantar aspect Neurovascularly intact   Limited ultrasound: left great toe:  Significant bone spur sticking into the 1st MTP joint   Summary: OA of the 1st MTP  joint   Ultrasound and interpretation by Clearance Coots, MD          ASSESSMENT & PLAN:   Arthritis of metatarsophalangeal (MTP) joint of great toe Significant bone spur that protrudes into the joint with extension of the great toe.  - pennsaid provided  - advised a stiffer soled shoe or stiff orthotics  - can also try to cushion under this joint  - will follow up for injection  - if pain isn't controled with injection she may need surgery to remove spur.

## 2017-07-24 DIAGNOSIS — M19079 Primary osteoarthritis, unspecified ankle and foot: Secondary | ICD-10-CM | POA: Insufficient documentation

## 2017-07-24 NOTE — Assessment & Plan Note (Addendum)
Significant bone spur that protrudes into the joint with extension of the great toe.  - pennsaid provided  - advised a stiffer soled shoe or stiff orthotics  - can also try to cushion under this joint  - will follow up for injection  - if pain isn't controled with injection she may need surgery to remove spur.

## 2017-08-02 ENCOUNTER — Ambulatory Visit: Payer: 59 | Admitting: Family Medicine

## 2017-08-10 ENCOUNTER — Encounter: Payer: Self-pay | Admitting: Internal Medicine

## 2017-08-10 ENCOUNTER — Ambulatory Visit (INDEPENDENT_AMBULATORY_CARE_PROVIDER_SITE_OTHER): Payer: 59 | Admitting: Internal Medicine

## 2017-08-10 VITALS — BP 126/84 | HR 91 | Temp 98.2°F | Ht 62.0 in | Wt 165.0 lb

## 2017-08-10 DIAGNOSIS — I10 Essential (primary) hypertension: Secondary | ICD-10-CM

## 2017-08-10 DIAGNOSIS — J309 Allergic rhinitis, unspecified: Secondary | ICD-10-CM | POA: Diagnosis not present

## 2017-08-10 DIAGNOSIS — J019 Acute sinusitis, unspecified: Secondary | ICD-10-CM

## 2017-08-10 MED ORDER — IBUPROFEN 800 MG PO TABS: 800.0000 mg | ORAL_TABLET | Freq: Three times a day (TID) | ORAL | 0 refills | Status: DC | PRN

## 2017-08-10 MED ORDER — SULFAMETHOXAZOLE-TRIMETHOPRIM 800-160 MG PO TABS: 1.0000 | ORAL_TABLET | Freq: Two times a day (BID) | ORAL | 0 refills | Status: DC

## 2017-08-10 MED ORDER — PREDNISONE 10 MG PO TABS: ORAL_TABLET | ORAL | 0 refills | Status: DC

## 2017-08-10 NOTE — Assessment & Plan Note (Signed)
stable overall by history and exam, recent data reviewed with pt, and pt to continue medical treatment as before,  to f/u any worsening symptoms or concerns BP Readings from Last 3 Encounters:  08/10/17 126/84  07/22/17 118/80  05/17/17 130/82

## 2017-08-10 NOTE — Progress Notes (Signed)
Subjective:    Patient ID: Sandy Hanna, female    DOB: [birth year], 51 y.o.   MRN: [id number]  HPI  Here with 2-3 days acute onset fever, severe left > right facial pain, pressure, headache, general weakness and malaise, and greenish d/c, with mild ST and cough, but pt denies chest pain, wheezing, increased sob or doe, orthopnea, PND, increased LE swelling, palpitations, dizziness or syncope.  Does have several wks ongoing nasal allergy symptoms with clearish congestion, itch and sneezing, without fever, pain, ST, cough, swelling or wheezing  Pt denies new neurological symptoms such as new headache, or facial or extremity weakness or numbness  No other interval hx Past Medical History:  Diagnosis Date  . Acute sinusitis, unspecified 12/30/2007  . ALLERGIC RHINITIS 12/15/2007  . ANEMIA-NOS 12/18/2010  . ANXIETY 12/15/2007  . BLEPHARITIS, RIGHT 04/03/2008  . CHEST PAIN 08/25/2010  . DEPRESSION 12/15/2007  . FATIGUE 08/25/2010  . GERD 12/15/2007  . HYPERLIPIDEMIA 07/03/2010  . HYPERSOMNIA 12/18/2010  . HYPERTENSION 06/27/2007  . Irritable bowel syndrome 04/28/2009  . ISCHEMIC COLITIS 04/28/2009  . Migraines 12/23/2008  . OTITIS MEDIA, BILATERAL 09/12/2008  . RECTAL BLEEDING 04/28/2009  . Vitamin D deficiency 03/23/2016   Past Surgical History:  Procedure Laterality Date  . BREAST BIOPSY  2005   Negative    reports that she has been smoking Cigarettes.  She has been smoking about 0.30 packs per day. She has never used smokeless tobacco. She reports that she does not drink alcohol or use drugs. family history includes Breast cancer in her mother; Diabetes in her mother and paternal grandmother; Hypertension in her mother; Ovarian cancer in her unknown relative. Allergies  Allergen Reactions  . Amoxicillin-Pot Clavulanate     REACTION: nausea  . Clarithromycin     REACTION: Nausea  . Doxycycline     REACTION: severe fatigue per pt  . Lisinopril Other (See Comments)    Angioedema left upper and  lower lids   Current Outpatient Prescriptions on File Prior to Visit  Medication Sig Dispense Refill  . amLODipine (NORVASC) 10 MG tablet Take 1 tablet (10 mg total) by mouth daily. 90 tablet 3  . aspirin 81 MG EC tablet Take 1 tablet (81 mg total) by mouth daily. 30 tablet 5  . atorvastatin (LIPITOR) 20 MG tablet Take 1 tablet (20 mg total) by mouth daily. 90 tablet 3  . Cholecalciferol (VITAMIN D3) 50000 units TABS Take 1 tablet by mouth once a week. 12 tablet 0  . clotrimazole-betamethasone (LOTRISONE) cream Use as directed twice per day as needed 15 g 1  . cyclobenzaprine (FLEXERIL) 5 MG tablet Take 1 tablet (5 mg total) by mouth 3 (three) times daily as needed for muscle spasms. 40 tablet 1  . Diclofenac Sodium (PENNSAID) 2 % SOLN Place 1 application onto the skin 2 (two) times daily. 1 Bottle 2  . erythromycin ophthalmic ointment Place 1 application into both eyes 4 (four) times daily. 3.5 g 1  . pantoprazole (PROTONIX) 40 MG tablet Take 1 tablet (40 mg total) by mouth daily. 90 tablet 3  . traMADol (ULTRAM) 50 MG tablet Take 1 tablet (50 mg total) by mouth every 8 (eight) hours as needed. 60 tablet 0   No current facility-administered medications on file prior to visit.    Review of Systems  Constitutional: Negative for other unusual diaphoresis or sweats HENT: Negative for ear discharge or swelling Eyes: Negative for other worsening visual disturbances Respiratory: Negative for stridor or other  swelling  Gastrointestinal: Negative for worsening distension or other blood Genitourinary: Negative for retention or other urinary change Musculoskeletal: Negative for other MSK pain or swelling Skin: Negative for color change or other new lesions Neurological: Negative for worsening tremors and other numbness  Psychiatric/Behavioral: Negative for worsening agitation or other fatigue All other system neg per pt    Objective:   Physical Exam BP 126/84   Pulse 91   Temp 98.2 F (36.8  C) (Oral)   Ht 5\' 2"  (1.575 m)   Wt 165 lb (74.8 kg)   SpO2 99%   BMI 30.18 kg/m  VS noted, mild ill Constitutional: Pt appears in NAD HENT: Head: NCAT.  Right Ear: External ear normal.  Left Ear: External ear normal.  Eyes: . Pupils are equal, round, and reactive to light. Conjunctivae and EOM are normal Bilat tm's with mild erythema.  Max sinus areas mod to severe tender left maxillary.  Pharynx with mild erythema, no exudate Nose: without d/c or deformity Neck: Neck supple. Gross normal ROM Cardiovascular: Normal rate and regular rhythm.   Pulmonary/Chest: Effort normal and breath sounds without rales or wheezing.  Neurological: Pt is alert. At baseline orientation, motor grossly intact Skin: Skin is warm. No rashes, other new lesions, no LE edema Psychiatric: Pt behavior is normal without agitation  No other exam findings    Assessment & Plan:

## 2017-08-10 NOTE — Assessment & Plan Note (Signed)
Mild to mod seasonal flare, for predpac asd,  to f/u any worsening symptoms or concerns

## 2017-08-10 NOTE — Assessment & Plan Note (Signed)
Mild to mod, for antibx course,  to f/u any worsening symptoms or concerns 

## 2017-08-10 NOTE — Patient Instructions (Signed)
Please take all new medication as prescribed - the antibiotic, ibuprofen for pain, and prednisone  Please continue all other medications as before, and refills have been done if requested.  Please have the pharmacy call with any other refills you may need.  Please keep your appointments with your specialists as you may have planned

## 2017-08-11 ENCOUNTER — Ambulatory Visit: Payer: 59 | Admitting: Internal Medicine

## 2017-08-12 ENCOUNTER — Telehealth: Payer: Self-pay | Admitting: Internal Medicine

## 2017-08-12 MED ORDER — AZITHROMYCIN 250 MG PO TABS: ORAL_TABLET | ORAL | 1 refills | Status: DC

## 2017-08-12 NOTE — Telephone Encounter (Signed)
Ok to change to zpack - done erx

## 2017-08-12 NOTE — Telephone Encounter (Signed)
Pt called in said that the antibiotics called in for her yesterday is to strong.  She said that she is dizzy and feels sick on her stomach.  She would like to know if there is something else that can be called in?

## 2017-08-15 MED ORDER — LEVOFLOXACIN 500 MG PO TABS: 500.0000 mg | ORAL_TABLET | Freq: Every day | ORAL | 0 refills | Status: AC

## 2017-08-15 NOTE — Addendum Note (Signed)
Addended by: Biagio Borg on: 08/15/2017 02:58 PM   Modules accepted: Orders

## 2017-08-15 NOTE — Telephone Encounter (Signed)
Pt called stating the Zpak is the antibiotic she had before that was not working, she would like another antibiotic called in  Please advise she was informed of JJ being out today She also does not want anything that has a side affect of diarrhea because she has IBS.  Please advise

## 2017-08-15 NOTE — Telephone Encounter (Signed)
[place] for change to levaquin - done erx

## 2017-08-15 NOTE — Telephone Encounter (Signed)
Called pt, tried to leave a VM but mailbox is full and can not receive any additional msgs.

## 2017-08-15 NOTE — Telephone Encounter (Signed)
Called  pt no answer LMOM MD sent new rx for pharmacy...Sandy Hanna

## 2017-08-16 MED ORDER — DICYCLOMINE HCL 10 MG PO CAPS: 10.0000 mg | ORAL_CAPSULE | Freq: Three times a day (TID) | ORAL | 0 refills | Status: DC | PRN

## 2017-08-16 NOTE — Telephone Encounter (Signed)
Pt decided not to take any of the antibiotic and she now needs something for her IBS.  She said that she can not stop going to the bathroom due to antibiotic

## 2017-08-16 NOTE — Addendum Note (Signed)
Addended by: Biagio Borg on: 08/16/2017 03:20 PM   Modules accepted: Orders

## 2017-08-16 NOTE — Telephone Encounter (Signed)
Ok for bentyl 10 mg tid prn - done erx  Please also try OTC probiotic of her choice

## 2017-08-16 NOTE — Telephone Encounter (Signed)
Called pt cell again could not leave vm

## 2017-08-16 NOTE — Telephone Encounter (Signed)
Pt called back. I gave her MD response. She expressed understanding and did not have any questions at this time.

## 2017-08-16 NOTE — Telephone Encounter (Signed)
Called pt, VM is still full, can not leave a msg.

## 2017-08-19 ENCOUNTER — Other Ambulatory Visit: Payer: Self-pay | Admitting: Internal Medicine

## 2017-08-19 MED ORDER — DICYCLOMINE HCL 20 MG PO TABS: 20.0000 mg | ORAL_TABLET | Freq: Three times a day (TID) | ORAL | 2 refills | Status: DC | PRN

## 2017-12-07 ENCOUNTER — Ambulatory Visit: Payer: 59 | Admitting: Internal Medicine

## 2017-12-13 ENCOUNTER — Ambulatory Visit: Payer: 59 | Admitting: Internal Medicine

## 2018-02-08 ENCOUNTER — Ambulatory Visit (INDEPENDENT_AMBULATORY_CARE_PROVIDER_SITE_OTHER): Payer: 59

## 2018-02-08 ENCOUNTER — Telehealth: Payer: Self-pay | Admitting: *Deleted

## 2018-02-08 ENCOUNTER — Emergency Department (HOSPITAL_COMMUNITY)
Admission: EM | Admit: 2018-02-08 | Discharge: 2018-02-08 | Discharge status: Against Medical Advice | Payer: 59 | Attending: Emergency Medicine | Admitting: Emergency Medicine

## 2018-02-08 ENCOUNTER — Ambulatory Visit: Payer: 59 | Admitting: Physician Assistant

## 2018-02-08 ENCOUNTER — Encounter (HOSPITAL_COMMUNITY): Payer: Self-pay | Admitting: Emergency Medicine

## 2018-02-08 ENCOUNTER — Encounter: Payer: Self-pay | Admitting: Physician Assistant

## 2018-02-08 VITALS — BP 120/82 | HR 75 | Temp 98.4°F | Ht 62.0 in | Wt 164.4 lb

## 2018-02-08 DIAGNOSIS — Z5321 Procedure and treatment not carried out due to patient leaving prior to being seen by health care provider: Secondary | ICD-10-CM | POA: Insufficient documentation

## 2018-02-08 DIAGNOSIS — R202 Paresthesia of skin: Secondary | ICD-10-CM | POA: Diagnosis not present

## 2018-02-08 DIAGNOSIS — R1031 Right lower quadrant pain: Secondary | ICD-10-CM

## 2018-02-08 LAB — CBC WITH DIFFERENTIAL/PLATELET
BASOS PCT: 1.3 % (ref 0.0–3.0)
Basophils Absolute: 0.1 10*3/uL (ref 0.0–0.1)
EOS ABS: 0.1 10*3/uL (ref 0.0–0.7)
Eosinophils Relative: 0.9 % (ref 0.0–5.0)
HCT: 40.9 % (ref 36.0–46.0)
HEMOGLOBIN: 13.4 g/dL (ref 12.0–15.0)
Lymphocytes Relative: 48.4 % — ABNORMAL HIGH (ref 12.0–46.0)
Lymphs Abs: 3.7 10*3/uL (ref 0.7–4.0)
MCHC: 32.8 g/dL (ref 30.0–36.0)
MCV: 85.7 fl (ref 78.0–100.0)
MONO ABS: 0.5 10*3/uL (ref 0.1–1.0)
Monocytes Relative: 6.9 % (ref 3.0–12.0)
Neutro Abs: 3.2 10*3/uL (ref 1.4–7.7)
Neutrophils Relative %: 42.5 % — ABNORMAL LOW (ref 43.0–77.0)
Platelets: 261 10*3/uL (ref 150.0–400.0)
RBC: 4.77 Mil/uL (ref 3.87–5.11)
RDW: 15.2 % (ref 11.5–15.5)
WBC: 7.5 10*3/uL (ref 4.0–10.5)

## 2018-02-08 LAB — POCT URINALYSIS DIPSTICK
Bilirubin, UA: NEGATIVE
Blood, UA: NEGATIVE
Glucose, UA: NEGATIVE
KETONES UA: NEGATIVE
LEUKOCYTES UA: NEGATIVE
NITRITE UA: NEGATIVE
PH UA: 6 (ref 5.0–8.0)
PROTEIN UA: NEGATIVE
Spec Grav, UA: 1.01 (ref 1.010–1.025)
UROBILINOGEN UA: 0.2 U/dL

## 2018-02-08 LAB — POCT URINE PREGNANCY: PREG TEST UR: NEGATIVE

## 2018-02-08 MED ORDER — METHYLPREDNISOLONE 4 MG PO TBPK: ORAL_TABLET | ORAL | 0 refills | Status: DC

## 2018-02-08 NOTE — Telephone Encounter (Signed)
Pt calling to let us know that she tried to move her bowels and her pain intensified from a 5 to 7/10. Asked pt if she moved her bowels pt said she thinks a tiny bit. Put pt on hold and discussed with Aldona Bar and she said due to pt's history needs to go to ED. Told pt Aldona Bar said due to your history of Ischemic Bowel and we can not tell by the x-ray we did if bowel is not working properly only that you are constipated. She would like you to go to the ED to be evaluated do to increase in pain. They will probably need to do a CT scan with contrast to see the bowel better. Pt verbalized understanding and agreed to go to ED.

## 2018-02-08 NOTE — Patient Instructions (Signed)
Take the medrol dose pack as prescribed.  If symptoms worsen please seek medical attention!!!  If you develop a rash in the area you are experiencing pain in, please call our office!

## 2018-02-08 NOTE — ED Notes (Signed)
Pt called from the lobby with no response x2 

## 2018-02-08 NOTE — Progress Notes (Signed)
Sandy Hanna is a 52 y.o. female here for a new problem.  I acted as a Education administrator for [employer], PA-C Anselmo Pickler, LPN  History of Present Illness:   Chief Complaint  Patient presents with  . Abdominal Pain    RLQ    Abdominal Pain  This is a new problem. Episode onset: Started on Friday  The onset quality is sudden. The problem occurs constantly. The problem has been gradually worsening. The pain is located in the RLQ (Pt said pain is on the surface of the area does not feel deep). The pain is at a severity of 7/10. The pain is moderate. The quality of the pain is aching, burning and sharp. Pain radiation: radiates down into hip and thigh area. Associated symptoms include belching, constipation, flatus and nausea. Pertinent negatives include no diarrhea, dysuria, fever, frequency, headaches, hematuria or vomiting. The pain is aggravated by movement (lying more uncomfortable than standing or sitting). The pain is relieved by nothing. Treatments tried: Ibuprofen 800 mg x 1, now taking 400 mg every 6 hours. Improvement on treatment: very mild relief. Her past medical history is significant for GERD and irritable bowel syndrome. There is no history of abdominal surgery, colon cancer, Crohn's disease, gallstones, pancreatitis or ulcerative colitis.   Ibuprofen is easing the pain just a little bit. Patient's last menstrual period was 02/02/2018. Does have history of low back pain and leg paresthesias, was worked up by Wilfred Lacy, NP with no abnormal findings on xray.  She does have a history of ischemic colitis that was found on colonoscopy that was performed 2/2 abdominal pain and hematochezia.   She has decreased appetite. Has not tried to eat anything this morning.  Past Medical History:  Diagnosis Date  . Acute sinusitis, unspecified 12/30/2007  . ALLERGIC RHINITIS 12/15/2007  . ANEMIA-NOS 12/18/2010  . ANXIETY 12/15/2007  . BLEPHARITIS, RIGHT 04/03/2008  . CHEST PAIN 08/25/2010  .  DEPRESSION 12/15/2007  . FATIGUE 08/25/2010  . GERD 12/15/2007  . HYPERLIPIDEMIA 07/03/2010  . HYPERSOMNIA 12/18/2010  . HYPERTENSION 06/27/2007  . Irritable bowel syndrome 04/28/2009  . ISCHEMIC COLITIS 04/28/2009  . Migraines 12/23/2008  . OTITIS MEDIA, BILATERAL 09/12/2008  . RECTAL BLEEDING 04/28/2009  . Vitamin D deficiency 03/23/2016     Social History   Socioeconomic History  . Marital status: Single    Spouse name: Not on file  . Number of children: 1  . Years of education: Not on file  . Highest education level: Not on file  Social Needs  . Financial resource strain: Not on file  . Food insecurity - worry: Not on file  . Food insecurity - inability: Not on file  . Transportation needs - medical: Not on file  . Transportation needs - non-medical: Not on file  Occupational History  . 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 Smoking status: Current Every Day Smoker    Packs/day: 0.30    Types: Cigarettes  . Smokeless tobacco: Never Used  . Tobacco comment: 6 cigarettes a day  Substance and Sexual Activity  . Alcohol use: No  . Drug use: No  . Sexual activity: Not on file  Other Topics Concern  . Not on file  Social History Narrative   Patient does not get regular exercise   Daily Caffeine- use 2 cups daily    Past Surgical History:  Procedure Laterality Date  . BREAST BIOPSY  2005  Negative    Family History  Problem Relation Age of Onset  . Breast cancer Mother   . Hypertension Mother   . Diabetes Mother   . Diabetes Paternal Grandmother   . Ovarian cancer Unknown        Aunt    Allergies  Allergen Reactions  . Amoxicillin-Pot Clavulanate     REACTION: nausea  . Clarithromycin     REACTION: Nausea  . Doxycycline     REACTION: severe fatigue per pt  . Lisinopril Other (See Comments)    Angioedema left upper and lower lids    Current Medications:   Current Outpatient Medications:  .  amLODipine  (NORVASC) 10 MG tablet, Take 1 tablet (10 mg total) by mouth daily., Disp: 90 tablet, Rfl: 3 .  aspirin 81 MG EC tablet, Take 1 tablet (81 mg total) by mouth daily., Disp: 30 tablet, Rfl: 5 .  atorvastatin (LIPITOR) 20 MG tablet, Take 1 tablet (20 mg total) by mouth daily., Disp: 90 tablet, Rfl: 3 .  Cholecalciferol (VITAMIN D3) 50000 units TABS, Take 1 tablet by mouth once a week., Disp: 12 tablet, Rfl: 0 .  clotrimazole-betamethasone (LOTRISONE) cream, Use as directed twice per day as needed, Disp: 15 g, Rfl: 1 .  cyclobenzaprine (FLEXERIL) 5 MG tablet, Take 1 tablet (5 mg total) by mouth 3 (three) times daily as needed for muscle spasms., Disp: 40 tablet, Rfl: 1 .  Diclofenac Sodium (PENNSAID) 2 % SOLN, Place 1 application onto the skin 2 (two) times daily., Disp: 1 Bottle, Rfl: 2 .  dicyclomine (BENTYL) 20 MG tablet, Take 1 tablet (20 mg total) by mouth 3 (three) times daily as needed for spasms., Disp: 90 tablet, Rfl: 2 .  erythromycin ophthalmic ointment, Place 1 application into both eyes 4 (four) times daily. (Patient taking differently: Place 1 application into both eyes as needed. ), Disp: 3.5 g, Rfl: 1 .  ibuprofen (ADVIL,MOTRIN) 800 MG tablet, Take 1 tablet (800 mg total) by mouth every 8 (eight) hours as needed., Disp: 40 tablet, Rfl: 0 .  pantoprazole (PROTONIX) 40 MG tablet, Take 1 tablet (40 mg total) by mouth daily., Disp: 90 tablet, Rfl: 3 .  methylPREDNISolone (MEDROL DOSEPAK) 4 MG TBPK tablet, 6-5-4-3-2-1 -- off, Disp: 21 tablet, Rfl: 0   Review of Systems:   Review of Systems  Constitutional: Negative for fever.  Gastrointestinal: Positive for abdominal pain, constipation, flatus and nausea. Negative for diarrhea and vomiting.  Genitourinary: Negative for dysuria, frequency and hematuria.  Neurological: Negative for headaches.    Vitals:   Vitals:   02/08/18 1127  BP: 120/82  Pulse: 75  Temp: 98.4 F (36.9 C)  TempSrc: Oral  SpO2: 97%  Weight: 164 lb 6.1 oz (74.6  kg)  Height: 5\' 2"  (1.575 m)     Body mass index is 30.07 kg/m.  Physical Exam:   Physical Exam  Constitutional: She appears well-developed. She is cooperative.  Non-toxic appearance. She does not have a sickly appearance. She does not appear ill. No distress.  Cardiovascular: Normal rate, regular rhythm, S1 normal, S2 normal, normal heart sounds and normal pulses.  No LE edema  Pulmonary/Chest: Effort normal and breath sounds normal.  Abdominal: Normal appearance and bowel sounds are normal. There is tenderness in the right lower quadrant and left lower quadrant. There is no rigidity, no rebound, no guarding and no CVA tenderness.  Musculoskeletal:  No reproducible tenderness with deep palpation to bilateral paraspinal muscles. No bony tenderness. No evidence of erythema,  rash or ecchymosis.   Pain elicited with light palpation of R hip area. No rashes or ecchymosis noted.   Neurological: She is alert. GCS eye subscore is 4. GCS verbal subscore is 5. GCS motor subscore is 6.  Reflex Scores:      Patellar reflexes are 2+ on the right side and 2+ on the left side. Normal sensation throughout bilateral lower extremities  Skin: Skin is warm, dry and intact.  Psychiatric: She has a normal mood and affect. Her speech is normal and behavior is normal.  Nursing note and vitals reviewed.  Results for orders placed or performed in visit on 02/08/18  POCT urinalysis dipstick  Result Value Ref Range   Color, UA Yellow    Clarity, UA Clear    Glucose, UA Negative    Bilirubin, UA Negative    Ketones, UA Negative    Spec Grav, UA 1.010 1.010 - 1.025   Blood, UA Negative    pH, UA 6.0 5.0 - 8.0   Protein, UA Negative    Urobilinogen, UA 0.2 0.2 or 1.0 E.U./dL   Nitrite, UA Negative    Leukocytes, UA Negative Negative   Appearance     Odor    POCT urine pregnancy  Result Value Ref Range   Preg Test, Ur Negative Negative   Abd xray pending  Assessment and Plan:    Cierria was seen  today for abdominal pain.  Diagnoses and all orders for this visit:  RLQ abdominal pain and Paresthesias Abdominal xray pending. Labs pending. Suspect possible neuropathic pain, possibly originating from lumbar region. No visible evidence of shingles on exam today. Provided prescription for medrol dose pack to help with inflammation. Follow-up if symptoms worsen or persist. Discussed return to clinic precautions or symptoms that would warrant ED visit -- including, but not limited to: severe abdominal pain, inability to keep POs down, worsening paresthesias. -     POCT urinalysis dipstick -     POCT urine pregnancy -     DG Abd 1 View; Future -     CBC with Differential/Platelet -     Comprehensive metabolic panel  Other orders -     methylPREDNISolone (MEDROL DOSEPAK) 4 MG TBPK tablet; 6-5-4-3-2-1 -- off    . Reviewed expectations re: course of current medical issues. . Discussed self-management of symptoms. . Outlined signs and symptoms indicating need for more acute intervention. . Patient verbalized understanding and all questions were answered. . See orders for this visit as documented in the electronic medical record. . Patient received an After-Visit Summary.  CMA or LPN served as scribe during this visit. History, Physical, and Plan performed by medical provider. Documentation and orders reviewed and attested to.  Inda Coke, PA-C

## 2018-02-08 NOTE — ED Triage Notes (Addendum)
Pt having right flank pain that getting worse since Friday. Reports has constipation so when seen by PCP earlier and told to take a new medication pt tried having BM and pain became worse. Pt reports she had blood work, urine and ultrasound done today at there office.

## 2018-02-08 NOTE — ED Notes (Signed)
Pt called from the lobby with no response 

## 2018-02-08 NOTE — ED Notes (Signed)
Pt called from the lobby with no response x3 

## 2018-02-09 LAB — COMPREHENSIVE METABOLIC PANEL
ALT: 10 U/L (ref 0–35)
AST: 12 U/L (ref 0–37)
Albumin: 4.7 g/dL (ref 3.5–5.2)
Alkaline Phosphatase: 122 U/L — ABNORMAL HIGH (ref 39–117)
BUN: 6 mg/dL (ref 6–23)
CHLORIDE: 103 meq/L (ref 96–112)
CO2: 27 mEq/L (ref 19–32)
CREATININE: 0.67 mg/dL (ref 0.40–1.20)
Calcium: 9.7 mg/dL (ref 8.4–10.5)
GFR: 118.67 mL/min (ref >60.00)
GLUCOSE: 89 mg/dL (ref 70–99)
Potassium: 4 mEq/L (ref 3.5–5.1)
SODIUM: 138 meq/L (ref 135–145)
Total Bilirubin: 0.2 mg/dL (ref 0.2–1.2)
Total Protein: 7.3 g/dL (ref 6.0–8.3)

## 2018-02-10 ENCOUNTER — Telehealth: Payer: Self-pay | Admitting: Physician Assistant

## 2018-02-10 ENCOUNTER — Ambulatory Visit: Payer: 59 | Admitting: Internal Medicine

## 2018-02-10 NOTE — Telephone Encounter (Signed)
Copied from [place] [phone]. Topic: General - Call Back - No Documentation >> Feb 10, 2018 10:13 AM Conception Chancy, NT wrote: Patient is calling and states she missed a call from the office. She states she seen Inda Coke 02/08/18 and thinks it is in regards to that. Please advise.

## 2018-02-14 NOTE — Telephone Encounter (Signed)
See result note.  

## 2018-03-21 ENCOUNTER — Ambulatory Visit: Payer: 59 | Admitting: Internal Medicine

## 2018-03-21 ENCOUNTER — Encounter: Payer: Self-pay | Admitting: Internal Medicine

## 2018-03-21 VITALS — BP 114/72 | HR 93 | Temp 99.0°F | Ht 62.0 in | Wt 164.0 lb

## 2018-03-21 DIAGNOSIS — I10 Essential (primary) hypertension: Secondary | ICD-10-CM | POA: Diagnosis not present

## 2018-03-21 DIAGNOSIS — R110 Nausea: Secondary | ICD-10-CM

## 2018-03-21 DIAGNOSIS — J069 Acute upper respiratory infection, unspecified: Secondary | ICD-10-CM

## 2018-03-21 MED ORDER — ONDANSETRON HCL 4 MG PO TABS: 4.0000 mg | ORAL_TABLET | Freq: Three times a day (TID) | ORAL | 0 refills | Status: DC | PRN

## 2018-03-21 MED ORDER — AZITHROMYCIN 250 MG PO TABS: ORAL_TABLET | ORAL | 1 refills | Status: DC

## 2018-03-21 NOTE — Patient Instructions (Signed)
Please take all new medication as prescribed - the antibiotic, and nausea medication if needed  You are given the work note today  Please continue all other medications as before, and refills have been done if requested.  Please have the pharmacy call with any other refills you may need.  Please keep your appointments with your specialists as you may have planned

## 2018-03-21 NOTE — Assessment & Plan Note (Signed)
stable overall by history and exam, recent data reviewed with pt, and pt to continue medical treatment as before,  to f/u any worsening symptoms or concerns BP Readings from Last 3 Encounters:  03/21/18 114/72  02/08/18 120/82  08/10/17 126/84

## 2018-03-21 NOTE — Assessment & Plan Note (Signed)
Without vomiting, for zofran prn,  to f/u any worsening symptoms or concerns

## 2018-03-21 NOTE — Progress Notes (Signed)
Subjective:    Patient ID: Sandy Hanna, female    DOB: 09-25-1966, 52 y.o.   MRN: [id number]  HPI   Here with 2-3 days acute onset fever, facial pain, nausea, pressure, headache, general weakness and malaise, and greenish d/c, with mild ST and cough, and diffuse back pains and myalgias  but pt denies chest pain, wheezing, increased sob or doe, orthopnea, PND, increased LE swelling, palpitations, dizziness or syncope.  Pt denies new neurological symptoms such as new facial or extremity weakness or numbness   Pt denies polydipsia, polyuria Past Medical History:  Diagnosis Date  . Acute sinusitis, unspecified 12/30/2007  . ALLERGIC RHINITIS 12/15/2007  . ANEMIA-NOS 12/18/2010  . ANXIETY 12/15/2007  . BLEPHARITIS, RIGHT 04/03/2008  . CHEST PAIN 08/25/2010  . DEPRESSION 12/15/2007  . FATIGUE 08/25/2010  . GERD 12/15/2007  . HYPERLIPIDEMIA 07/03/2010  . HYPERSOMNIA 12/18/2010  . HYPERTENSION 06/27/2007  . Irritable bowel syndrome 04/28/2009  . ISCHEMIC COLITIS 04/28/2009  . Migraines 12/23/2008  . OTITIS MEDIA, BILATERAL 09/12/2008  . RECTAL BLEEDING 04/28/2009  . Vitamin D deficiency 03/23/2016   Past Surgical History:  Procedure Laterality Date  . BREAST BIOPSY  2005   Negative    reports that she has been smoking cigarettes.  She has been smoking about 0.30 packs per day. She has never used smokeless tobacco. She reports that she does not drink alcohol or use drugs. family history includes Breast cancer in her mother; Diabetes in her mother and paternal grandmother; Hypertension in her mother; Ovarian cancer in her unknown relative. Allergies  Allergen Reactions  . Amoxicillin-Pot Clavulanate     REACTION: nausea  . Clarithromycin     REACTION: Nausea  . Doxycycline     REACTION: severe fatigue per pt  . Lisinopril Other (See Comments)    Angioedema left upper and lower lids   Current Outpatient Medications on File Prior to Visit  Medication Sig Dispense Refill  . amLODipine (NORVASC) 10 MG  tablet Take 1 tablet (10 mg total) by mouth daily. 90 tablet 3  . aspirin 81 MG EC tablet Take 1 tablet (81 mg total) by mouth daily. 30 tablet 5  . atorvastatin (LIPITOR) 20 MG tablet Take 1 tablet (20 mg total) by mouth daily. 90 tablet 3  . Cholecalciferol (VITAMIN D3) 50000 units TABS Take 1 tablet by mouth once a week. 12 tablet 0  . clotrimazole-betamethasone (LOTRISONE) cream Use as directed twice per day as needed 15 g 1  . cyclobenzaprine (FLEXERIL) 5 MG tablet Take 1 tablet (5 mg total) by mouth 3 (three) times daily as needed for muscle spasms. 40 tablet 1  . Diclofenac Sodium (PENNSAID) 2 % SOLN Place 1 application onto the skin 2 (two) times daily. 1 Bottle 2  . dicyclomine (BENTYL) 20 MG tablet Take 1 tablet (20 mg total) by mouth 3 (three) times daily as needed for spasms. 90 tablet 2  . erythromycin ophthalmic ointment Place 1 application into both eyes 4 (four) times daily. (Patient taking differently: Place 1 application into both eyes as needed. ) 3.5 g 1  . ibuprofen (ADVIL,MOTRIN) 800 MG tablet Take 1 tablet (800 mg total) by mouth every 8 (eight) hours as needed. 40 tablet 0  . methylPREDNISolone (MEDROL DOSEPAK) 4 MG TBPK tablet 6-5-4-3-2-1 -- off 21 tablet 0  . pantoprazole (PROTONIX) 40 MG tablet Take 1 tablet (40 mg total) by mouth daily. 90 tablet 3   No current facility-administered medications on file prior to visit.  Review of Systems  Constitutional: Negative for other unusual diaphoresis or sweats HENT: Negative for ear discharge or swelling Eyes: Negative for other worsening visual disturbances Respiratory: Negative for stridor or other swelling  Gastrointestinal: Negative for worsening distension or other blood Genitourinary: Negative for retention or other urinary change Musculoskeletal: Negative for other MSK pain or swelling Skin: Negative for color change or other new lesions Neurological: Negative for worsening tremors and other numbness    Psychiatric/Behavioral: Negative for worsening agitation or other fatigue All other system neg per pt    Objective:   Physical Exam BP 114/72   Pulse 93   Temp 99 F (37.2 C) (Oral)   Ht 5\' 2"  (1.575 m)   Wt 164 lb (74.4 kg)   SpO2 97%   BMI 30.00 kg/m  VS noted, mild ill Constitutional: Pt appears in NAD HENT: Head: NCAT.  Right Ear: External ear normal.  Left Ear: External ear normal. Bilat tm's with mild erythema.  Max sinus areas mild tender.  Pharynx with mild erythema, no exudate  Eyes: . Pupils are equal, round, and reactive to light. Conjunctivae and EOM are normal Nose: without d/c or deformity Neck: Neck supple. Gross normal ROM Cardiovascular: Normal rate and regular rhythm.   Pulmonary/Chest: Effort normal and breath sounds without rales or wheezing.  Abd:  Soft, NT, ND, + BS, no organomegaly Neurological: Pt is alert. At baseline orientation, motor grossly intact Skin: Skin is warm. No rashes, other new lesions, no LE edema Psychiatric: Pt behavior is normal without agitation  No other exam findings     Assessment & Plan:

## 2018-03-21 NOTE — Assessment & Plan Note (Signed)
Mild to mod, for antibx course,  to f/u any worsening symptoms or concerns 

## 2018-03-27 ENCOUNTER — Encounter: Payer: 59 | Admitting: Internal Medicine

## 2018-03-30 ENCOUNTER — Encounter: Payer: Self-pay | Admitting: Internal Medicine

## 2018-03-30 ENCOUNTER — Other Ambulatory Visit (INDEPENDENT_AMBULATORY_CARE_PROVIDER_SITE_OTHER): Payer: 59

## 2018-03-30 ENCOUNTER — Ambulatory Visit (INDEPENDENT_AMBULATORY_CARE_PROVIDER_SITE_OTHER): Payer: 59 | Admitting: Internal Medicine

## 2018-03-30 VITALS — BP 122/78 | HR 97 | Temp 99.0°F | Ht 62.0 in | Wt 164.0 lb

## 2018-03-30 DIAGNOSIS — M109 Gout, unspecified: Secondary | ICD-10-CM

## 2018-03-30 DIAGNOSIS — Z0000 Encounter for general adult medical examination without abnormal findings: Secondary | ICD-10-CM

## 2018-03-30 DIAGNOSIS — Z114 Encounter for screening for human immunodeficiency virus [HIV]: Secondary | ICD-10-CM

## 2018-03-30 DIAGNOSIS — J069 Acute upper respiratory infection, unspecified: Secondary | ICD-10-CM | POA: Diagnosis not present

## 2018-03-30 LAB — URINALYSIS, ROUTINE W REFLEX MICROSCOPIC
BILIRUBIN URINE: NEGATIVE
KETONES UR: NEGATIVE
Leukocytes, UA: NEGATIVE
Nitrite: NEGATIVE
PH: 7.5 (ref 5.0–8.0)
SPECIFIC GRAVITY, URINE: 1.015 (ref 1.000–1.030)
Total Protein, Urine: NEGATIVE
UROBILINOGEN UA: 0.2 (ref 0.0–1.0)
Urine Glucose: NEGATIVE
WBC UA: NONE SEEN (ref 0–?)

## 2018-03-30 LAB — CBC WITH DIFFERENTIAL/PLATELET
BASOS ABS: 0.3 10*3/uL — AB (ref 0.0–0.1)
Basophils Relative: 3.5 % — ABNORMAL HIGH (ref 0.0–3.0)
EOS ABS: 0.1 10*3/uL (ref 0.0–0.7)
Eosinophils Relative: 1.1 % (ref 0.0–5.0)
HEMATOCRIT: 41.5 % (ref 36.0–46.0)
Hemoglobin: 13.9 g/dL (ref 12.0–15.0)
LYMPHS ABS: 3.2 10*3/uL (ref 0.7–4.0)
LYMPHS PCT: 38.4 % (ref 12.0–46.0)
MCHC: 33.4 g/dL (ref 30.0–36.0)
MCV: 85.5 fl (ref 78.0–100.0)
MONO ABS: 0.6 10*3/uL (ref 0.1–1.0)
Monocytes Relative: 7.2 % (ref 3.0–12.0)
NEUTROS ABS: 4.1 10*3/uL (ref 1.4–7.7)
NEUTROS PCT: 49.8 % (ref 43.0–77.0)
PLATELETS: 247 10*3/uL (ref 150.0–400.0)
RBC: 4.85 Mil/uL (ref 3.87–5.11)
RDW: 15.2 % (ref 11.5–15.5)
WBC: 8.3 10*3/uL (ref 4.0–10.5)

## 2018-03-30 LAB — LIPID PANEL
Cholesterol: 233 mg/dL — ABNORMAL HIGH (ref 0–200)
HDL: 52.1 mg/dL (ref >39.00)
LDL Cholesterol: 162 mg/dL — ABNORMAL HIGH (ref 0–99)
NONHDL: 181.02
TRIGLYCERIDES: 94 mg/dL (ref 0.0–149.0)
Total CHOL/HDL Ratio: 4
VLDL: 18.8 mg/dL (ref 0.0–40.0)

## 2018-03-30 LAB — BASIC METABOLIC PANEL
BUN: 7 mg/dL (ref 6–23)
CO2: 28 mEq/L (ref 19–32)
CREATININE: 0.7 mg/dL (ref 0.40–1.20)
Calcium: 9.6 mg/dL (ref 8.4–10.5)
Chloride: 104 mEq/L (ref 96–112)
GFR: 112.76 mL/min (ref >60.00)
Glucose, Bld: 90 mg/dL (ref 70–99)
POTASSIUM: 4.2 meq/L (ref 3.5–5.1)
Sodium: 139 mEq/L (ref 135–145)

## 2018-03-30 LAB — HEPATIC FUNCTION PANEL
ALT: 9 U/L (ref 0–35)
AST: 11 U/L (ref 0–37)
Albumin: 4.3 g/dL (ref 3.5–5.2)
Alkaline Phosphatase: 111 U/L (ref 39–117)
BILIRUBIN DIRECT: 0.1 mg/dL (ref 0.0–0.3)
TOTAL PROTEIN: 7.5 g/dL (ref 6.0–8.3)
Total Bilirubin: 0.4 mg/dL (ref 0.2–1.2)

## 2018-03-30 LAB — URIC ACID: Uric Acid, Serum: 4.1 mg/dL (ref 2.4–7.0)

## 2018-03-30 LAB — TSH: TSH: 1.32 u[IU]/mL (ref 0.35–4.50)

## 2018-03-30 MED ORDER — AMLODIPINE BESYLATE 10 MG PO TABS: 10.0000 mg | ORAL_TABLET | Freq: Every day | ORAL | 3 refills | Status: DC

## 2018-03-30 MED ORDER — DICYCLOMINE HCL 20 MG PO TABS: 20.0000 mg | ORAL_TABLET | Freq: Three times a day (TID) | ORAL | 3 refills | Status: AC | PRN

## 2018-03-30 MED ORDER — ERYTHROMYCIN 5 MG/GM OP OINT: 1.0000 "application " | TOPICAL_OINTMENT | Freq: Four times a day (QID) | OPHTHALMIC | 1 refills | Status: DC

## 2018-03-30 MED ORDER — ATORVASTATIN CALCIUM 20 MG PO TABS: 20.0000 mg | ORAL_TABLET | Freq: Every day | ORAL | 3 refills | Status: DC

## 2018-03-30 MED ORDER — AZITHROMYCIN 250 MG PO TABS: ORAL_TABLET | ORAL | 0 refills | Status: DC

## 2018-03-30 MED ORDER — PANTOPRAZOLE SODIUM 40 MG PO TBEC: 40.0000 mg | DELAYED_RELEASE_TABLET | Freq: Every day | ORAL | 3 refills | Status: DC

## 2018-03-30 NOTE — Progress Notes (Signed)
Subjective:    Patient ID: Sandy Hanna, female    DOB: 1966/10/02, 52 y.o.   MRN: [id number]  HPI  Here for wellness and f/u;  Overall doing ok;  Pt denies Chest pain, worsening SOB, DOE, wheezing, orthopnea, PND, worsening LE edema, palpitations, dizziness or syncope, though has some swelling to left foot and ankle intermittent  Was tx last yr with topical steroid, pt declined podiatry referral. .  Pt denies neurological change such as new headache, facial or extremity weakness.  Pt denies polydipsia, polyuria, or low sugar symptoms. Pt states overall good compliance with treatment and medications, good tolerability, and has been trying to follow appropriate diet.  Pt denies worsening depressive symptoms, suicidal ideation or panic. No fever, night sweats, wt loss, loss of appetite, or other constitutional symptoms.  Pt states good ability with ADL's, has low fall risk, home safety reviewed and adequate, no other significant changes in hearing or vision, and only occasionally active with exercise. Wt Readings from Last 3 Encounters:  03/30/18 164 lb (74.4 kg)  03/21/18 164 lb (74.4 kg)  02/08/18 164 lb 6.1 oz (74.6 kg)   BP Readings from Last 3 Encounters:  03/30/18 122/78  03/21/18 114/72  02/08/18 120/82  Does c/o bilat hip pain, midl to mod internmittent for 2-3 months, notices quite a bit after sitting for some time, standing can make feel better.  Also  Here with 2-3 days acute onset fever, facial pain, pressure, headache, general weakness and malaise, and greenish d/c, with mild ST and cough,  No other interval hx or new complaint Past Medical History:  Diagnosis Date  . Acute sinusitis, unspecified 12/30/2007  . ALLERGIC RHINITIS 12/15/2007  . ANEMIA-NOS 12/18/2010  . ANXIETY 12/15/2007  . BLEPHARITIS, RIGHT 04/03/2008  . CHEST PAIN 08/25/2010  . DEPRESSION 12/15/2007  . FATIGUE 08/25/2010  . GERD 12/15/2007  . HYPERLIPIDEMIA 07/03/2010  . HYPERSOMNIA 12/18/2010  . HYPERTENSION  06/27/2007  . Irritable bowel syndrome 04/28/2009  . ISCHEMIC COLITIS 04/28/2009  . Migraines 12/23/2008  . OTITIS MEDIA, BILATERAL 09/12/2008  . RECTAL BLEEDING 04/28/2009  . Vitamin D deficiency 03/23/2016   Past Surgical History:  Procedure Laterality Date  . BREAST BIOPSY  2005   Negative    reports that she has been smoking cigarettes.  She has been smoking about 0.30 packs per day. She has never used smokeless tobacco. She reports that she does not drink alcohol or use drugs. family history includes Breast cancer in her mother; Diabetes in her mother and paternal grandmother; Hypertension in her mother; Ovarian cancer in her unknown relative. Allergies  Allergen Reactions  . Amoxicillin-Pot Clavulanate     REACTION: nausea  . Clarithromycin     REACTION: Nausea  . Doxycycline     REACTION: severe fatigue per pt  . Lisinopril Other (See Comments)    Angioedema left upper and lower lids   Current Outpatient Medications on File Prior to Visit  Medication Sig Dispense Refill  . aspirin 81 MG EC tablet Take 1 tablet (81 mg total) by mouth daily. 30 tablet 5  . Cholecalciferol (VITAMIN D3) 50000 units TABS Take 1 tablet by mouth once a week. 12 tablet 0  . clotrimazole-betamethasone (LOTRISONE) cream Use as directed twice per day as needed 15 g 1  . Diclofenac Sodium (PENNSAID) 2 % SOLN Place 1 application onto the skin 2 (two) times daily. 1 Bottle 2  . ibuprofen (ADVIL,MOTRIN) 800 MG tablet Take 1 tablet (800 mg total) by mouth  every 8 (eight) hours as needed. 40 tablet 0   No current facility-administered medications on file prior to visit.     Review of Systems Constitutional: Negative for other unusual diaphoresis, sweats, appetite or weight changes HENT: Negative for other worsening hearing loss, ear pain, facial swelling, mouth sores or neck stiffness.   Eyes: Negative for other worsening pain, redness or other visual disturbance.  Respiratory: Negative for other stridor or  swelling Cardiovascular: Negative for other palpitations or other chest pain  Gastrointestinal: Negative for worsening diarrhea or loose stools, blood in stool, distention or other pain Genitourinary: Negative for hematuria, flank pain or other change in urine volume.  Musculoskeletal: Negative for myalgias or other joint swelling.  Skin: Negative for other color change, or other wound or worsening drainage.  Neurological: Negative for other syncope or numbness. Hematological: Negative for other adenopathy or swelling Psychiatric/Behavioral: Negative for hallucinations, other worsening agitation, SI, self-injury, or new decreased concentration All other system neg per pt    Objective:   Physical Exam BP 122/78   Pulse 97   Temp 99 F (37.2 C) (Oral)   Ht 5\' 2"  (1.575 m)   Wt 164 lb (74.4 kg)   SpO2 97%   BMI 30.00 kg/m  VS noted,  Constitutional: Pt is oriented to person, place, and time. Appears well-developed and well-nourished, in no significant distress and comfortable Bilat tm's with mild erythema.  Max sinus areas mild tender.  Pharynx with mild erythema, no exudate Head: Normocephalic and atraumatic  Eyes: Conjunctivae and EOM are normal. Pupils are equal, round, and reactive to light Right Ear: External ear normal without discharge Left Ear: External ear normal without discharge Nose: Nose without discharge or deformity Mouth/Throat: Oropharynx is without other ulcerations and moist  Neck: Normal range of motion. Neck supple. No JVD present. No tracheal deviation present or significant neck LA or mass Cardiovascular: Normal rate, regular rhythm, normal heart sounds and intact distal pulses.   Pulmonary/Chest: WOB normal and breath sounds without rales or wheezing  Abdominal: Soft. Bowel sounds are normal. NT. No HSM  Musculoskeletal: Normal range of motion. Exhibits no edema Lymphadenopathy: Has no other cervical adenopathy.  Neurological: Pt is alert and oriented to  person, place, and time. Pt has normal reflexes. No cranial nerve deficit. Motor grossly intact, Gait intact Skin: Skin is warm and dry. No rash noted or new ulcerations Psychiatric:  Has normal mood and affect. Behavior is normal without agitation No other exam findings    Assessment & Plan:

## 2018-03-30 NOTE — Patient Instructions (Addendum)
Please take all new medication as prescribed - the antibiotic  Please continue all other medications as before, and refills have been done if requested.  Please have the pharmacy call with any other refills you may need.  Please continue your efforts at being more active, low cholesterol diet, and weight control.  You are otherwise up to date with prevention measures today.  Please keep your appointments with your specialists as you may have planned  Please return in 1 year for your yearly visit, or sooner if needed, with Lab testing done 3-5 days before

## 2018-03-31 LAB — HIV ANTIBODY (ROUTINE TESTING W REFLEX): HIV 1&2 Ab, 4th Generation: NONREACTIVE

## 2018-04-01 ENCOUNTER — Ambulatory Visit: Payer: 59 | Admitting: Family Medicine

## 2018-04-01 ENCOUNTER — Encounter: Payer: Self-pay | Admitting: Family Medicine

## 2018-04-01 VITALS — BP 124/80 | HR 80 | Temp 98.1°F | Resp 16 | Wt 164.0 lb

## 2018-04-01 DIAGNOSIS — M7061 Trochanteric bursitis, right hip: Secondary | ICD-10-CM

## 2018-04-01 MED ORDER — DICLOFENAC SODIUM 75 MG PO TBEC: 75.0000 mg | DELAYED_RELEASE_TABLET | Freq: Two times a day (BID) | ORAL | 0 refills | Status: DC

## 2018-04-01 NOTE — Assessment & Plan Note (Signed)

## 2018-04-01 NOTE — Assessment & Plan Note (Signed)
Mild to mod, for antibx course,  to f/u any worsening symptoms or concerns 

## 2018-04-01 NOTE — Progress Notes (Signed)
Subjective  CC:  Chief Complaint  Patient presents with  . Hip Pain    Started yesterday, unable to cross legs or lay on that side. Sharp pain at a level 8 or 9 on pain scale.     HPI: Sandy Hanna is a 52 y.o. female who presents to the office today to address the problems listed above in the chief complaint.Evaluated today at the [facility].   As above, has right lateral hip pain that came on acutely. Painful with trying to lie on that side, cross her legs. No trauma. No change in activity levels. No back pain or B/B dysfuntion. Feels ok if sitting or walking. certain positions cause a "catching pain".    I reviewed the patients updated PMH, FH, and SocHx.    Patient Active Problem List   Diagnosis Date Noted  . Acute upper respiratory infection 03/21/2018  . Nausea 03/21/2018  . Arthritis of metatarsophalangeal (MTP) joint of great toe 07/24/2017  . Angio-edema 05/17/2017  . Left foot pain 05/17/2017  . Left arm pain 05/17/2017  . Acute gouty arthritis 03/24/2017  . Urinary frequency 02/09/2017  . Left lumbar radiculopathy 02/09/2017  . Fever 02/09/2017  . Low back pain 01/13/2017  . Bilateral carpal tunnel syndrome 01/13/2017  . Vitamin D deficiency 03/23/2016  . Anemia, iron deficiency 03/23/2016  . Acute sinus infection 08/21/2015  . Abdominal pain 12/20/2014  . Paresthesia 05/11/2012  . Rash 01/23/2012  . Constipation 08/10/2011  . Preventative health care 05/05/2011  . HYPERSOMNIA 12/18/2010  . FATIGUE 08/25/2010  . Hyperlipidemia 07/03/2010  . SHOULDER PAIN, RIGHT 05/28/2009  . ISCHEMIC COLITIS 04/28/2009  . Irritable bowel syndrome 04/28/2009  . RECTAL BLEEDING 04/28/2009  . COMMON MIGRAINE 12/23/2008  . ANXIETY 12/15/2007  . Depression 12/15/2007  . Allergic rhinitis 12/15/2007  . GERD 12/15/2007  . Essential hypertension 06/27/2007   Current Meds  Medication Sig  . amLODipine (NORVASC) 10 MG tablet Take 1 tablet (10 mg total) by  mouth daily.  Marland Kitchen aspirin 81 MG EC tablet Take 1 tablet (81 mg total) by mouth daily.  Marland Kitchen atorvastatin (LIPITOR) 20 MG tablet Take 1 tablet (20 mg total) by mouth daily.  . Cholecalciferol (VITAMIN D3) 50000 units TABS Take 1 tablet by mouth once a week.  . clotrimazole-betamethasone (LOTRISONE) cream Use as directed twice per day as needed  . Diclofenac Sodium (PENNSAID) 2 % SOLN Place 1 application onto the skin 2 (two) times daily.  Marland Kitchen dicyclomine (BENTYL) 20 MG tablet Take 1 tablet (20 mg total) by mouth 3 (three) times daily as needed for spasms.  Marland Kitchen erythromycin ophthalmic ointment Place 1 application into both eyes 4 (four) times daily.  Marland Kitchen ibuprofen (ADVIL,MOTRIN) 800 MG tablet Take 1 tablet (800 mg total) by mouth every 8 (eight) hours as needed.  . pantoprazole (PROTONIX) 40 MG tablet Take 1 tablet (40 mg total) by mouth daily.    Allergies: Patient is allergic to amoxicillin-pot clavulanate; clarithromycin; doxycycline; and lisinopril. Family History: Patient family history includes Breast cancer in her mother; Diabetes in her mother and paternal grandmother; Hypertension in her mother; Ovarian cancer in her unknown relative. Social History:  Patient  reports that she has been smoking cigarettes.  She has been smoking about 0.30 packs per day. She has never used smokeless tobacco. She reports that she does not drink alcohol or use drugs.  Review of Systems: Constitutional: Negative for fever malaise or anorexia Cardiovascular: negative for chest pain Respiratory: negative  for SOB or persistent cough Gastrointestinal: negative for abdominal pain  Objective  Vitals: BP 124/80   Pulse 80   Temp 98.1 F (36.7 C) (Oral)   Resp 16   Wt 164 lb (74.4 kg)   SpO2 98%   BMI 30.00 kg/m  General: no acute distress , A&Ox3, sitting comfortably and able to ambulate to table normally, nl gait Right hip: full ROM, tender over lateral gr troch bursa, reproduces pain;  Back: neg SLR  bilaterally, +2 knee reflexes Strength 5/5 quads Skin:  Warm, no rashes  Assessment  1. Greater trochanteric bursitis, right      Plan   Hip bursitis:  RICE with voltaren. See AVS. Then f/u 1-2 weeks if not improving.  Follow up: Return if symptoms worsen or fail to improve.    Commons side effects, risks, benefits, and alternatives for medications and treatment plan prescribed today were discussed, and the patient expressed understanding of the given instructions. Patient is instructed to call or message via MyChart if he/she has any questions or concerns regarding our treatment plan. No barriers to understanding were identified. We discussed Red Flag symptoms and signs in detail. Patient expressed understanding regarding what to do in case of urgent or emergency type symptoms.   Medication list was reconciled, printed and provided to the patient in AVS. Patient instructions and summary information was reviewed with the patient as documented in the AVS. This note was prepared with assistance of Dragon voice recognition software. Occasional wrong-word or sound-a-like substitutions may have occurred due to the inherent limitations of voice recognition software  No orders of the defined types were placed in this encounter.  Meds ordered this encounter  Medications  . diclofenac (VOLTAREN) 75 MG EC tablet    Sig: Take 1 tablet (75 mg total) by mouth 2 (two) times daily.    Dispense:  30 tablet    Refill:  0

## 2018-04-01 NOTE — Patient Instructions (Signed)
Please return in 2 weeks for recheck with pcp if not improved. Sooner if worsens.  Use the antiinflammatory medication twice a day with food and ice over the sore area 2-3x/day for the next several days.   If you have any questions or concerns, please don't hesitate to send me a message via MyChart or call the office at [phone]. Thank you for visiting with [place] today! It's our pleasure caring for you.  Trochanteric Bursitis Trochanteric bursitis is a condition that causes hip pain. Trochanteric bursitis happens when fluid-filled sacs (bursae) in the hip get irritated. Normally these sacs absorb shock and help strong bands of tissue (tendons) in your hip glide smoothly over each other and over your hip bones. What are the causes? This condition results from increased friction between the hip bones and the tendons that go over them. This condition can happen if you:  Have weak hips.  Use your hip muscles too much (overuse).  Get hit in the hip.  What increases the risk? This condition is more likely to develop in:  Women.  Adults who are middle-aged or older.  People with arthritis or a spinal condition.  People with weak buttocks muscles (gluteal muscles).  People who have one leg that is shorter than the other.  People who participate in certain kinds of athletic activities, such as: ? Running sports, especially long-distance running. ? Contact sports, like football or martial arts. ? Sports in which falls may occur, like skiing.  What are the signs or symptoms? The main symptom of this condition is pain and tenderness over the point of your hip. The pain may be:  Sharp and intense.  Dull and achy.  Felt on the outside of your thigh.  It may increase when you:  Lie on your side.  Walk or run.  Go up on stairs.  Sit.  Stand up after sitting.  Stand for long periods of time.  How is this diagnosed? This condition may be diagnosed based on:  Your  symptoms.  Your medical history.  A physical exam.  Imaging tests, such as: ? X-rays to check your bones. ? An MRI or ultrasound to check your tendons and muscles.  During your physical exam, your health care provider will check the movement and strength of your hip. He or she may press on the point of your hip to check for pain. How is this treated? This condition may be treated by:  Resting.  Reducing your activity.  Avoiding activities that cause pain.  Using crutches, a cane, or a walker to decrease the strain on your hip.  Taking medicine to help with swelling.  Having medicine injected into the bursae to help with swelling.  Using ice, heat, and massage therapy for pain relief.  Physical therapy exercises for strength and flexibility.  Surgery (rare).  Follow these instructions at home: Activity  Rest.  Avoid activities that cause pain.  Return to your normal activities as told by your health care provider. Ask your health care provider what activities are safe for you. Managing pain, stiffness, and swelling  Take over-the-counter and prescription medicines only as told by your health care provider.  If directed, apply heat to the injured area as told by your health care provider. ? Place a towel between your skin and the heat source. ? Leave the heat on for 20-30 minutes. ? Remove the heat if your skin turns bright red. This is especially important if you are unable to feel pain,  heat, or cold. You may have a greater risk of getting burned.  If directed, apply ice to the injured area: ? Put ice in a plastic bag. ? Place a towel between your skin and the bag. ? Leave the ice on for 20 minutes, 2-3 times a day. General instructions  If the affected leg is one that you use for driving, ask your health care provider when it is safe to drive.  Use crutches, a cane, or a walker as told by your health care provider.  If one of your legs is shorter than the  other, get fitted for a shoe insert.  Lose weight if you are overweight. How is this prevented?  Wear supportive footwear that is appropriate for your sport.  If you have hip pain, start any new exercise or sport slowly.  Maintain physical fitness, including: ? Strength. ? Flexibility. Contact a health care provider if:  Your pain does not improve with 2-4 weeks. Get help right away if:  You develop severe pain.  You have a fever.  You develop increased redness over your hip.  You have a change in your bowel function or bladder function.  You cannot control the muscles in your feet. This information is not intended to replace advice given to you by your health care provider. Make sure you discuss any questions you have with your health care provider. Document Released: 12/16/2004 Document Revised: 07/14/2016 Document Reviewed: 10/24/2015 Elsevier Interactive Patient Education  [employer].

## 2018-05-11 IMAGING — DX DG LUMBAR SPINE COMPLETE 4+V
5 series · 5 of 5 positions shown · non-contrast
Comparison: No recent prior.

CLINICAL DATA: Numbness and tingling.

EXAM:
LUMBAR SPINE - COMPLETE 4+ VIEW

[l-spine ap]
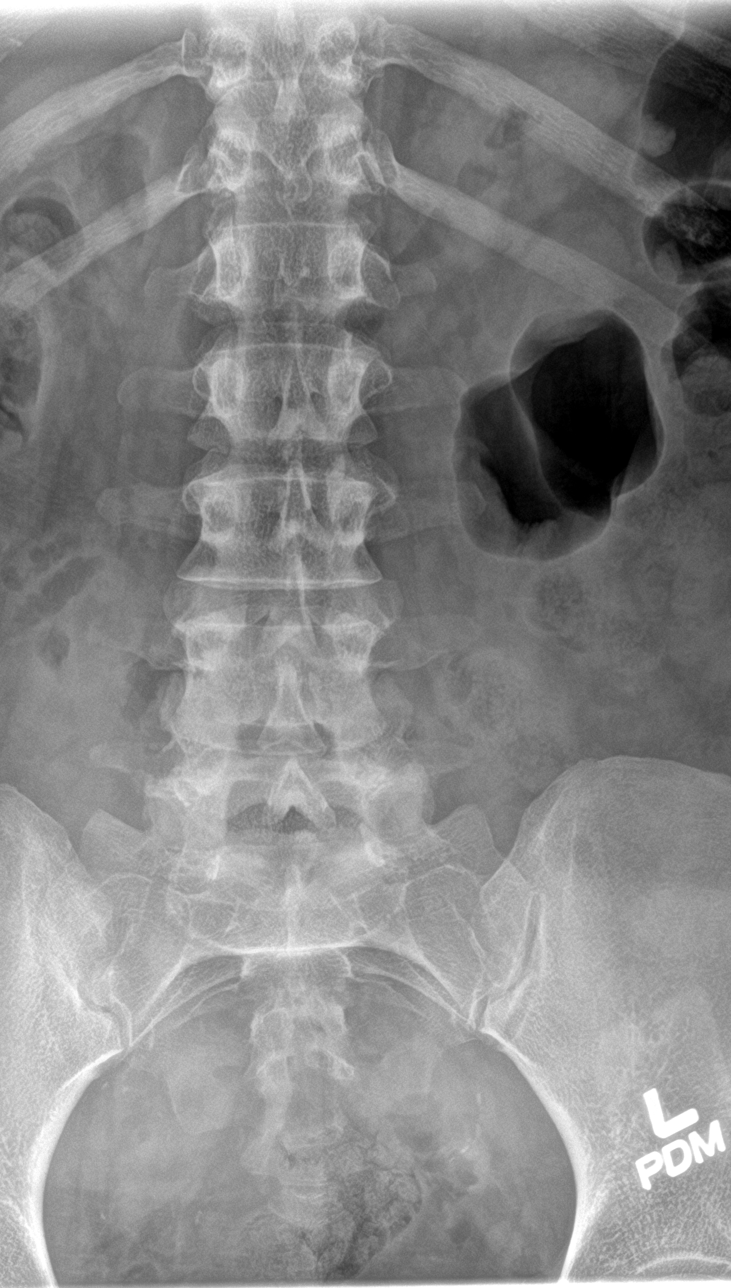

[l-spine obl (1 of 2)]
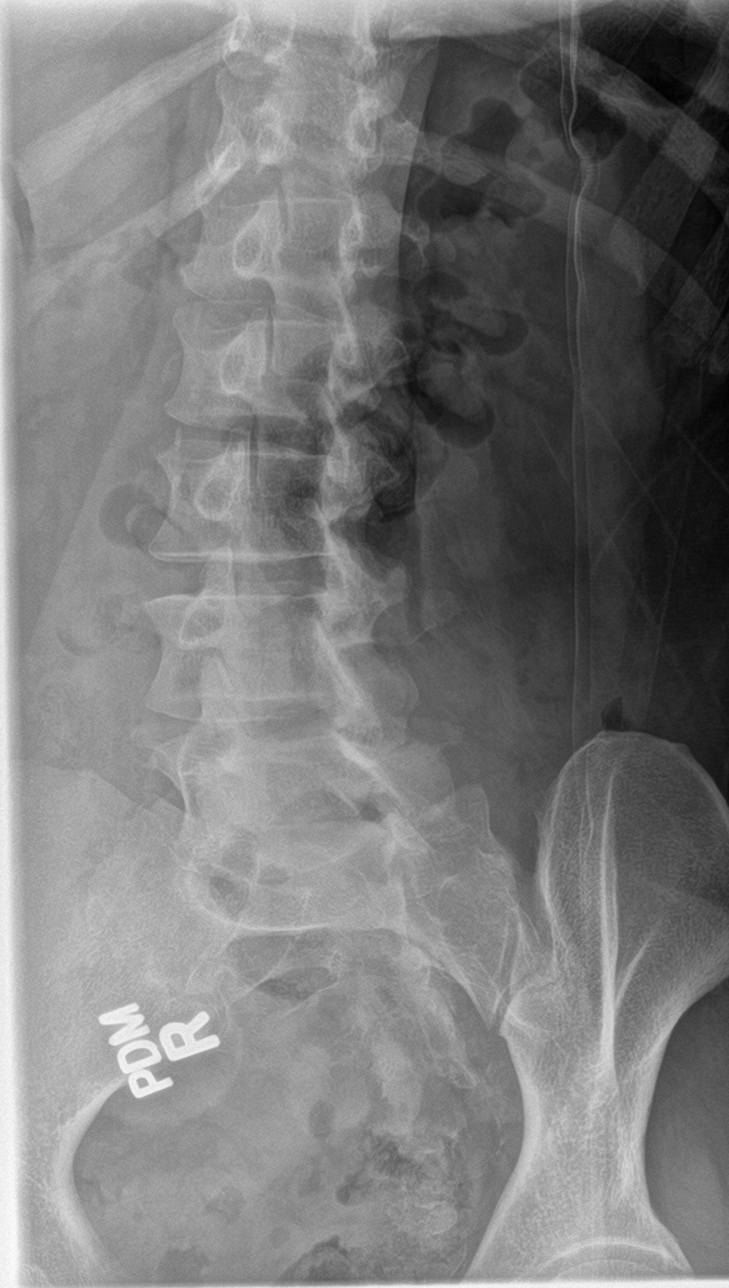

[l-spine obl (2 of 2)]
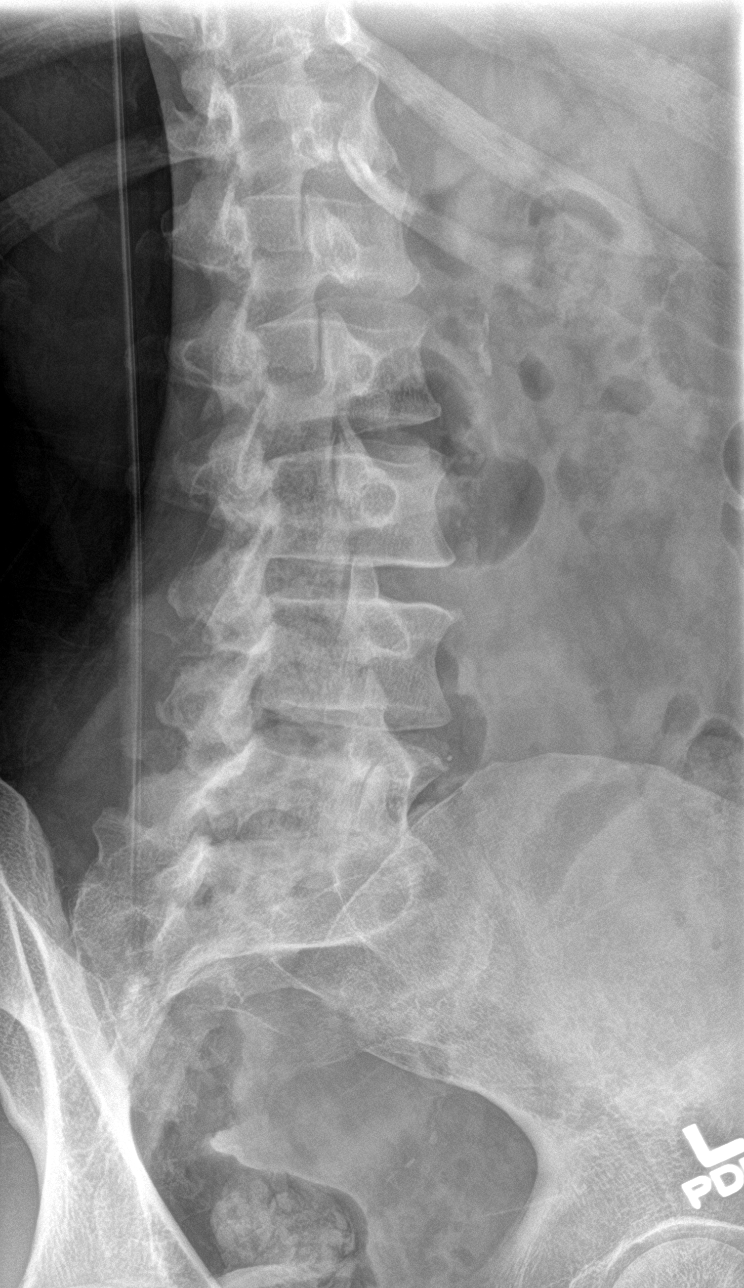

[l-spine lat]
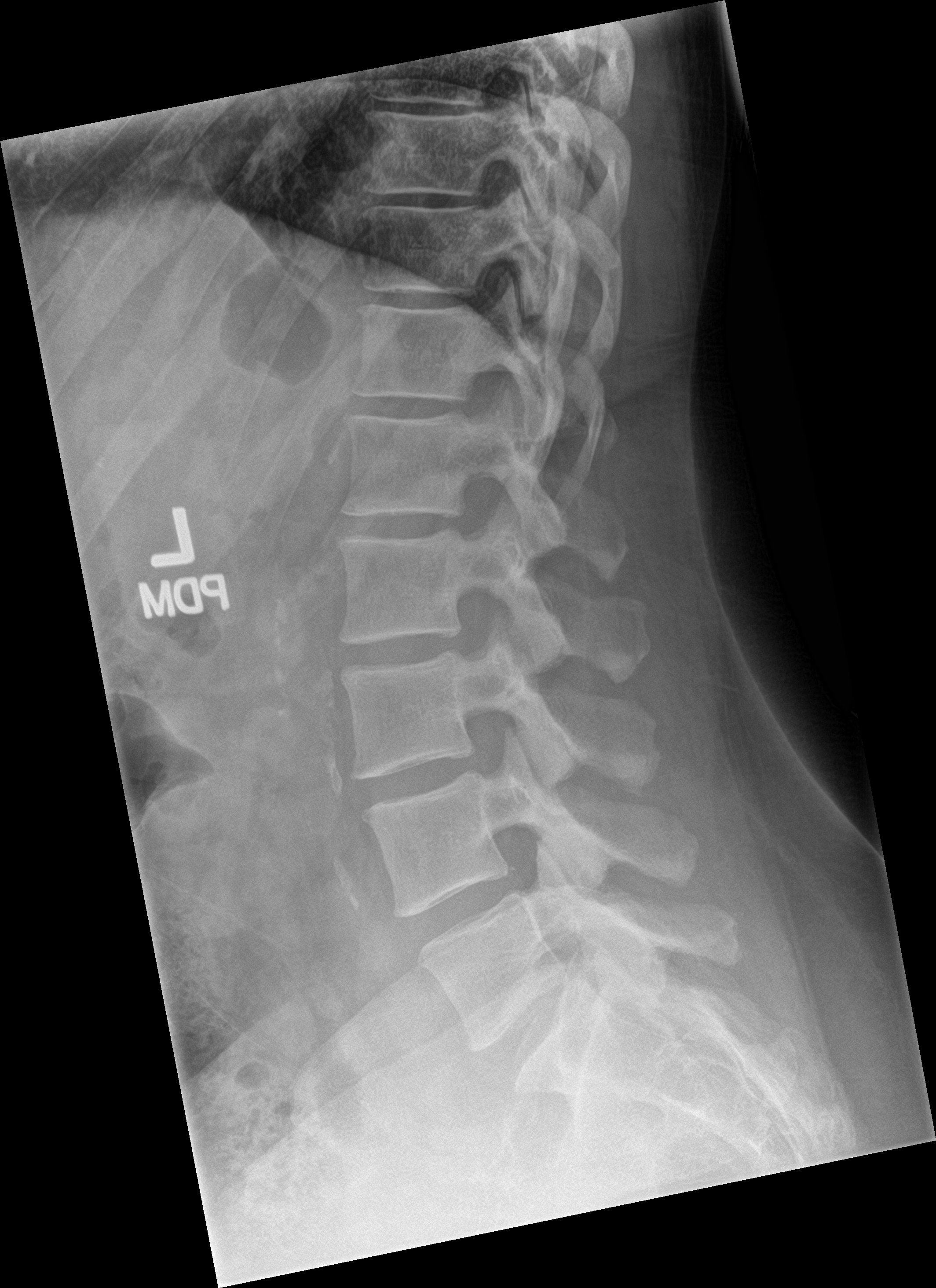

[l-spine spot]
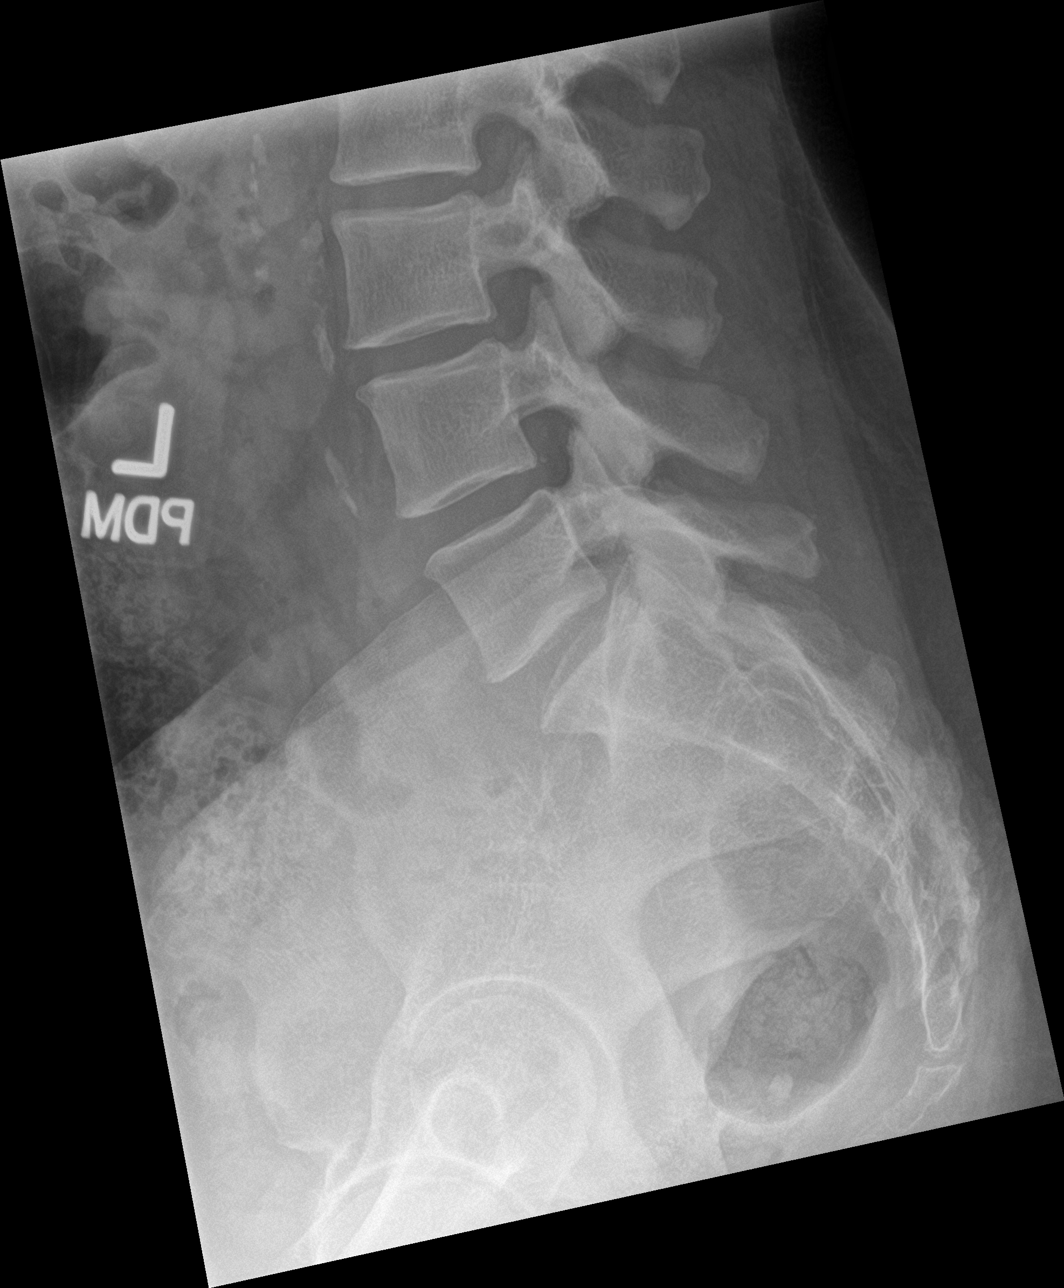

[5 of 5 positions shown; findings below may reference images not displayed]

FINDINGS: No acute bony abnormality identified. Normal alignment. Diffuse mild
degenerative change. Aortoiliac atherosclerotic vascular
calcification.
IMPRESSION: 1. No acute bony abnormality identified.

2. Aortoiliac atherosclerotic vascular disease.

## 2018-05-16 ENCOUNTER — Encounter: Payer: Self-pay | Admitting: Internal Medicine

## 2018-05-16 ENCOUNTER — Ambulatory Visit: Payer: 59 | Admitting: Internal Medicine

## 2018-05-16 DIAGNOSIS — L84 Corns and callosities: Secondary | ICD-10-CM | POA: Insufficient documentation

## 2018-05-16 DIAGNOSIS — F411 Generalized anxiety disorder: Secondary | ICD-10-CM | POA: Diagnosis not present

## 2018-05-16 DIAGNOSIS — I10 Essential (primary) hypertension: Secondary | ICD-10-CM | POA: Diagnosis not present

## 2018-05-16 NOTE — Assessment & Plan Note (Signed)
stable overall by history and exam, recent data reviewed with pt, and pt to continue medical treatment as before,  to f/u any worsening symptoms or concerns BP Readings from Last 3 Encounters:  05/16/18 124/76  04/01/18 124/80  03/30/18 122/78

## 2018-05-16 NOTE — Assessment & Plan Note (Signed)
stable overall by history and exam, and pt to continue medical treatment as before,  to f/u any worsening symptoms or concerns 

## 2018-05-16 NOTE — Patient Instructions (Signed)
Please wear a compression stocking during the day only with sitting to the left leg  Please continue all other medications as before, and refills have been done if requested.  Please have the pharmacy call with any other refills you may need.  Please keep your appointments with your specialists as you may have planned  You will be contacted regarding the referral for: podiatry

## 2018-05-16 NOTE — Assessment & Plan Note (Signed)
Ok for podiatry referral 

## 2018-05-16 NOTE — Progress Notes (Signed)
Subjective:    Patient ID: Sandy Hanna, female    DOB: 21-Sep-1966, 52 y.o.   MRN: [id number]  HPI  Here with c/o several months persistent pain to plantar proximal aspect of right great toe, mild to mod but worse to stand at work all day.  No trauma, red, swelling but has a hard area that hurts to touch.  Also c/o several months of recurring left ankle and foot swelling mild, resolved each am, but worse after standing every day at work.  Pt denies chest pain, increased sob or doe, wheezing, orthopnea, PND, increased LE swelling, palpitations, dizziness or syncope.  Denies worsening depressive symptoms, suicidal ideation, or panic Past Medical History:  Diagnosis Date  . Acute sinusitis, unspecified 12/30/2007  . ALLERGIC RHINITIS 12/15/2007  . ANEMIA-NOS 12/18/2010  . ANXIETY 12/15/2007  . BLEPHARITIS, RIGHT 04/03/2008  . CHEST PAIN 08/25/2010  . DEPRESSION 12/15/2007  . FATIGUE 08/25/2010  . GERD 12/15/2007  . HYPERLIPIDEMIA 07/03/2010  . HYPERSOMNIA 12/18/2010  . HYPERTENSION 06/27/2007  . Irritable bowel syndrome 04/28/2009  . ISCHEMIC COLITIS 04/28/2009  . Migraines 12/23/2008  . OTITIS MEDIA, BILATERAL 09/12/2008  . RECTAL BLEEDING 04/28/2009  . Vitamin D deficiency 03/23/2016   Past Surgical History:  Procedure Laterality Date  . BREAST BIOPSY  2005   Negative    reports that she has been smoking cigarettes.  She has been smoking about 0.30 packs per day. She has never used smokeless tobacco. She reports that she does not drink alcohol or use drugs. family history includes Breast cancer in her mother; Diabetes in her mother and paternal grandmother; Hypertension in her mother; Ovarian cancer in her unknown relative. Allergies  Allergen Reactions  . Amoxicillin-Pot Clavulanate     REACTION: nausea  . Clarithromycin     REACTION: Nausea  . Doxycycline     REACTION: severe fatigue per pt  . Lisinopril Other (See Comments)    Angioedema left upper and lower lids   Current Outpatient  Medications on File Prior to Visit  Medication Sig Dispense Refill  . amLODipine (NORVASC) 10 MG tablet Take 1 tablet (10 mg total) by mouth daily. 90 tablet 3  . aspirin 81 MG EC tablet Take 1 tablet (81 mg total) by mouth daily. 30 tablet 5  . atorvastatin (LIPITOR) 20 MG tablet Take 1 tablet (20 mg total) by mouth daily. 90 tablet 3  . Cholecalciferol (VITAMIN D3) 50000 units TABS Take 1 tablet by mouth once a week. 12 tablet 0  . clotrimazole-betamethasone (LOTRISONE) cream Use as directed twice per day as needed 15 g 1  . diclofenac (VOLTAREN) 75 MG EC tablet Take 1 tablet (75 mg total) by mouth 2 (two) times daily. 30 tablet 0  . Diclofenac Sodium (PENNSAID) 2 % SOLN Place 1 application onto the skin 2 (two) times daily. 1 Bottle 2  . dicyclomine (BENTYL) 20 MG tablet Take 1 tablet (20 mg total) by mouth 3 (three) times daily as needed for spasms. 90 tablet 3  . erythromycin ophthalmic ointment Place 1 application into both eyes 4 (four) times daily. 3.5 g 1  . ibuprofen (ADVIL,MOTRIN) 800 MG tablet Take 1 tablet (800 mg total) by mouth every 8 (eight) hours as needed. 40 tablet 0  . pantoprazole (PROTONIX) 40 MG tablet Take 1 tablet (40 mg total) by mouth daily. 90 tablet 3   No current facility-administered medications on file prior to visit.    Review of Systems  Constitutional: Negative for other unusual  diaphoresis or sweats HENT: Negative for ear discharge or swelling Eyes: Negative for other worsening visual disturbances Respiratory: Negative for stridor or other swelling  Gastrointestinal: Negative for worsening distension or other blood Genitourinary: Negative for retention or other urinary change Musculoskeletal: Negative for other MSK pain or swelling Skin: Negative for color change or other new lesions Neurological: Negative for worsening tremors and other numbness  Psychiatric/Behavioral: Negative for worsening agitation or other fatigue All other system neg per pt      Objective:   Physical Exam BP 124/76   Pulse 95   Temp 98.6 F (37 C) (Oral)   Ht 5\' 2"  (1.575 m)   Wt 167 lb (75.8 kg)   SpO2 95%   BMI 30.54 kg/m  VS noted,  Constitutional: Pt appears in NAD HENT: Head: NCAT.  Right Ear: External ear normal.  Left Ear: External ear normal.  Eyes: . Pupils are equal, round, and reactive to light. Conjunctivae and EOM are normal Nose: without d/c or deformity Neck: Neck supple. Gross normal ROM Cardiovascular: Normal rate and regular rhythm.   Pulmonary/Chest: Effort normal and breath sounds without rales or wheezing.  Neurological: Pt is alert. At baseline orientation, motor grossly intact Skin: Skin is warm. No rashes, other new lesions, trace LLE ankle edema, tender corn to right great toe plantar aspect Psychiatric: Pt behavior is normal without agitation , 1+ nervous No other exam findings    Assessment & Plan:

## 2018-06-27 ENCOUNTER — Encounter: Payer: Self-pay | Admitting: Internal Medicine

## 2018-06-27 ENCOUNTER — Ambulatory Visit: Payer: 59 | Admitting: Internal Medicine

## 2018-06-27 ENCOUNTER — Ambulatory Visit (INDEPENDENT_AMBULATORY_CARE_PROVIDER_SITE_OTHER)
Admission: RE | Admit: 2018-06-27 | Discharge: 2018-06-27 | Disposition: A | Discharge status: Home / Self Care | Payer: 59 | Source: Ambulatory Visit | Attending: Internal Medicine | Admitting: Internal Medicine

## 2018-06-27 VITALS — BP 122/80 | HR 88 | Temp 98.5°F | Ht 62.0 in | Wt 166.0 lb

## 2018-06-27 DIAGNOSIS — M549 Dorsalgia, unspecified: Secondary | ICD-10-CM | POA: Insufficient documentation

## 2018-06-27 DIAGNOSIS — M546 Pain in thoracic spine: Secondary | ICD-10-CM

## 2018-06-27 DIAGNOSIS — R079 Chest pain, unspecified: Secondary | ICD-10-CM | POA: Insufficient documentation

## 2018-06-27 DIAGNOSIS — E785 Hyperlipidemia, unspecified: Secondary | ICD-10-CM | POA: Diagnosis not present

## 2018-06-27 DIAGNOSIS — I10 Essential (primary) hypertension: Secondary | ICD-10-CM | POA: Diagnosis not present

## 2018-06-27 NOTE — Assessment & Plan Note (Signed)
stable overall by history and exam, recent data reviewed with pt, and pt to continue medical treatment as before,  to f/u any worsening symptoms or concerns BP Readings from Last 3 Encounters:  06/27/18 122/80  05/16/18 124/76  04/01/18 124/80

## 2018-06-27 NOTE — Assessment & Plan Note (Signed)
Exam benign, cant r/o FMS but also statin myalgias? For hold statin for 4 wks and consider restart vs change to crestor

## 2018-06-27 NOTE — Assessment & Plan Note (Addendum)
Exam benign, cant r/o FMS but also statin myalgias? For hold statin for 4 wks and consider restart vs change to crestor; also for cxr

## 2018-06-27 NOTE — Progress Notes (Signed)
Subjective:    Patient ID: Sandy Hanna, female    DOB: 09-Jun-1966, 52 y.o.   MRN: [id number]  HPI  Here to f/u - "I just dont feel good."  "somethings going on but I dont know what it is."  C/o several wks intermittent wax and wane issue with mild mid upper back pain, located between shoulder blades but not at same time, and feels kind of like band like sensation from the back around to the anterior lower chest area.  Also has mild intermittent nausea for several days last wk, but Denies worsening reflux, abd pain, dysphagia, vomiting, bowel change or blood.   Pt denies fever, wt loss, night sweats, loss of appetite, or other constitutional symptoms  Pt denies new neurological symptoms such as new headache, or facial or extremity weakness or numbness   Pt denies polydipsia, polyuria  Denies worsening depressive symptoms, suicidal ideation, or panic Past Medical History:  Diagnosis Date  . Acute sinusitis, unspecified 12/30/2007  . ALLERGIC RHINITIS 12/15/2007  . ANEMIA-NOS 12/18/2010  . ANXIETY 12/15/2007  . BLEPHARITIS, RIGHT 04/03/2008  . CHEST PAIN 08/25/2010  . DEPRESSION 12/15/2007  . FATIGUE 08/25/2010  . GERD 12/15/2007  . HYPERLIPIDEMIA 07/03/2010  . HYPERSOMNIA 12/18/2010  . HYPERTENSION 06/27/2007  . Irritable bowel syndrome 04/28/2009  . ISCHEMIC COLITIS 04/28/2009  . Migraines 12/23/2008  . OTITIS MEDIA, BILATERAL 09/12/2008  . RECTAL BLEEDING 04/28/2009  . Vitamin D deficiency 03/23/2016   Past Surgical History:  Procedure Laterality Date  . BREAST BIOPSY  2005   Negative    reports that she has been smoking cigarettes.  She has been smoking about 0.30 packs per day. She has never used smokeless tobacco. She reports that she does not drink alcohol or use drugs. family history includes Breast cancer in her mother; Diabetes in her mother and paternal grandmother; Hypertension in her mother; Ovarian cancer in her unknown relative. Allergies  Allergen Reactions  . Amoxicillin-Pot  Clavulanate     REACTION: nausea  . Clarithromycin     REACTION: Nausea  . Doxycycline     REACTION: severe fatigue per pt  . Lisinopril Other (See Comments)    Angioedema left upper and lower lids   Current Outpatient Medications on File Prior to Visit  Medication Sig Dispense Refill  . amLODipine (NORVASC) 10 MG tablet Take 1 tablet (10 mg total) by mouth daily. 90 tablet 3  . aspirin 81 MG EC tablet Take 1 tablet (81 mg total) by mouth daily. 30 tablet 5  . atorvastatin (LIPITOR) 20 MG tablet Take 1 tablet (20 mg total) by mouth daily. 90 tablet 3  . Cholecalciferol (VITAMIN D3) 50000 units TABS Take 1 tablet by mouth once a week. 12 tablet 0  . clotrimazole-betamethasone (LOTRISONE) cream Use as directed twice per day as needed 15 g 1  . diclofenac (VOLTAREN) 75 MG EC tablet Take 1 tablet (75 mg total) by mouth 2 (two) times daily. 30 tablet 0  . Diclofenac Sodium (PENNSAID) 2 % SOLN Place 1 application onto the skin 2 (two) times daily. 1 Bottle 2  . dicyclomine (BENTYL) 20 MG tablet Take 1 tablet (20 mg total) by mouth 3 (three) times daily as needed for spasms. 90 tablet 3  . erythromycin ophthalmic ointment Place 1 application into both eyes 4 (four) times daily. 3.5 g 1  . ibuprofen (ADVIL,MOTRIN) 800 MG tablet Take 1 tablet (800 mg total) by mouth every 8 (eight) hours as needed. 40 tablet 0  .  pantoprazole (PROTONIX) 40 MG tablet Take 1 tablet (40 mg total) by mouth daily. 90 tablet 3   No current facility-administered medications on file prior to visit.    Review of Systems  Constitutional: Negative for other unusual diaphoresis or sweats HENT: Negative for ear discharge or swelling Eyes: Negative for other worsening visual disturbances Respiratory: Negative for stridor or other swelling  Gastrointestinal: Negative for worsening distension or other blood Genitourinary: Negative for retention or other urinary change Musculoskeletal: Negative for other MSK pain or  swelling Skin: Negative for color change or other new lesions Neurological: Negative for worsening tremors and other numbness  Psychiatric/Behavioral: Negative for worsening agitation or other fatigue All other system neg per pt    Objective:   Physical Exam BP 122/80   Pulse 88   Temp 98.5 F (36.9 C) (Oral)   Ht 5\' 2"  (1.575 m)   Wt 166 lb (75.3 kg)   SpO2 97%   BMI 30.36 kg/m  VS noted, not ill appearing Constitutional: Pt appears in NAD HENT: Head: NCAT.  Right Ear: External ear normal.  Left Ear: External ear normal.  Eyes: . Pupils are equal, round, and reactive to light. Conjunctivae and EOM are normal Nose: without d/c or deformity Neck: Neck supple. Gross normal ROM Cardiovascular: Normal rate and regular rhythm.   Pulmonary/Chest: Effort normal and breath sounds without rales or wheezing.  Abd:  Soft, NT, ND, + BS, no organomegaly Neurological: Pt is alert. At baseline orientation, motor grossly intact Skin: Skin is warm. No rashes, other new lesions, no LE edema Psychiatric: Pt behavior is normal without agitation  No other exam findings Lab Results  Component Value Date   WBC 8.3 03/30/2018   HGB 13.9 03/30/2018   HCT 41.5 03/30/2018   PLT 247.0 03/30/2018   GLUCOSE 90 03/30/2018   CHOL 233 (H) 03/30/2018   TRIG 94.0 03/30/2018   HDL 52.10 03/30/2018   LDLDIRECT 206.8 06/25/2010   LDLCALC 162 (H) 03/30/2018   ALT 9 03/30/2018   AST 11 03/30/2018   NA 139 03/30/2018   K 4.2 03/30/2018   CL 104 03/30/2018   CREATININE 0.70 03/30/2018   BUN 7 03/30/2018   CO2 28 03/30/2018   TSH 1.32 03/30/2018      Assessment & Plan:

## 2018-06-27 NOTE — Patient Instructions (Signed)
OK to hold off on taking the lipitor for 4 wks  If symptoms are not improved, it would be ok to restart the lipitor  If symptoms are improved, we should consider change to different statin  Please continue all other medications as before, and refills have been done if requested.  Please have the pharmacy call with any other refills you may need.  Please keep your appointments with your specialists as you may have planned  Please go to the XRAY Department in the Basement (go straight as you get off the elevator) for the x-ray testing  You will be contacted by phone if any changes need to be made immediately.  Otherwise, you will receive a letter about your results with an explanation, but please check with MyChart first.  Please remember to sign up for MyChart if you have not done so, as this will be important to you in the future with finding out test results, communicating by private email, and scheduling acute appointments online when needed.

## 2018-06-27 NOTE — Assessment & Plan Note (Signed)
As above, for lower chol diet as well

## 2018-06-28 ENCOUNTER — Encounter: Payer: Self-pay | Admitting: Internal Medicine

## 2018-07-07 ENCOUNTER — Ambulatory Visit: Payer: Self-pay | Admitting: Podiatry

## 2018-07-09 DIAGNOSIS — B370 Candidal stomatitis: Secondary | ICD-10-CM | POA: Diagnosis not present

## 2018-07-09 DIAGNOSIS — R600 Localized edema: Secondary | ICD-10-CM | POA: Diagnosis not present

## 2018-07-12 ENCOUNTER — Telehealth: Payer: Self-pay | Admitting: Internal Medicine

## 2018-07-12 MED ORDER — CLOTRIMAZOLE 10 MG MT TROC: 10.0000 mg | Freq: Every day | OROMUCOSAL | 1 refills | Status: DC

## 2018-07-12 NOTE — Telephone Encounter (Signed)
Faxed [facility] to [facility] 07/12/18 fbg

## 2018-07-12 NOTE — Telephone Encounter (Signed)
[facility] for change to mycelex troche - done erx

## 2018-07-12 NOTE — Telephone Encounter (Signed)
Copied from [place] [id number]. Topic: Quick Communication - See Telephone Encounter >> Jul 12, 2018 11:43 AM Bea Graff, NT wrote: CRM for notification. See Telephone encounter for: 07/12/18. Pt states she went to the urgent care on Sunday for thrush in her mouth and they gave her Dukes Magic Mouthwash, and pt states that it is working but she feels she needs something stronger. If sent in today or tomorrow use [facility] [place].

## 2018-07-12 NOTE — Addendum Note (Signed)
Addended by: Biagio Borg on: 07/12/2018 01:13 PM   Modules accepted: Orders

## 2018-07-14 NOTE — Telephone Encounter (Signed)
Recv'd medical records from [facility] fowarded 7 pages to Dr. [name]

## 2018-07-17 ENCOUNTER — Telehealth: Payer: Self-pay

## 2018-07-17 MED ORDER — FLUCONAZOLE 150 MG PO TABS
ORAL_TABLET | ORAL | 1 refills | Status: DC
Start: 2018-07-17 — End: 2018-10-16

## 2018-07-17 NOTE — Telephone Encounter (Signed)
Done erx 

## 2018-07-17 NOTE — Addendum Note (Signed)
Addended by: Biagio Borg on: 07/17/2018 12:36 PM   Modules accepted: Orders

## 2018-07-17 NOTE — Telephone Encounter (Signed)
Copied from [place] [phone]. Topic: Quick Communication - See Telephone Encounter >> Jul 17, 2018 10:34 AM Marja Kays F wrote: Pt is needing to have something called in for a possible yeast infection  Best number [phone]  [facility] [place]

## 2018-07-21 DIAGNOSIS — Z130 Encounter for screening for diseases of the blood and blood-forming organs and certain disorders involving the immune mechanism: Secondary | ICD-10-CM | POA: Diagnosis not present

## 2018-07-21 DIAGNOSIS — Z1389 Encounter for screening for other disorder: Secondary | ICD-10-CM | POA: Diagnosis not present

## 2018-07-21 DIAGNOSIS — Z1231 Encounter for screening mammogram for malignant neoplasm of breast: Secondary | ICD-10-CM | POA: Diagnosis not present

## 2018-07-21 DIAGNOSIS — Z01419 Encounter for gynecological examination (general) (routine) without abnormal findings: Secondary | ICD-10-CM | POA: Diagnosis not present

## 2018-07-27 ENCOUNTER — Other Ambulatory Visit: Payer: Self-pay | Admitting: Obstetrics and Gynecology

## 2018-07-27 DIAGNOSIS — N6489 Other specified disorders of breast: Secondary | ICD-10-CM

## 2018-07-27 DIAGNOSIS — N6310 Unspecified lump in the right breast, unspecified quadrant: Secondary | ICD-10-CM

## 2018-07-31 ENCOUNTER — Ambulatory Visit
Admission: RE | Admit: 2018-07-31 | Discharge: 2018-07-31 | Disposition: A | Discharge status: Home / Self Care | Payer: 59 | Source: Ambulatory Visit | Attending: Obstetrics and Gynecology | Admitting: Obstetrics and Gynecology

## 2018-07-31 ENCOUNTER — Other Ambulatory Visit: Payer: Self-pay | Admitting: Obstetrics and Gynecology

## 2018-07-31 DIAGNOSIS — N6310 Unspecified lump in the right breast, unspecified quadrant: Secondary | ICD-10-CM

## 2018-07-31 DIAGNOSIS — N6320 Unspecified lump in the left breast, unspecified quadrant: Secondary | ICD-10-CM

## 2018-07-31 DIAGNOSIS — N6489 Other specified disorders of breast: Secondary | ICD-10-CM

## 2018-07-31 DIAGNOSIS — R922 Inconclusive mammogram: Secondary | ICD-10-CM | POA: Diagnosis not present

## 2018-07-31 DIAGNOSIS — N6001 Solitary cyst of right breast: Secondary | ICD-10-CM | POA: Diagnosis not present

## 2018-08-04 ENCOUNTER — Ambulatory Visit
Admission: RE | Admit: 2018-08-04 | Discharge: 2018-08-04 | Disposition: A | Discharge status: Home / Self Care | Payer: 59 | Source: Ambulatory Visit | Attending: Obstetrics and Gynecology | Admitting: Obstetrics and Gynecology

## 2018-08-04 ENCOUNTER — Other Ambulatory Visit: Payer: Self-pay | Admitting: Obstetrics and Gynecology

## 2018-08-04 DIAGNOSIS — N6320 Unspecified lump in the left breast, unspecified quadrant: Secondary | ICD-10-CM

## 2018-08-04 DIAGNOSIS — N6012 Diffuse cystic mastopathy of left breast: Secondary | ICD-10-CM | POA: Diagnosis not present

## 2018-08-04 DIAGNOSIS — N6324 Unspecified lump in the left breast, lower inner quadrant: Secondary | ICD-10-CM | POA: Diagnosis not present

## 2018-08-04 DIAGNOSIS — N6312 Unspecified lump in the right breast, upper inner quadrant: Secondary | ICD-10-CM | POA: Diagnosis not present

## 2018-08-04 DIAGNOSIS — N6310 Unspecified lump in the right breast, unspecified quadrant: Secondary | ICD-10-CM

## 2018-08-04 DIAGNOSIS — D241 Benign neoplasm of right breast: Secondary | ICD-10-CM | POA: Diagnosis not present

## 2018-08-04 DIAGNOSIS — D242 Benign neoplasm of left breast: Secondary | ICD-10-CM | POA: Diagnosis not present

## 2018-08-04 DIAGNOSIS — N6323 Unspecified lump in the left breast, lower outer quadrant: Secondary | ICD-10-CM | POA: Diagnosis not present

## 2018-09-20 DIAGNOSIS — N898 Other specified noninflammatory disorders of vagina: Secondary | ICD-10-CM | POA: Diagnosis not present

## 2018-10-16 ENCOUNTER — Encounter: Payer: Self-pay | Admitting: Internal Medicine

## 2018-10-16 ENCOUNTER — Ambulatory Visit: Payer: 59 | Admitting: Internal Medicine

## 2018-10-16 DIAGNOSIS — H04309 Unspecified dacryocystitis of unspecified lacrimal passage: Secondary | ICD-10-CM | POA: Insufficient documentation

## 2018-10-16 DIAGNOSIS — H04303 Unspecified dacryocystitis of bilateral lacrimal passages: Secondary | ICD-10-CM

## 2018-10-16 MED ORDER — METHYLPREDNISOLONE 4 MG PO TBPK: ORAL_TABLET | ORAL | 0 refills | Status: DC

## 2018-10-16 NOTE — Progress Notes (Signed)
Subjective:    Patient ID: Sandy Hanna, female    DOB: 1966-02-23, 52 y.o.   MRN: [id number]  HPI The patient is here for an acute visit for eye pain.  She gets this twice a year.  It starts with tear duct pain.  It seems like there is a plug of mucus that gets stuck and the pain is severe.  She uses erythromycin ointment and that helps, but this time it did not help.  She started a zpak two days ago (she had a refill on a prescription she did not use).  She was concerned she may be getting a sinus infection and that was causing the clogged tear ducts.  She is drinking a lot of water.    Yesterday she felt better.  She stopped using the erythromycin ointment.  She went to work and today both eyes started tingling and hurting again in the tear duct.  She started coughing and her ears and throat started to hurt a little.  She also developed a mild sore throat.  She has never discussed it with her eye doctor.    She has had this treated here in the past and has been treated with antibiotics, possible steroids, eye ointments.  She is unsure when her last episode was.   Medications and allergies reviewed with patient and updated if appropriate.  Patient Active Problem List   Diagnosis Date Noted  . Back pain 06/27/2018  . Chest pain 06/27/2018  . Corn of toe 05/16/2018  . Acute upper respiratory infection 03/21/2018  . Nausea 03/21/2018  . Arthritis of metatarsophalangeal (MTP) joint of great toe 07/24/2017  . Angio-edema 05/17/2017  . Left foot pain 05/17/2017  . Left arm pain 05/17/2017  . Acute gouty arthritis 03/24/2017  . Urinary frequency 02/09/2017  . Left lumbar radiculopathy 02/09/2017  . Fever 02/09/2017  . Low back pain 01/13/2017  . Bilateral carpal tunnel syndrome 01/13/2017  . Vitamin D deficiency 03/23/2016  . Anemia, iron deficiency 03/23/2016  . Acute sinus infection 08/21/2015  . Abdominal pain 12/20/2014  . Paresthesia 05/11/2012  . Rash 01/23/2012  .  Constipation 08/10/2011  . Preventative health care 05/05/2011  . HYPERSOMNIA 12/18/2010  . FATIGUE 08/25/2010  . Hyperlipidemia 07/03/2010  . SHOULDER PAIN, RIGHT 05/28/2009  . ISCHEMIC COLITIS 04/28/2009  . Irritable bowel syndrome 04/28/2009  . RECTAL BLEEDING 04/28/2009  . COMMON MIGRAINE 12/23/2008  . Anxiety state 12/15/2007  . Depression 12/15/2007  . Allergic rhinitis 12/15/2007  . GERD 12/15/2007  . Essential hypertension 06/27/2007    Current Outpatient Medications on File Prior to Visit  Medication Sig Dispense Refill  . amLODipine (NORVASC) 10 MG tablet Take 1 tablet (10 mg total) by mouth daily. 90 tablet 3  . aspirin 81 MG EC tablet Take 1 tablet (81 mg total) by mouth daily. 30 tablet 5  . atorvastatin (LIPITOR) 20 MG tablet Take 1 tablet (20 mg total) by mouth daily. 90 tablet 3  . Cholecalciferol (VITAMIN D3) 50000 units TABS Take 1 tablet by mouth once a week. 12 tablet 0  . clotrimazole (MYCELEX) 10 MG troche Take 1 tablet (10 mg total) by mouth 5 (five) times daily. 60 tablet 1  . clotrimazole-betamethasone (LOTRISONE) cream Use as directed twice per day as needed 15 g 1  . diclofenac (VOLTAREN) 75 MG EC tablet Take 1 tablet (75 mg total) by mouth 2 (two) times daily. 30 tablet 0  . Diclofenac Sodium (PENNSAID) 2 % SOLN Place  1 application onto the skin 2 (two) times daily. 1 Bottle 2  . dicyclomine (BENTYL) 20 MG tablet Take 1 tablet (20 mg total) by mouth 3 (three) times daily as needed for spasms. 90 tablet 3  . erythromycin ophthalmic ointment Place 1 application into both eyes 4 (four) times daily. 3.5 g 1  . fluconazole (DIFLUCAN) 150 MG tablet 1 tab by mouth every 3 days as needed 2 tablet 1  . ibuprofen (ADVIL,MOTRIN) 800 MG tablet Take 1 tablet (800 mg total) by mouth every 8 (eight) hours as needed. 40 tablet 0  . pantoprazole (PROTONIX) 40 MG tablet Take 1 tablet (40 mg total) by mouth daily. 90 tablet 3   No current facility-administered medications on  file prior to visit.     Past Medical History:  Diagnosis Date  . Acute sinusitis, unspecified 12/30/2007  . ALLERGIC RHINITIS 12/15/2007  . ANEMIA-NOS 12/18/2010  . ANXIETY 12/15/2007  . BLEPHARITIS, RIGHT 04/03/2008  . CHEST PAIN 08/25/2010  . DEPRESSION 12/15/2007  . FATIGUE 08/25/2010  . GERD 12/15/2007  . HYPERLIPIDEMIA 07/03/2010  . HYPERSOMNIA 12/18/2010  . HYPERTENSION 06/27/2007  . Irritable bowel syndrome 04/28/2009  . ISCHEMIC COLITIS 04/28/2009  . Migraines 12/23/2008  . OTITIS MEDIA, BILATERAL 09/12/2008  . RECTAL BLEEDING 04/28/2009  . Vitamin D deficiency 03/23/2016    Past Surgical History:  Procedure Laterality Date  . BREAST BIOPSY  2005   Negative    Social History   Socioeconomic History  . Marital status: Single    Spouse name: Not on file  . Number of children: 1  . Years of education: Not on file  . Highest education level: Not on file  Occupational History  . 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 Financial resource strain: Not on file  . Food insecurity:    Worry: Not on file    Inability: Not on file  . Transportation needs:    Medical: Not on file    Non-medical: Not on file  Tobacco Use  . Smoking status: Current Every Day Smoker    Packs/day: 0.30    Types: Cigarettes  . Smokeless tobacco: Never Used  . Tobacco comment: 6 cigarettes a day  Substance and Sexual Activity  . Alcohol use: No  . Drug use: No  . Sexual activity: Not on file  Lifestyle  . Physical activity:    Days per week: Not on file    Minutes per session: Not on file  . Stress: Not on file  Relationships  . Social connections:    Talks on phone: Not on file    Gets together: Not on file    Attends religious service: Not on file    Active member of club or organization: Not on file    Attends meetings of clubs or organizations: Not on file    Relationship status: Not on file  Other Topics Concern  . Not on file  Social  History Narrative   Patient does not get regular exercise   Daily Caffeine- use 2 cups daily    Family History  Problem Relation Age of Onset  . Breast cancer Mother        age 74-65  . Hypertension Mother   . Diabetes Mother   . Diabetes Paternal Grandmother   . Ovarian cancer Unknown        Aunt    Review of Systems  Constitutional: Negative for chills and fever.  HENT: Positive for congestion, ear pain (it feels like it is coming today), postnasal drip, sinus pain (around nasal bridge and frontal region) and sore throat (a little sore today).   Eyes: Positive for pain (when the tear duct hurts). Negative for visual disturbance.  Respiratory: Positive for cough (started today). Negative for shortness of breath and wheezing.   Neurological: Positive for headaches. Negative for light-headedness.       Objective:   Vitals:   10/16/18 1537  BP: (!) 154/80  Pulse: 71  Resp: 18  Temp: 98.9 F (37.2 C)  SpO2: 99%   BP Readings from Last 3 Encounters:  10/16/18 (!) 154/80  06/27/18 122/80  05/16/18 124/76   Wt Readings from Last 3 Encounters:  10/16/18 165 lb (74.8 kg)  06/27/18 166 lb (75.3 kg)  05/16/18 167 lb (75.8 kg)   Body mass index is 30.18 kg/m.   Physical Exam    GENERAL APPEARANCE: Appears stated age, well appearing, NAD EYES: conjunctiva clear, no icterus, tenderness bilateral medial tear ducts, no active eye discharge HEENT: bilateral tympanic membranes and ear canals normal, oropharynx with no erythema, no thyromegaly, trachea midline, no cervical or supraclavicular lymphadenopathy LUNGS: Clear to auscultation without wheeze or crackles, unlabored breathing, good air entry bilaterally CARDIOVASCULAR: Normal S1,S2 without murmurs, no edema SKIN: Warm, dry      Assessment & Plan:    See Problem List for Assessment and Plan of chronic medical problems.

## 2018-10-16 NOTE — Assessment & Plan Note (Signed)
She is experiencing bilateral tear duct blockage-this has occurred in the past It may or may not be related to a sinus infection Continue erythromycin ointment Since she is already started the Z-Pak would recommend that she finishes that She thinks steroid may have helped in the past-we will do Medrol Dosepak, which may help with some inflammation in the sinus and tear duct regions Strongly recommended that she sees an ophthalmologist for further evaluation of her tear ducts-they may need to be dilated

## 2018-10-16 NOTE — Patient Instructions (Addendum)
Continue the erythromycin ointment.  Complete the zpak.  Take the Prednisone as prescribed.     You should see an ophthalmologist for further evaluation of your tear ducts.    Dacryocystitis Dacryocystitis is an infection of the sac that collects tears (lacrimal sac). The lacrimal sac is located between the inner corner of the eye and the nose. The glands of the eyelids make tears that keep the surface of the eye wet and protected. Tears drain from two small tubes (ducts) in the eyelids. These ducts carry tears to the lacrimal sac. Another tube (nasolacrimal duct) carries tears from the lacrimal sac down into the nose. Dacryocystitis can be sudden (acute) or long-lasting (chronic). It usually affects only one eye. What are the causes? The most common cause of this condition is a blocked nasolacrimal duct. When this duct is blocked, tears cannot drain into the nose, and tears become backed up in the lacrimal sac. Bacteria that normally live in the eye, on the skin, or in the nose start to grow inside the sac and cause infection. The nasolacrimal duct may become blocked because of:  A nose or sinus infection that spreads into the duct.  A duct that is abnormally shaped (malformed).  A growth or swelling in the nose.  An injury or surgery that narrows or scars the duct.  Dacryocystitis also may start as an eye infection that spreads to the lacrimal sac. Sometimes the cause of dacryocystitis is not known. What increases the risk? This condition is more likely to develop in people who:  Are older than 40.  Are female. Women tend to have a narrower nasolacrimal duct than men.  Have had nasal trauma, such as a broken nose or nasal surgery.  Have nasal polyps.  What are the signs or symptoms? Symptoms of acute dacryocystitis start suddenly and may include:  Excessive tearing.  A matted, watery eye.  Swelling and redness over the lacrimal sac.  Discharge of mucus or pus into the eye.  This may cause blurred vision.  Eye pain.  Fever.  Symptoms of chronic dacryocystitis usually include:  Excessive tearing.  Discharge of mucus or pus into the eye.  Blurred vision.  Redness, pain, and swelling are less common with chronic dacryocystitis. How is this diagnosed? This condition is diagnosed based on your medical history and a physical exam. During the exam, your health care provider may press between your eye and the side of your nose to see if discharge flows back into your eye. You may also have tests, such as:  Removal of a sample of your eye or nose discharge to check for infection.  A dye disappearance test. During this test, your health care provider will put a yellow dye in your eye to see if the dye disappears from your eye. A swab may be placed in your nose to see if the dye drains to your nose.  Nasal endoscopy. For this test, a thin, lighted scope (endoscope) is placed in your nose to determine what is causing the duct blockage.  How is this treated? Acute dacryocystitis is treated with antibiotic medicines. These are usually given by mouth (orally), but they can also be given as eye drops or ointments. If the infection has spread to tissues around the eye (orbital cellulitis), antibiotics may be given through an IV tube. Chronic dacryocystitis usually needs to be treated with surgery. Surgical options include:  Probing the duct to open it.  Widening the duct.  Removing a nasal blockage.  Follow these instructions at home:  If directed by your health care provider, apply a clean warm compress to the inside corner of your eye. To do this: ? Wash your hands first. ? Hold the compress over the inside corner of your eye for a few minutes. ? Repeat this every few hours during the day.  Take or apply your antibiotic medicine, drops, or ointment as told by your health care provider. Do not stop taking or applying the antibiotic even if you start to feel  better.  Take over-the-counter and prescription medicines only as told by your health care provider.  Keep all follow-up visits as told by your health care provider. This is important. Contact a health care provider if:  You have a fever.  Your symptoms come back, do not improve, or get worse. Get help right away if:  You have redness, swelling, and pain that spread to the tissues around your eye.  You have a sudden decrease in your vision. This information is not intended to replace advice given to you by your health care provider. Make sure you discuss any questions you have with your health care provider. Document Released: 11/05/2000 Document Revised: 03/19/2016 Document Reviewed: 09/29/2015 Elsevier Interactive Patient Education  [employer].

## 2018-10-17 ENCOUNTER — Encounter

## 2019-01-15 ENCOUNTER — Other Ambulatory Visit (INDEPENDENT_AMBULATORY_CARE_PROVIDER_SITE_OTHER): Payer: 59

## 2019-01-15 ENCOUNTER — Encounter: Payer: Self-pay | Admitting: Family

## 2019-01-15 ENCOUNTER — Ambulatory Visit (INDEPENDENT_AMBULATORY_CARE_PROVIDER_SITE_OTHER)
Admission: RE | Admit: 2019-01-15 | Discharge: 2019-01-15 | Disposition: A | Discharge status: Home / Self Care | Payer: 59 | Source: Ambulatory Visit | Attending: Family | Admitting: Family

## 2019-01-15 ENCOUNTER — Ambulatory Visit: Payer: 59 | Admitting: Family

## 2019-01-15 ENCOUNTER — Other Ambulatory Visit: Payer: Self-pay | Admitting: Family

## 2019-01-15 VITALS — BP 128/78 | HR 80 | Temp 98.4°F | Ht 62.0 in | Wt 164.1 lb

## 2019-01-15 DIAGNOSIS — R5383 Other fatigue: Secondary | ICD-10-CM

## 2019-01-15 DIAGNOSIS — R0609 Other forms of dyspnea: Secondary | ICD-10-CM

## 2019-01-15 DIAGNOSIS — R0602 Shortness of breath: Secondary | ICD-10-CM | POA: Diagnosis not present

## 2019-01-15 DIAGNOSIS — R0600 Dyspnea, unspecified: Secondary | ICD-10-CM

## 2019-01-15 LAB — VITAMIN B12: VITAMIN B 12: 235 pg/mL (ref 211–911)

## 2019-01-15 LAB — CBC WITH DIFFERENTIAL/PLATELET
Basophils Absolute: 0.1 10*3/uL (ref 0.0–0.1)
Basophils Relative: 1.7 % (ref 0.0–3.0)
EOS PCT: 1 % (ref 0.0–5.0)
Eosinophils Absolute: 0.1 10*3/uL (ref 0.0–0.7)
HCT: 39.8 % (ref 36.0–46.0)
Hemoglobin: 13.3 g/dL (ref 12.0–15.0)
LYMPHS ABS: 3.4 10*3/uL (ref 0.7–4.0)
Lymphocytes Relative: 38.8 % (ref 12.0–46.0)
MCHC: 33.5 g/dL (ref 30.0–36.0)
MCV: 83.9 fl (ref 78.0–100.0)
MONO ABS: 0.7 10*3/uL (ref 0.1–1.0)
Monocytes Relative: 7.6 % (ref 3.0–12.0)
NEUTROS PCT: 50.9 % (ref 43.0–77.0)
Neutro Abs: 4.4 10*3/uL (ref 1.4–7.7)
Platelets: 243 10*3/uL (ref 150.0–400.0)
RBC: 4.74 Mil/uL (ref 3.87–5.11)
RDW: 15.3 % (ref 11.5–15.5)
WBC: 8.7 10*3/uL (ref 4.0–10.5)

## 2019-01-15 LAB — COMPREHENSIVE METABOLIC PANEL
ALK PHOS: 126 U/L — AB (ref 39–117)
ALT: 11 U/L (ref 0–35)
AST: 12 U/L (ref 0–37)
Albumin: 4.2 g/dL (ref 3.5–5.2)
BILIRUBIN TOTAL: 0.3 mg/dL (ref 0.2–1.2)
BUN: 6 mg/dL (ref 6–23)
CO2: 26 mEq/L (ref 19–32)
Calcium: 9.2 mg/dL (ref 8.4–10.5)
Chloride: 104 mEq/L (ref 96–112)
Creatinine, Ser: 0.72 mg/dL (ref 0.40–1.20)
GFR: 102.39 mL/min (ref >60.00)
GLUCOSE: 83 mg/dL (ref 70–99)
POTASSIUM: 3.8 meq/L (ref 3.5–5.1)
SODIUM: 139 meq/L (ref 135–145)
TOTAL PROTEIN: 7.4 g/dL (ref 6.0–8.3)

## 2019-01-15 LAB — VITAMIN D 25 HYDROXY (VIT D DEFICIENCY, FRACTURES): VITD: 15.73 ng/mL — ABNORMAL LOW (ref 30.00–100.00)

## 2019-01-15 MED ORDER — VITAMIN D (ERGOCALCIFEROL) 1.25 MG (50000 UNIT) PO CAPS: 50000.0000 [IU] | ORAL_CAPSULE | ORAL | 0 refills | Status: AC

## 2019-01-15 MED ORDER — AZITHROMYCIN 250 MG PO TABS: ORAL_TABLET | ORAL | 0 refills | Status: DC

## 2019-01-15 MED ORDER — FLUTICASONE PROPIONATE 50 MCG/ACT NA SUSP: 2.0000 | Freq: Every day | NASAL | 6 refills | Status: AC

## 2019-01-15 NOTE — Progress Notes (Signed)
Sandy Hanna is a 53 y.o. female with the following history as recorded in [employer]:  Patient Active Problem List   Diagnosis Date Noted  . Dacryocystitis 10/16/2018  . Back pain 06/27/2018  . Chest pain 06/27/2018  . Corn of toe 05/16/2018  . Acute upper respiratory infection 03/21/2018  . Nausea 03/21/2018  . Arthritis of metatarsophalangeal (MTP) joint of great toe 07/24/2017  . Angio-edema 05/17/2017  . Left foot pain 05/17/2017  . Left arm pain 05/17/2017  . Acute gouty arthritis 03/24/2017  . Urinary frequency 02/09/2017  . Left lumbar radiculopathy 02/09/2017  . Fever 02/09/2017  . Low back pain 01/13/2017  . Bilateral carpal tunnel syndrome 01/13/2017  . Vitamin D deficiency 03/23/2016  . Anemia, iron deficiency 03/23/2016  . Acute sinus infection 08/21/2015  . Abdominal pain 12/20/2014  . Paresthesia 05/11/2012  . Rash 01/23/2012  . Constipation 08/10/2011  . Preventative health care 05/05/2011  . HYPERSOMNIA 12/18/2010  . FATIGUE 08/25/2010  . Hyperlipidemia 07/03/2010  . SHOULDER PAIN, RIGHT 05/28/2009  . ISCHEMIC COLITIS 04/28/2009  . Irritable bowel syndrome 04/28/2009  . RECTAL BLEEDING 04/28/2009  . COMMON MIGRAINE 12/23/2008  . Anxiety state 12/15/2007  . Depression 12/15/2007  . Allergic rhinitis 12/15/2007  . GERD 12/15/2007  . Essential hypertension 06/27/2007    Current Outpatient Medications  Medication Sig Dispense Refill  . amLODipine (NORVASC) 10 MG tablet Take 1 tablet (10 mg total) by mouth daily. 90 tablet 3  . aspirin 81 MG EC tablet Take 1 tablet (81 mg total) by mouth daily. 30 tablet 5  . clotrimazole-betamethasone (LOTRISONE) cream Use as directed twice per day as needed 15 g 1  . dicyclomine (BENTYL) 20 MG tablet Take 1 tablet (20 mg total) by mouth 3 (three) times daily as needed for spasms. 90 tablet 3  . erythromycin ophthalmic ointment Place 1 application into both eyes 4 (four) times daily. 3.5 g 1  . ibuprofen (ADVIL,MOTRIN)  800 MG tablet Take 1 tablet (800 mg total) by mouth every 8 (eight) hours as needed. 40 tablet 0  . methocarbamol (ROBAXIN) 500 MG tablet methocarbamol 500 mg tablet    . pantoprazole (PROTONIX) 40 MG tablet Take 1 tablet (40 mg total) by mouth daily. 90 tablet 3  . azithromycin (ZITHROMAX) 250 MG tablet 2 tabs po qd x 1 day; 1 tablet per day x 4 days; 6 tablet 0  . fluticasone (FLONASE) 50 MCG/ACT nasal spray Place 2 sprays into both nostrils daily. 16 g 6   No current facility-administered medications for this visit.     Allergies: Amoxicillin-pot clavulanate; Clarithromycin; Doxycycline; and Lisinopril  Past Medical History:  Diagnosis Date  . Acute sinusitis, unspecified 12/30/2007  . ALLERGIC RHINITIS 12/15/2007  . ANEMIA-NOS 12/18/2010  . ANXIETY 12/15/2007  . BLEPHARITIS, RIGHT 04/03/2008  . CHEST PAIN 08/25/2010  . DEPRESSION 12/15/2007  . FATIGUE 08/25/2010  . GERD 12/15/2007  . HYPERLIPIDEMIA 07/03/2010  . HYPERSOMNIA 12/18/2010  . HYPERTENSION 06/27/2007  . Irritable bowel syndrome 04/28/2009  . ISCHEMIC COLITIS 04/28/2009  . Migraines 12/23/2008  . OTITIS MEDIA, BILATERAL 09/12/2008  . RECTAL BLEEDING 04/28/2009  . Vitamin D deficiency 03/23/2016    Past Surgical History:  Procedure Laterality Date  . BREAST BIOPSY  2005   Negative    Family History  Problem Relation Age of Onset  . Breast cancer Mother        age 9-65  . Hypertension Mother   . Diabetes Mother   . Diabetes Paternal Grandmother   .  Ovarian cancer Unknown        Aunt    Social History   Tobacco Use  . Smoking status: Current Every Day Smoker    Packs/day: 0.30    Types: Cigarettes  . Smokeless tobacco: Never Used  . Tobacco comment: 6 cigarettes a day  Substance Use Topics  . Alcohol use: No    Subjective:  Patient notes that she just has not felt well for the past few days; slight congestion; thought she may have been running a fever because her lips were chapped; + body aches; denies any chest pain with  activity but feels like she may be more winded recently; no cough;  Is concerned that more is going on with her than upper respiratory illness; has been able to go to work; denies any chest pain on exertion;       Objective:  Vitals:   01/15/19 1136  BP: 128/78  Pulse: 80  Temp: 98.4 F (36.9 C)  TempSrc: Oral  SpO2: 97%  Weight: 164 lb 1.9 oz (74.4 kg)  Height: _0  (1.575 m)    General: Well developed, well nourished, in no acute distress  Skin : Warm and dry.  Head: Normocephalic and atraumatic  Eyes: Sclera and conjunctiva clear; pupils round and reactive to light; extraocular movements intact  Ears: External normal; canals clear; tympanic membranes normal  Oropharynx: Pink, supple. No suspicious lesions  Neck: Supple without thyromegaly, adenopathy  Lungs: Respirations unlabored; clear to auscultation bilaterally without wheeze, rales, rhonchi  [facility] exam: normal rate and regular rhythm.  Neurologic: Alert and oriented; speech intact; face symmetrical; moves all extremities well; CNII-XII intact without focal deficit   Assessment:  1. Other fatigue   2. Dyspnea on exertion     Plan:  Update CXR and CBC, CMP today; will go ahead and start treatment with Z-pak, Flonase; per patient, she can take Zithromax with Clindamycin allergy listed. Follow-up to be determined.  No follow-ups on file.  Orders Placed This Encounter  Procedures  . DG Chest 2 View    Standing Status:   Future    Number of Occurrences:   1    Standing Expiration Date:   03/15/2020    Order Specific Question:   Reason for Exam (SYMPTOM  OR DIAGNOSIS REQUIRED)    Answer:   dyspnea on exertion    Order Specific Question:   Is patient pregnant?    Answer:   No    Order Specific Question:   Preferred imaging location?    Answer:   [facility]    Order Specific Question:   Radiology Contrast Protocol - do NOT remove file path    Answer:   [email or web address]  .  CBC w/Diff    Standing Status:   Future    Number of Occurrences:   1    Standing Expiration Date:   01/15/2020  . Comp Met (CMET)    Standing Status:   Future    Number of Occurrences:   1    Standing Expiration Date:   01/15/2020  . B12    Standing Status:   Future    Number of Occurrences:   1    Standing Expiration Date:   01/15/2020  . Vitamin D (25 hydroxy)    Standing Status:   Future    Number of Occurrences:   1    Standing Expiration Date:   01/15/2020    Requested Prescriptions   Signed Prescriptions Disp Refills  .  azithromycin (ZITHROMAX) 250 MG tablet 6 tablet 0    Sig: 2 tabs po qd x 1 day; 1 tablet per day x 4 days;  . fluticasone (FLONASE) 50 MCG/ACT nasal spray 16 g 6    Sig: Place 2 sprays into both nostrils daily.

## 2019-02-08 ENCOUNTER — Telehealth: Payer: Self-pay | Admitting: *Deleted

## 2019-02-08 ENCOUNTER — Ambulatory Visit: Payer: 59 | Admitting: Internal Medicine

## 2019-02-08 NOTE — Telephone Encounter (Signed)
Called patient to screen for covid-19 before her visit today with PCP. Patient states that she has not had fever, cough, or any other sick symptoms. Patient shared that she feels her fatigue is due to stress and anxiety due to many changes suddenly in her home environment. Sandy Hanna explained that she is going to try mediation and relaxation techniques to see if this helps her feel better She asked that nurse cancel appointment today and stated she will make an appointment to be seen at a later date if her symptoms do not improve.

## 2019-02-09 ENCOUNTER — Other Ambulatory Visit: Payer: Self-pay

## 2019-02-09 ENCOUNTER — Ambulatory Visit: Payer: 59 | Admitting: Internal Medicine

## 2019-02-09 ENCOUNTER — Encounter: Payer: Self-pay | Admitting: Internal Medicine

## 2019-02-09 VITALS — BP 136/86 | HR 87 | Temp 98.3°F | Ht 62.0 in | Wt 161.0 lb

## 2019-02-09 DIAGNOSIS — R202 Paresthesia of skin: Secondary | ICD-10-CM

## 2019-02-09 DIAGNOSIS — I10 Essential (primary) hypertension: Secondary | ICD-10-CM | POA: Diagnosis not present

## 2019-02-09 DIAGNOSIS — E559 Vitamin D deficiency, unspecified: Secondary | ICD-10-CM | POA: Diagnosis not present

## 2019-02-09 DIAGNOSIS — F329 Major depressive disorder, single episode, unspecified: Secondary | ICD-10-CM | POA: Diagnosis not present

## 2019-02-09 DIAGNOSIS — R29818 Other symptoms and signs involving the nervous system: Secondary | ICD-10-CM

## 2019-02-09 DIAGNOSIS — F32A Depression, unspecified: Secondary | ICD-10-CM

## 2019-02-09 NOTE — Progress Notes (Signed)
Subjective:    Patient ID: Sandy Hanna, female    DOB: 18-Aug-1966, 53 y.o.   MRN: [id number]  HPI  Here to f/u after recently seen per NP, with > 3 wks unusual paresthesias seemingly all over, with generalized fatigue and weakness and low energy.  Still able to do all ADLs and  Pt denies fever, wt loss, night sweats, loss of appetite, or other constitutional symptoms  Denies worsening depressive symptoms, suicidal ideation, or panic; has ongoing anxiety. Denies worsening recent stressors, migraine, diarrhea or other unusual symptoms.  Pt denies chest pain, increased sob or doe, wheezing, orthopnea, PND, increased LE swelling, palpitations, or syncope though can be dizzy at times.  Pt denies new neurological symptoms such as new headache, or focal facial or extremity weakness.   Pt denies polydipsia, polyuria, No overt bleeding for any reason, LMP only every 2-3 mo and not overly heavy per pt.  No recent dehydration issue, No sick contacts Wt Readings from Last 3 Encounters:  02/09/19 161 lb (73 kg)  01/15/19 164 lb 1.9 oz (74.4 kg)  10/16/18 165 lb (74.8 kg)  Pt is vegatarian and wondering about some kind of vitamin deficiency.  Seen recently and advised to take vit b12 and Vit D otc,  Felt some better for a few days, then returned again. Past Medical History:  Diagnosis Date  . Acute sinusitis, unspecified 12/30/2007  . ALLERGIC RHINITIS 12/15/2007  . ANEMIA-NOS 12/18/2010  . ANXIETY 12/15/2007  . BLEPHARITIS, RIGHT 04/03/2008  . CHEST PAIN 08/25/2010  . DEPRESSION 12/15/2007  . FATIGUE 08/25/2010  . GERD 12/15/2007  . HYPERLIPIDEMIA 07/03/2010  . HYPERSOMNIA 12/18/2010  . HYPERTENSION 06/27/2007  . Irritable bowel syndrome 04/28/2009  . ISCHEMIC COLITIS 04/28/2009  . Migraines 12/23/2008  . OTITIS MEDIA, BILATERAL 09/12/2008  . RECTAL BLEEDING 04/28/2009  . Vitamin D deficiency 03/23/2016   Past Surgical History:  Procedure Laterality Date  . BREAST BIOPSY  2005   Negative    reports that she  has been smoking cigarettes. She has been smoking about 0.30 packs per day. She has never used smokeless tobacco. She reports that she does not drink alcohol or use drugs. family history includes Breast cancer in her mother; Diabetes in her mother and paternal grandmother; Hypertension in her mother; Ovarian cancer in her unknown relative. Allergies  Allergen Reactions  . Amoxicillin-Pot Clavulanate     REACTION: nausea  . Clarithromycin     REACTION: Nausea  . Doxycycline     REACTION: severe fatigue per pt  . Lisinopril Other (See Comments)    Angioedema left upper and lower lids   Current Outpatient Medications on File Prior to Visit  Medication Sig Dispense Refill  . amLODipine (NORVASC) 10 MG tablet Take 1 tablet (10 mg total) by mouth daily. 90 tablet 3  . aspirin 81 MG EC tablet Take 1 tablet (81 mg total) by mouth daily. 30 tablet 5  . azithromycin (ZITHROMAX) 250 MG tablet 2 tabs po qd x 1 day; 1 tablet per day x 4 days; 6 tablet 0  . clotrimazole-betamethasone (LOTRISONE) cream Use as directed twice per day as needed 15 g 1  . dicyclomine (BENTYL) 20 MG tablet Take 1 tablet (20 mg total) by mouth 3 (three) times daily as needed for spasms. 90 tablet 3  . erythromycin ophthalmic ointment Place 1 application into both eyes 4 (four) times daily. 3.5 g 1  . fluticasone (FLONASE) 50 MCG/ACT nasal spray Place 2 sprays into both nostrils  daily. 16 g 6  . ibuprofen (ADVIL,MOTRIN) 800 MG tablet Take 1 tablet (800 mg total) by mouth every 8 (eight) hours as needed. 40 tablet 0  . methocarbamol (ROBAXIN) 500 MG tablet methocarbamol 500 mg tablet    . pantoprazole (PROTONIX) 40 MG tablet Take 1 tablet (40 mg total) by mouth daily. 90 tablet 3  . Vitamin D, Ergocalciferol, (DRISDOL) 1.25 MG (50000 UT) CAPS capsule Take 1 capsule (50,000 Units total) by mouth every 7 (seven) days for 12 doses. 12 capsule 0   No current facility-administered medications on file prior to visit.    Review of  Systems  Constitutional: Negative for other unusual diaphoresis or sweats HENT: Negative for ear discharge or swelling Eyes: Negative for other worsening visual disturbances Respiratory: Negative for stridor or other swelling  Gastrointestinal: Negative for worsening distension or other blood Genitourinary: Negative for retention or other urinary change Musculoskeletal: Negative for other MSK pain or swelling Skin: Negative for color change or other new lesions Neurological: Negative for worsening tremors and other numbness  Psychiatric/Behavioral: Negative for worsening agitation or other fatigue All other system neg per pt    Objective:   Physical Exam BP 136/86   Pulse 87   Temp 98.3 F (36.8 C) (Oral)   Ht 5\' 2"  (1.575 m)   Wt 161 lb (73 kg)   SpO2 96%   BMI 29.45 kg/m  VS noted,  Constitutional: Pt appears in NAD HENT: Head: NCAT.  Right Ear: External ear normal.  Left Ear: External ear normal.  Eyes: . Pupils are equal, round, and reactive to light. Conjunctivae and EOM are normal Nose: without d/c or deformity Neck: Neck supple. Gross normal ROM Cardiovascular: Normal rate and regular rhythm.   Pulmonary/Chest: Effort normal and breath sounds without rales or wheezing.  Abd:  Soft, NT, ND, + BS, no organomegaly Neurological: Pt is alert. At baseline orientation, motor grossly intact, cn 2-12 intact, sens intact to LT Skin: Skin is warm. No rashes, other new lesions, no LE edema Psychiatric: Pt behavior is normal without agitation , + mild nervous No other exam findings  Lab Results  Component Value Date   WBC 8.7 01/15/2019   HGB 13.3 01/15/2019   HCT 39.8 01/15/2019   PLT 243.0 01/15/2019   GLUCOSE 83 01/15/2019   CHOL 233 (H) 03/30/2018   TRIG 94.0 03/30/2018   HDL 52.10 03/30/2018   LDLDIRECT 206.8 06/25/2010   LDLCALC 162 (H) 03/30/2018   ALT 11 01/15/2019   AST 12 01/15/2019   NA 139 01/15/2019   K 3.8 01/15/2019   CL 104 01/15/2019   CREATININE  0.72 01/15/2019   BUN 6 01/15/2019   CO2 26 01/15/2019   TSH 1.32 03/30/2018   B12  Order: [id number]  Status:  Final result Visible to patient:  No (Not Released) Next appt:  04/05/2019 at 09:20 AM in Internal Medicine [name], MD) Dx:  Other fatigue   Ref Range & Units 3wk ago  Vitamin B-12 211 - 911 pg/mL 235        Contains abnormal data Vitamin D (25 hydroxy)  Order: [id number]   Status:  Final result Visible to patient:  No (Not Released) Next appt:  04/05/2019 at 09:20 AM in Internal Medicine [name], MD) Dx:  Other fatigue   Ref Range & Units 3wk ago 55yr ago  VITD 30.00 - 100.00 ng/mL 15.73Low   17.21Low             Assessment &  Plan:

## 2019-02-09 NOTE — Patient Instructions (Signed)
You will be contacted regarding the referral for: MRI for the head  Please continue all other medications as before, and refills have been done if requested.  Please have the pharmacy call with any other refills you may need.  Please keep your appointments with your specialists as you may have planned  You will be contacted by phone if any changes need to be made immediately.  Otherwise, you will receive a letter about your results with an explanation, but please check with MyChart first.  In order to help keep our patients safe and at home we are billing the insurance company for a health consult. You could potentially get a copay to a maximum of $15. Do you agree to this in order to obtain our advice about your concerns?

## 2019-02-11 ENCOUNTER — Encounter: Payer: Self-pay | Admitting: Internal Medicine

## 2019-02-11 NOTE — Assessment & Plan Note (Signed)
stable overall by history and exam, recent data reviewed with pt, and pt to continue medical treatment as before,  to f/u any worsening symptoms or concerns  

## 2019-02-11 NOTE — Assessment & Plan Note (Signed)
Cont same tx indefinitely

## 2019-02-11 NOTE — Assessment & Plan Note (Addendum)
Exam benign and recent b12/Vit d as above, to cont those treatments, but also for Head MRI r/o MS, but suspect may be psychiatric related

## 2019-02-15 ENCOUNTER — Ambulatory Visit: Payer: Self-pay | Admitting: *Deleted

## 2019-02-15 NOTE — Telephone Encounter (Signed)
Tried to contact patient at phone number below (also same number on chart)---it will ring a few times and then I get a busy signal---no way to leave message---I have referred back to labs she did with laura murray,NP---her vitamin D and B12 vitiman lab values are very low/low normal---this can cause dizziness,extreme fatigue, numbness and tingling---it will take several months of supplements to get these values back up---there are no further recommendations by dr Jenny Reichmann at this time---ok to talk with Emalina Dubreuil,RN at [facility] office if needed

## 2019-02-15 NOTE — Telephone Encounter (Signed)
Pt called with multiple complaints:. Nausea. Dizziness, and Numbness/ tingling "all "over", and fatigue; this has been ongoing since February 2019; the pt says that Dr Jenny Reichmann scheduled an MRI, but it can not be done until May, 2020; she says that she does not know what to do to get better?; she would like further direction; the pt says that these symptoms have not worsened; spoke with Tammie at [facility]; she request that this information be sent for provider review; the pt is normally seen by Dr [name];  will route to office for review.    Reason for Disposition . [1] Caller requesting NON-URGENT health information AND [2] PCP's office is the best resource  Answer Assessment - Initial Assessment Questions 1. REASON FOR CALL or QUESTION: "What is your reason for calling today?" or "How can I best help you?" or "What question do you have that I can help answer?"     How should I proceed since I can not have MRI until May 2020  Protocols used: INFORMATION ONLY CALL-A-AH

## 2019-02-15 NOTE — Telephone Encounter (Signed)
Unfortunately, I have nothing else specific to offer, as her exam was benign and symptoms are non specific.

## 2019-02-16 ENCOUNTER — Ambulatory Visit (INDEPENDENT_AMBULATORY_CARE_PROVIDER_SITE_OTHER): Payer: 59 | Admitting: Internal Medicine

## 2019-02-16 ENCOUNTER — Encounter: Payer: Self-pay | Admitting: Internal Medicine

## 2019-02-16 ENCOUNTER — Telehealth: Payer: Self-pay | Admitting: *Deleted

## 2019-02-16 DIAGNOSIS — F411 Generalized anxiety disorder: Secondary | ICD-10-CM

## 2019-02-16 DIAGNOSIS — R202 Paresthesia of skin: Secondary | ICD-10-CM

## 2019-02-16 DIAGNOSIS — R110 Nausea: Secondary | ICD-10-CM

## 2019-02-16 DIAGNOSIS — K5900 Constipation, unspecified: Secondary | ICD-10-CM

## 2019-02-16 MED ORDER — ALPRAZOLAM 0.25 MG PO TABS: 0.2500 mg | ORAL_TABLET | Freq: Two times a day (BID) | ORAL | 0 refills | Status: DC | PRN

## 2019-02-16 MED ORDER — ONDANSETRON HCL 4 MG PO TABS: 4.0000 mg | ORAL_TABLET | Freq: Three times a day (TID) | ORAL | 0 refills | Status: DC | PRN

## 2019-02-16 NOTE — Assessment & Plan Note (Signed)
See above

## 2019-02-16 NOTE — Patient Instructions (Signed)
See above

## 2019-02-16 NOTE — Telephone Encounter (Signed)
Noted  

## 2019-02-16 NOTE — Progress Notes (Signed)
Virtual Visit via Video Note  I connected with Sandy Hanna on 02/16/19 at 10:40 AM EDT by a video enabled telemedicine application and verified that I am speaking with the correct person using two identifiers. Pt is at home, and I am at the office.  No referring provider, no other persons involved in this meeting.   I discussed the limitations of evaluation and management by telemedicine and the availability of in person appointments. The patient expressed understanding and agreed to proceed.  History of Present Illness: Pt c/o persistent x 2 wks maybe worsening paresthesias to both legs as well bilat hands, lack of energy, fatigue, sleeping more, low appetite, several lbs wt loss recent, dizziness, mild HA for a short time yesterday.  Symptoms overall better to lie down, and better while sleeping.  Admits to worsening anxiety, Denies worsening depressive symptoms, suicidal ideation, or panic.  Denies worsening reflux, abd pain, dysphagia, vomiting, bowel change or blood, though has occasional constipation as well.  Recent b12, cmet, cbc essentially normal.  MRI has been ordered as per last visit, but has been put off to mid may due to the pandemic as routine test.   Pt denies fever, night sweats, or other constitutional symptoms except for above.  Quite fearful that if she does to sleep she might not wake up.  Plans to go stay with a friend soon.  Denies urinary symptoms such as dysuria, frequency, urgency, flank pain, hematuria or fever, chills.  Pt denies chest pain, increased sob or doe, wheezing, orthopnea, PND, increased LE swelling, palpitations, dizziness or syncope. Past Medical History:  Diagnosis Date  . Acute sinusitis, unspecified 12/30/2007  . ALLERGIC RHINITIS 12/15/2007  . ANEMIA-NOS 12/18/2010  . ANXIETY 12/15/2007  . BLEPHARITIS, RIGHT 04/03/2008  . CHEST PAIN 08/25/2010  . DEPRESSION 12/15/2007  . FATIGUE 08/25/2010  . GERD 12/15/2007  . HYPERLIPIDEMIA 07/03/2010  . HYPERSOMNIA  12/18/2010  . HYPERTENSION 06/27/2007  . Irritable bowel syndrome 04/28/2009  . ISCHEMIC COLITIS 04/28/2009  . Migraines 12/23/2008  . OTITIS MEDIA, BILATERAL 09/12/2008  . RECTAL BLEEDING 04/28/2009  . Vitamin D deficiency 03/23/2016   Past Surgical History:  Procedure Laterality Date  . BREAST BIOPSY  2005   Negative    reports that she has been smoking cigarettes. She has been smoking about 0.30 packs per day. She has never used smokeless tobacco. She reports that she does not drink alcohol or use drugs. family history includes Breast cancer in her mother; Diabetes in her mother and paternal grandmother; Hypertension in her mother; Ovarian cancer in her unknown relative. Allergies  Allergen Reactions  . Amoxicillin-Pot Clavulanate     REACTION: nausea  . Clarithromycin     REACTION: Nausea  . Doxycycline     REACTION: severe fatigue per pt  . Lisinopril Other (See Comments)    Angioedema left upper and lower lids   Current Outpatient Medications on File Prior to Visit  Medication Sig Dispense Refill  . amLODipine (NORVASC) 10 MG tablet Take 1 tablet (10 mg total) by mouth daily. 90 tablet 3  . aspirin 81 MG EC tablet Take 1 tablet (81 mg total) by mouth daily. 30 tablet 5  . azithromycin (ZITHROMAX) 250 MG tablet 2 tabs po qd x 1 day; 1 tablet per day x 4 days; 6 tablet 0  . clotrimazole-betamethasone (LOTRISONE) cream Use as directed twice per day as needed 15 g 1  . dicyclomine (BENTYL) 20 MG tablet Take 1 tablet (20 mg total) by mouth  3 (three) times daily as needed for spasms. 90 tablet 3  . erythromycin ophthalmic ointment Place 1 application into both eyes 4 (four) times daily. 3.5 g 1  . fluticasone (FLONASE) 50 MCG/ACT nasal spray Place 2 sprays into both nostrils daily. 16 g 6  . ibuprofen (ADVIL,MOTRIN) 800 MG tablet Take 1 tablet (800 mg total) by mouth every 8 (eight) hours as needed. 40 tablet 0  . methocarbamol (ROBAXIN) 500 MG tablet methocarbamol 500 mg tablet    .  pantoprazole (PROTONIX) 40 MG tablet Take 1 tablet (40 mg total) by mouth daily. 90 tablet 3  . Vitamin D, Ergocalciferol, (DRISDOL) 1.25 MG (50000 UT) CAPS capsule Take 1 capsule (50,000 Units total) by mouth every 7 (seven) days for 12 doses. 12 capsule 0   No current facility-administered medications on file prior to visit.     Observations/Objective: Alert, anxious, fearful, cn 2-12 intact, moves all 4s, does not appear acutely ill, and is mentating well without confusion, and is good historian  Assessment and Plan: Paresthesias - etiology unclear, recent b12 normal, will reorder MRI for stat to r/o MS or other, consider neurology referral and/or NCS's/EMG.    Nausea - to cont PPI as she has, for zofran prn,  to f/u any worsening symptoms or concerns  Constipation - for miralax otc prn,  to f/u any worsening symptoms or concerns  Anxiety - situationally worse at least, not clear if reactive or primary issue, for xanax prn limited rx, consider add SSRI or counseling referral for any persistent or worsening /s  Follow Up Instructions:   I discussed the assessment and treatment plan with the patient. The patient was provided an opportunity to ask questions and all were answered. The patient agreed with the plan and demonstrated an understanding of the instructions.   The patient was advised to call back or seek an in-person evaluation if the symptoms worsen or if the condition fails to improve as anticipated.  I provided 20 minutes of non-face-to-face time during this encounter.   [name], MD

## 2019-02-16 NOTE — Telephone Encounter (Signed)
Nurse received patient call from [facility] directly. Patient explains that she is feeling worse with her symptoms of paresthesias since her last visit with PCP 02/09/19. Patient states she is havingf numbness in her feet and tingling in her hands. She denies tingling and numbness in her face. She is dizzy but states she does not feel she cannot walk or that she is a fall risk. Patient explains she is nauseated and has poor appetite but she is able to drink water and fluid to stay hydrated.   Nurse explained that patient should speak to PCP regarding these symptoms and patient did agree to do a virtual visit with PCP which will be set up for today.

## 2019-02-26 ENCOUNTER — Telehealth: Payer: Self-pay | Admitting: Internal Medicine

## 2019-02-26 ENCOUNTER — Telehealth: Payer: Self-pay

## 2019-02-26 DIAGNOSIS — R202 Paresthesia of skin: Secondary | ICD-10-CM

## 2019-02-26 DIAGNOSIS — R42 Dizziness and giddiness: Secondary | ICD-10-CM

## 2019-02-26 NOTE — Addendum Note (Signed)
Addended by: Biagio Borg on: 02/26/2019 03:49 PM   Modules accepted: Orders

## 2019-02-26 NOTE — Telephone Encounter (Signed)
[place] for neurology referral - done

## 2019-02-26 NOTE — Telephone Encounter (Signed)
I just did the neurology referral  Not sure shy she would need the ENT though

## 2019-02-26 NOTE — Telephone Encounter (Signed)
Copied from [place] [id number]. Topic: Referral - Request for Referral >> Feb 26, 2019  3:18 PM Marin Olp L wrote: Has patient seen PCP for this complaint? yes *If NO, is insurance requiring patient see PCP for this issue before PCP can refer them? Referral for which specialty: neurology, ent Preferred provider/office: Dr. recommendation Reason for referral: regarding e-visit on 02/16/2019 (dizziness)

## 2019-02-26 NOTE — Telephone Encounter (Signed)
Copied from [place] [id number]. Topic: Quick Communication - See Telephone Encounter >> Feb 26, 2019  3:21 PM Valla Leaver wrote: CRM for notification. See Telephone encounter for: 02/26/19. Patient wants Dr. Jenny Reichmann to know MRI's are not being scheduled at this time and she is still dizzy, nauseous, tingling in legs. Please advise.

## 2019-03-02 ENCOUNTER — Other Ambulatory Visit: Payer: Self-pay | Admitting: Internal Medicine

## 2019-03-14 ENCOUNTER — Ambulatory Visit: Payer: Self-pay

## 2019-03-14 MED ORDER — MECLIZINE HCL 25 MG PO TABS: 25.0000 mg | ORAL_TABLET | Freq: Three times a day (TID) | ORAL | 1 refills | Status: DC | PRN

## 2019-03-14 NOTE — Telephone Encounter (Signed)
Ok for meclizine trial for possible inner ear related dizziness - done erx

## 2019-03-14 NOTE — Addendum Note (Signed)
Addended by: Biagio Borg on: 03/14/2019 11:58 AM   Modules accepted: Orders

## 2019-03-14 NOTE — Telephone Encounter (Signed)
Patient called and says she's having worsening of her dizziness that started in February. She says on Monday after exercising, she noticed the dizziness was worse. She says she intentionally moves slow due to any sudden movements can cause dizziness, but Monday was worse. She says she's noticed that her energy level has decreased since February, she is more tired and exhausted than normal. She denies CP, but says she's noticed a little tightness that she attributed to stress. She says she doesn't have a fever, no vomiting, no nausea, no diarrhea, no rectal bleeding. I placed her on hold while I call the office, called and spoke to [place], [facility] who asked to speak to the patient. The call was connected to [facility] successfully.  Answer Assessment - Initial Assessment Questions 1. DESCRIPTION: "Describe your dizziness."     If I move around, feel off balance 2. LIGHTHEADED: "Do you feel lightheaded?" (e.g., somewhat faint, woozy, weak upon standing)     Yes 3. VERTIGO: "Do you feel like either you or the room is spinning or tilting?" (i.e. vertigo)     No 4. SEVERITY: "How bad is it?"  "Do you feel like you are going to faint?" "Can you stand and walk?"   - MILD - walking normally   - MODERATE - interferes with normal activities (e.g., work, school)    - SEVERE - unable to stand, requires support to walk, feels like passing out now.      Mild to moderate 5. ONSET:  "When did the dizziness begin?"     February off and on, but worse since Monday after exercising 6. AGGRAVATING FACTORS: "Does anything make it worse?" (e.g., standing, change in head position)     Anything I do, I am intentionally moving slower 7. HEART RATE: "Can you tell me your heart rate?" "How many beats in 15 seconds?"  (Note: not all patients can do this)       N/A 8. CAUSE: "What do you think is causing the dizziness?"     I really don't know 9. RECURRENT SYMPTOM: "Have you had dizziness before?" If so, ask: "When was the last  time?" "What happened that time?"     Yes, been ongoing since February 10. OTHER SYMPTOMS: "Do you have any other symptoms?" (e.g., fever, chest pain, vomiting, diarrhea, bleeding)       Fatigue, decline in energy since February, a little chest tightness maybe from stress 11. PREGNANCY: "Is there any chance you are pregnant?" "When was your last menstrual period?"       No  Protocols used: DIZZINESS [facility]

## 2019-03-14 NOTE — Telephone Encounter (Signed)
Spoke to patient. She is needing more advice. She said that she is still having issues and does not know what to do.

## 2019-03-14 NOTE — Telephone Encounter (Signed)
FYI-  Pt has been informed but she stated that she would not be picking up the medication because she "does not do well with medications". I also asked her if she would like to set up a video visit with PCP and she declined due to already having one and being seen in the office for the same issue.

## 2019-03-14 NOTE — Telephone Encounter (Signed)
Medical screening examination/treatment/procedure(s) were performed by non-physician practitioner and as supervising physician I was immediately available for consultation/collaboration. I agree with above. James John, MD   

## 2019-03-19 DIAGNOSIS — N925 Other specified irregular menstruation: Secondary | ICD-10-CM | POA: Diagnosis not present

## 2019-04-03 ENCOUNTER — Encounter: Payer: 59 | Admitting: Internal Medicine

## 2019-04-05 ENCOUNTER — Encounter: Payer: 59 | Admitting: Internal Medicine

## 2019-04-08 ENCOUNTER — Other Ambulatory Visit: Payer: Self-pay | Admitting: Family

## 2019-04-16 ENCOUNTER — Other Ambulatory Visit: Payer: Self-pay | Admitting: Internal Medicine

## 2019-04-24 ENCOUNTER — Telehealth: Payer: Self-pay | Admitting: Neurology

## 2019-04-24 NOTE — Telephone Encounter (Signed)
Due to current COVID 19 pandemic, our office is severely reducing in office visits until further notice, in order to minimize the risk to our patients and healthcare providers.   I called patient to reschedule her appointment on 6/5 as our office is currently closed on Fridays. Patient states that she does not feel that she needs this appointment at this time because she believes she is feeling better and not experiencing the things she was when the appt was made. Patient cancelled appointment and stated that she will call back if needed.

## 2019-04-24 NOTE — Telephone Encounter (Signed)
Noted  

## 2019-04-27 ENCOUNTER — Ambulatory Visit: Payer: 59 | Admitting: Neurology

## 2019-05-10 ENCOUNTER — Encounter: Payer: Self-pay | Admitting: Gastroenterology

## 2019-05-12 IMAGING — DX DG ABDOMEN 1V
1 series · 1 of 1 positions shown · non-contrast
Comparison: None.

CLINICAL DATA: Acute right lower quadrant pain radiating to the
right thigh

EXAM:
ABDOMEN - 1 VIEW

[kub ap]
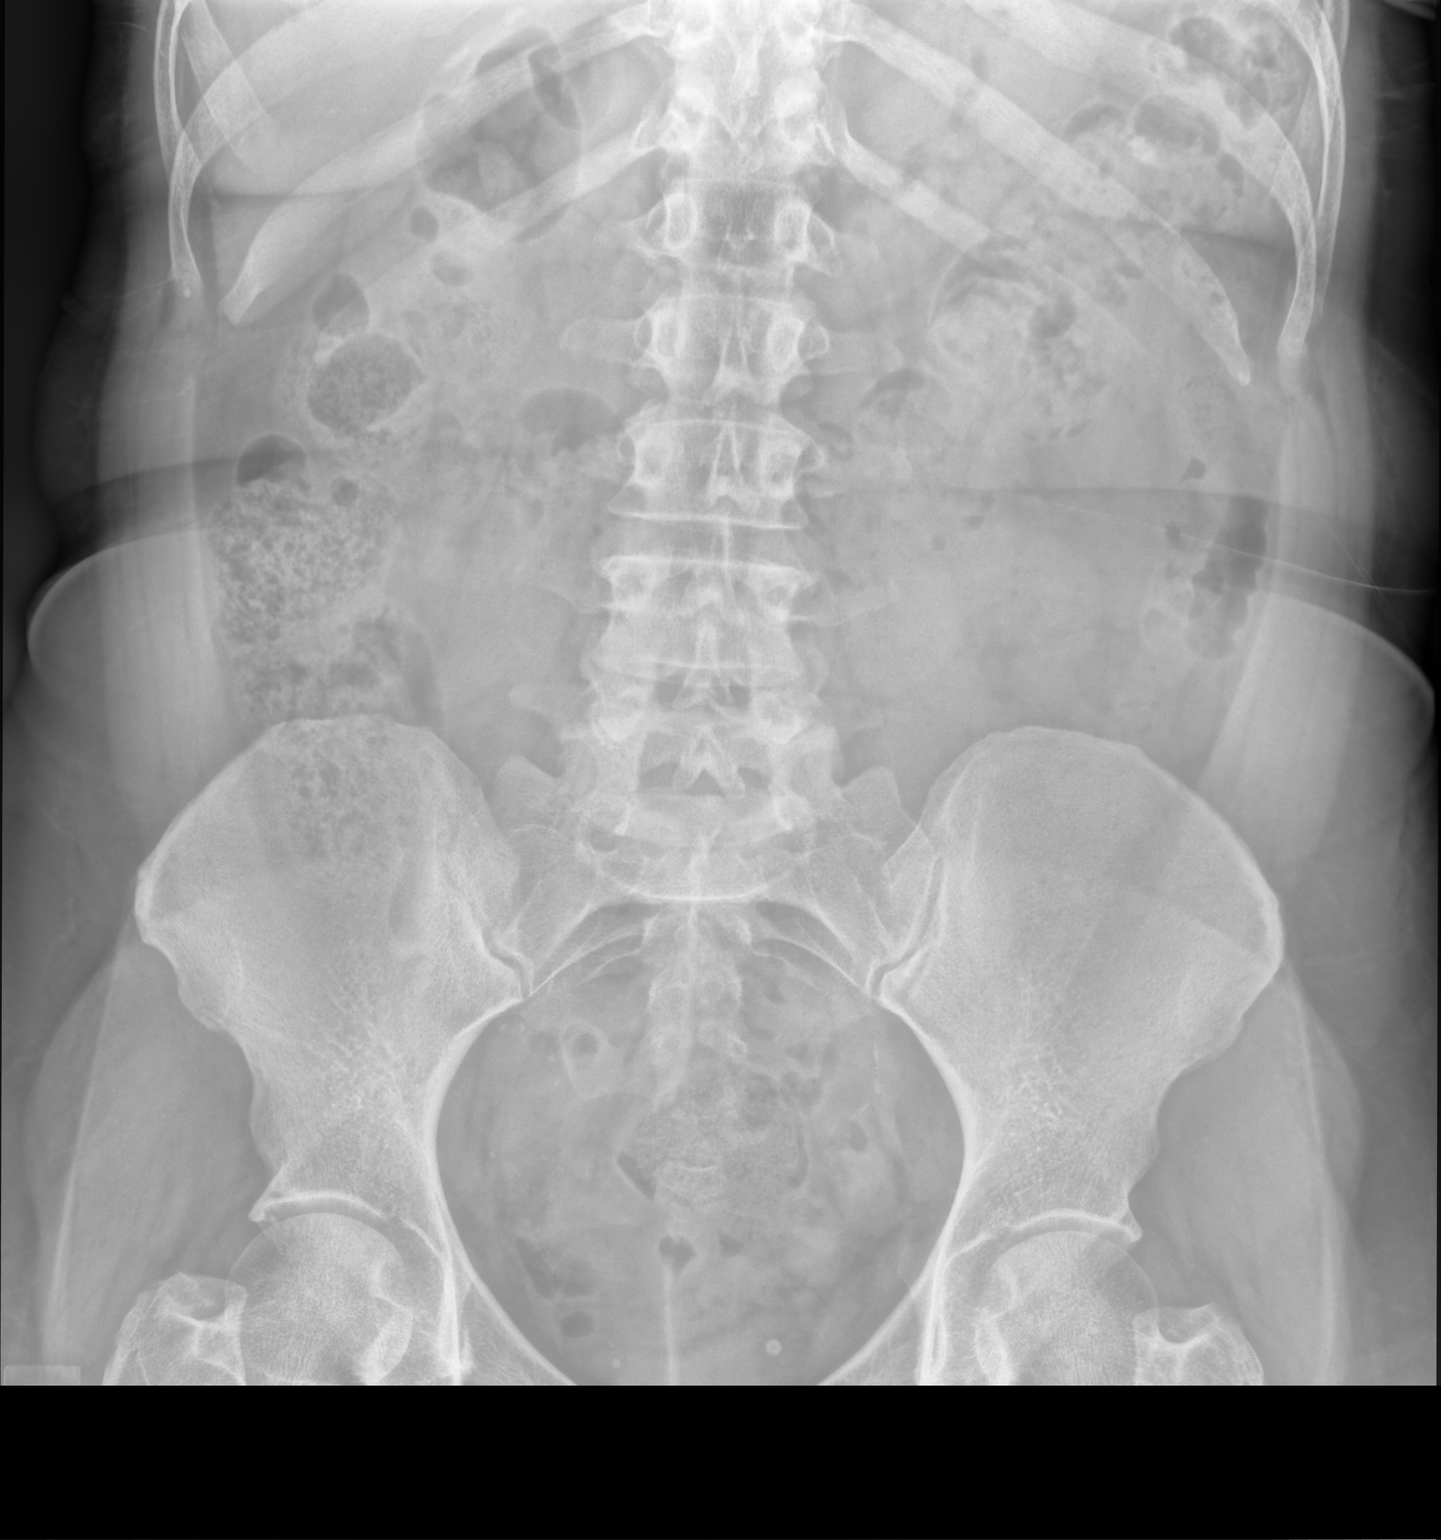

[1 of 1 positions shown; findings below may reference images not displayed]

FINDINGS: A supine film of the abdomen shows a nonspecific bowel gas pattern.
A moderate amount of feces is scattered throughout the colon.
Questions of air in the pelvis may represent colonic diverticula. No
opaque calculi are seen. The bones are unremarkable.
IMPRESSION: 1. Moderate amount of feces throughout the colon. No bowel
obstruction.
2. Collections of air on this abdominal film may represent the
presence of multiple colonic diverticula.

## 2019-05-13 ENCOUNTER — Other Ambulatory Visit: Payer: Self-pay | Admitting: Internal Medicine

## 2019-05-18 ENCOUNTER — Other Ambulatory Visit: Payer: Self-pay

## 2019-05-18 ENCOUNTER — Encounter: Payer: Self-pay | Admitting: Internal Medicine

## 2019-05-18 ENCOUNTER — Ambulatory Visit (INDEPENDENT_AMBULATORY_CARE_PROVIDER_SITE_OTHER): Payer: 59 | Admitting: Internal Medicine

## 2019-05-18 VITALS — BP 118/76 | HR 87 | Temp 98.2°F | Ht 62.0 in | Wt 158.0 lb

## 2019-05-18 DIAGNOSIS — Z0000 Encounter for general adult medical examination without abnormal findings: Secondary | ICD-10-CM | POA: Diagnosis not present

## 2019-05-18 DIAGNOSIS — E785 Hyperlipidemia, unspecified: Secondary | ICD-10-CM

## 2019-05-18 DIAGNOSIS — E559 Vitamin D deficiency, unspecified: Secondary | ICD-10-CM | POA: Diagnosis not present

## 2019-05-18 DIAGNOSIS — E611 Iron deficiency: Secondary | ICD-10-CM

## 2019-05-18 DIAGNOSIS — R202 Paresthesia of skin: Secondary | ICD-10-CM | POA: Diagnosis not present

## 2019-05-18 DIAGNOSIS — E538 Deficiency of other specified B group vitamins: Secondary | ICD-10-CM

## 2019-05-18 MED ORDER — AMLODIPINE BESYLATE 10 MG PO TABS: 10.0000 mg | ORAL_TABLET | Freq: Every day | ORAL | 1 refills | Status: DC

## 2019-05-18 MED ORDER — PANTOPRAZOLE SODIUM 40 MG PO TBEC: 40.0000 mg | DELAYED_RELEASE_TABLET | Freq: Every day | ORAL | 1 refills | Status: DC

## 2019-05-18 MED ORDER — ROSUVASTATIN CALCIUM 20 MG PO TABS: 20.0000 mg | ORAL_TABLET | ORAL | 3 refills | Status: DC

## 2019-05-18 NOTE — Patient Instructions (Signed)
Please take all new medication as prescribed - the crestor 20 mg every other day  Please continue all other medications as before, and refills have been done if requested.  Please have the pharmacy call with any other refills you may need.  Please continue your efforts at being more active, low cholesterol diet, and weight control.  You are otherwise up to date with prevention measures today.  Please keep your appointments with your specialists as you may have planned  Please return in 1 year for your yearly visit, or sooner if needed, with Lab testing done 3-5 days before

## 2019-05-18 NOTE — Progress Notes (Signed)
Subjective:    Patient ID: Sandy Hanna, female    DOB: Sep 13, 1966, 53 y.o.   MRN: [id number]  HPI  Here for wellness and f/u;  Overall doing ok;  Pt denies Chest pain, worsening SOB, DOE, wheezing, orthopnea, PND, worsening LE edema, palpitations, dizziness or syncope.  Pt denies neurological change such as new headache, facial or extremity weakness.  Pt denies polydipsia, polyuria, or low sugar symptoms. Pt states overall good compliance with treatment and medications, good tolerability, and has been trying to follow appropriate diet.  Pt denies worsening depressive symptoms, suicidal ideation or panic. No fever, night sweats, wt loss, loss of appetite, or other constitutional symptoms.  Pt states good ability with ADL's, has low fall risk, home safety reviewed and adequate, no other significant changes in hearing or vision, and only occasionally active with exercise.  Plans to schedule for Aug 2020 colonoscopy and will call.  Wt Readings from Last 3 Encounters:  05/18/19 158 lb (71.7 kg)  02/09/19 161 lb (73 kg)  01/15/19 164 lb 1.9 oz (74.4 kg)   Past Medical History:  Diagnosis Date  . Acute sinusitis, unspecified 12/30/2007  . ALLERGIC RHINITIS 12/15/2007  . ANEMIA-NOS 12/18/2010  . ANXIETY 12/15/2007  . BLEPHARITIS, RIGHT 04/03/2008  . CHEST PAIN 08/25/2010  . DEPRESSION 12/15/2007  . FATIGUE 08/25/2010  . GERD 12/15/2007  . HYPERLIPIDEMIA 07/03/2010  . HYPERSOMNIA 12/18/2010  . HYPERTENSION 06/27/2007  . Irritable bowel syndrome 04/28/2009  . ISCHEMIC COLITIS 04/28/2009  . Migraines 12/23/2008  . OTITIS MEDIA, BILATERAL 09/12/2008  . RECTAL BLEEDING 04/28/2009  . Vitamin D deficiency 03/23/2016   Past Surgical History:  Procedure Laterality Date  . BREAST BIOPSY  2005   Negative    reports that she has been smoking cigarettes. She has been smoking about 0.30 packs per day. She has never used smokeless tobacco. She reports that she does not drink alcohol or use drugs. family history  includes Breast cancer in her mother; Diabetes in her mother and paternal grandmother; Hypertension in her mother; Ovarian cancer in her unknown relative. Allergies  Allergen Reactions  . Amoxicillin-Pot Clavulanate     REACTION: nausea  . Clarithromycin     REACTION: Nausea  . Doxycycline     REACTION: severe fatigue per pt  . Lipitor [Atorvastatin Calcium] Other (See Comments)    Myalgias joint pain  . Lisinopril Other (See Comments)    Angioedema left upper and lower lids   Current Outpatient Medications on File Prior to Visit  Medication Sig Dispense Refill  . ALPRAZolam (XANAX) 0.25 MG tablet Take 1 tablet (0.25 mg total) by mouth 2 (two) times daily as needed for anxiety. 40 tablet 0  . aspirin 81 MG EC tablet Take 1 tablet (81 mg total) by mouth daily. 30 tablet 5  . azithromycin (ZITHROMAX) 250 MG tablet 2 tabs po qd x 1 day; 1 tablet per day x 4 days; 6 tablet 0  . clotrimazole-betamethasone (LOTRISONE) cream Use as directed twice per day as needed 15 g 1  . dicyclomine (BENTYL) 20 MG tablet Take 1 tablet (20 mg total) by mouth 3 (three) times daily as needed for spasms. 90 tablet 3  . erythromycin ophthalmic ointment Place 1 application into both eyes 4 (four) times daily. 3.5 g 1  . fluticasone (FLONASE) 50 MCG/ACT nasal spray Place 2 sprays into both nostrils daily. 16 g 6  . ibuprofen (ADVIL,MOTRIN) 800 MG tablet Take 1 tablet (800 mg total) by mouth every 8 (  eight) hours as needed. 40 tablet 0  . meclizine (ANTIVERT) 25 MG tablet Take 1 tablet (25 mg total) by mouth 3 (three) times daily as needed for dizziness. 30 tablet 1  . methocarbamol (ROBAXIN) 500 MG tablet methocarbamol 500 mg tablet    . ondansetron (ZOFRAN) 4 MG tablet Take 1 tablet (4 mg total) by mouth every 8 (eight) hours as needed for nausea or vomiting. 30 tablet 0   No current facility-administered medications on file prior to visit.    Review of Systems Constitutional: Negative for other unusual  diaphoresis, sweats, appetite or weight changes HENT: Negative for other worsening hearing loss, ear pain, facial swelling, mouth sores or neck stiffness.   Eyes: Negative for other worsening pain, redness or other visual disturbance.  Respiratory: Negative for other stridor or swelling Cardiovascular: Negative for other palpitations or other chest pain  Gastrointestinal: Negative for worsening diarrhea or loose stools, blood in stool, distention or other pain Genitourinary: Negative for hematuria, flank pain or other change in urine volume.  Musculoskeletal: Negative for myalgias or other joint swelling.  Skin: Negative for other color change, or other wound or worsening drainage.  Neurological: Negative for other syncope or numbness. Hematological: Negative for other adenopathy or swelling Psychiatric/Behavioral: Negative for hallucinations, other worsening agitation, SI, self-injury, or new decreased concentration All other system neg per pt    Objective:   Physical Exam BP 118/76   Pulse 87   Temp 98.2 F (36.8 C) (Oral)   Ht 5\' 2"  (1.575 m)   Wt 158 lb (71.7 kg)   SpO2 98%   BMI 28.90 kg/m  VS noted,  Constitutional: Pt is oriented to person, place, and time. Appears well-developed and well-nourished, in no significant distress and comfortable Head: Normocephalic and atraumatic  Eyes: Conjunctivae and EOM are normal. Pupils are equal, round, and reactive to light Right Ear: External ear normal without discharge Left Ear: External ear normal without discharge Nose: Nose without discharge or deformity Mouth/Throat: Oropharynx is without other ulcerations and moist  Neck: Normal range of motion. Neck supple. No JVD present. No tracheal deviation present or significant neck LA or mass Cardiovascular: Normal rate, regular rhythm, normal heart sounds and intact distal pulses.   Pulmonary/Chest: WOB normal and breath sounds without rales or wheezing  Abdominal: Soft. Bowel sounds  are normal. NT. No HSM  Musculoskeletal: Normal range of motion. Exhibits no edema Lymphadenopathy: Has no other cervical adenopathy.  Neurological: Pt is alert and oriented to person, place, and time. Pt has normal reflexes. No cranial nerve deficit. Motor grossly intact, Gait intact Skin: Skin is warm and dry. No rash noted or new ulcerations Psychiatric:  Has normal mood and affect. Behavior is normal without agitation No other exam findings Lab Results  Component Value Date   WBC 8.7 01/15/2019   HGB 13.3 01/15/2019   HCT 39.8 01/15/2019   PLT 243.0 01/15/2019   GLUCOSE 83 01/15/2019   CHOL 233 (H) 03/30/2018   TRIG 94.0 03/30/2018   HDL 52.10 03/30/2018   LDLDIRECT 206.8 06/25/2010   LDLCALC 162 (H) 03/30/2018   ALT 11 01/15/2019   AST 12 01/15/2019   NA 139 01/15/2019   K 3.8 01/15/2019   CL 104 01/15/2019   CREATININE 0.72 01/15/2019   BUN 6 01/15/2019   CO2 26 01/15/2019   TSH 1.32 03/30/2018       Assessment & Plan:

## 2019-05-20 ENCOUNTER — Encounter: Payer: Self-pay | Admitting: Internal Medicine

## 2019-05-20 NOTE — Assessment & Plan Note (Signed)

## 2019-05-20 NOTE — Assessment & Plan Note (Signed)
MRI declined by insurance, pt cancelled neuro eval

## 2019-05-20 NOTE — Assessment & Plan Note (Signed)
Mod uncontrolled, for crestor 20 qd

## 2019-05-20 NOTE — Assessment & Plan Note (Signed)
For lab f/u 

## 2019-06-05 ENCOUNTER — Telehealth: Payer: Self-pay | Admitting: Internal Medicine

## 2019-06-05 MED ORDER — AZITHROMYCIN 250 MG PO TABS: ORAL_TABLET | ORAL | 0 refills | Status: DC

## 2019-06-05 NOTE — Telephone Encounter (Signed)
Ok for zpack - done erx 

## 2019-06-05 NOTE — Telephone Encounter (Signed)
Patient called stating that she has re-occuring sinus infections. This happens twice a year. She asked if a prescription could be sent in. She does not wish to have an appointment because they "discuss the same thing every time". I advised her that an appointment would be needed but she did not wish to make one and would like something called in.  Please advise.

## 2019-06-05 NOTE — Addendum Note (Signed)
Addended by: Biagio Borg on: 06/05/2019 04:58 PM   Modules accepted: Orders

## 2019-07-27 ENCOUNTER — Ambulatory Visit (INDEPENDENT_AMBULATORY_CARE_PROVIDER_SITE_OTHER): Payer: 59 | Admitting: Internal Medicine

## 2019-07-27 ENCOUNTER — Encounter: Payer: Self-pay | Admitting: Internal Medicine

## 2019-07-27 ENCOUNTER — Other Ambulatory Visit: Payer: Self-pay

## 2019-07-27 DIAGNOSIS — F329 Major depressive disorder, single episode, unspecified: Secondary | ICD-10-CM

## 2019-07-27 DIAGNOSIS — I10 Essential (primary) hypertension: Secondary | ICD-10-CM | POA: Diagnosis not present

## 2019-07-27 DIAGNOSIS — F32A Depression, unspecified: Secondary | ICD-10-CM

## 2019-07-27 DIAGNOSIS — M542 Cervicalgia: Secondary | ICD-10-CM

## 2019-07-27 MED ORDER — PREDNISONE 10 MG PO TABS: ORAL_TABLET | ORAL | 0 refills | Status: DC

## 2019-07-27 MED ORDER — CYCLOBENZAPRINE HCL 5 MG PO TABS: 5.0000 mg | ORAL_TABLET | Freq: Three times a day (TID) | ORAL | 1 refills | Status: DC | PRN

## 2019-07-27 MED ORDER — TRAMADOL HCL 50 MG PO TABS: 50.0000 mg | ORAL_TABLET | Freq: Four times a day (QID) | ORAL | 0 refills | Status: DC | PRN

## 2019-07-27 MED ORDER — IBUPROFEN 800 MG PO TABS: 800.0000 mg | ORAL_TABLET | Freq: Three times a day (TID) | ORAL | 1 refills | Status: DC | PRN

## 2019-07-27 NOTE — Progress Notes (Signed)
Subjective:    Patient ID: Sandy Hanna, female    DOB: 02/27/1966, 53 y.o.   MRN: [id number]  HPI   Here to f/u as right handed, with c/o 1 wk acute onset moderate intermittent low mid and left post neck pain with radiation to the left upper back but also to distal LUE to below the elbow, but without numbness or weakness, or loss of left grip strength.  At first though it was a strain to the neck and bought a new pillow, but now help, nor helped with nsaid.  An OTC pain patch did help.  Pain overall seems better later in the day, and worse first thing in the am for some reason, but nothing else makes better or worse.  Pt denies chest pain, increased sob or doe, wheezing, orthopnea, PND, increased LE swelling, palpitations, dizziness or syncope.   Pt denies fever, wt loss, night sweats, loss of appetite, or other constitutional symptoms.  Denies worsening depressive symptoms, suicidal ideation, or panic Past Medical History:  Diagnosis Date  . Acute sinusitis, unspecified 12/30/2007  . ALLERGIC RHINITIS 12/15/2007  . ANEMIA-NOS 12/18/2010  . ANXIETY 12/15/2007  . BLEPHARITIS, RIGHT 04/03/2008  . CHEST PAIN 08/25/2010  . DEPRESSION 12/15/2007  . FATIGUE 08/25/2010  . GERD 12/15/2007  . HYPERLIPIDEMIA 07/03/2010  . HYPERSOMNIA 12/18/2010  . HYPERTENSION 06/27/2007  . Irritable bowel syndrome 04/28/2009  . ISCHEMIC COLITIS 04/28/2009  . Migraines 12/23/2008  . OTITIS MEDIA, BILATERAL 09/12/2008  . RECTAL BLEEDING 04/28/2009  . Vitamin D deficiency 03/23/2016   Past Surgical History:  Procedure Laterality Date  . BREAST BIOPSY  2005   Negative    reports that she has been smoking cigarettes. She has been smoking about 0.30 packs per day. She has never used smokeless tobacco. She reports that she does not drink alcohol or use drugs. family history includes Breast cancer in her mother; Diabetes in her mother and paternal grandmother; Hypertension in her mother; Ovarian cancer in her unknown relative.  Allergies  Allergen Reactions  . Amoxicillin-Pot Clavulanate     REACTION: nausea  . Clarithromycin     REACTION: Nausea  . Doxycycline     REACTION: severe fatigue per pt  . Lipitor [Atorvastatin Calcium] Other (See Comments)    Myalgias joint pain  . Lisinopril Other (See Comments)    Angioedema left upper and lower lids   Current Outpatient Medications on File Prior to Visit  Medication Sig Dispense Refill  . ALPRAZolam (XANAX) 0.25 MG tablet Take 1 tablet (0.25 mg total) by mouth 2 (two) times daily as needed for anxiety. 40 tablet 0  . amLODipine (NORVASC) 10 MG tablet Take 1 tablet (10 mg total) by mouth daily. 90 tablet 1  . aspirin 81 MG EC tablet Take 1 tablet (81 mg total) by mouth daily. 30 tablet 5  . azithromycin (ZITHROMAX) 250 MG tablet 2 tabs po qd x 1 day; 1 tablet per day x 4 days; 6 tablet 0  . clotrimazole-betamethasone (LOTRISONE) cream Use as directed twice per day as needed 15 g 1  . dicyclomine (BENTYL) 20 MG tablet Take 1 tablet (20 mg total) by mouth 3 (three) times daily as needed for spasms. 90 tablet 3  . erythromycin ophthalmic ointment Place 1 application into both eyes 4 (four) times daily. 3.5 g 1  . fluticasone (FLONASE) 50 MCG/ACT nasal spray Place 2 sprays into both nostrils daily. 16 g 6  . meclizine (ANTIVERT) 25 MG tablet Take 1 tablet (  25 mg total) by mouth 3 (three) times daily as needed for dizziness. 30 tablet 1  . ondansetron (ZOFRAN) 4 MG tablet Take 1 tablet (4 mg total) by mouth every 8 (eight) hours as needed for nausea or vomiting. 30 tablet 0  . pantoprazole (PROTONIX) 40 MG tablet Take 1 tablet (40 mg total) by mouth daily. 90 tablet 1  . rosuvastatin (CRESTOR) 20 MG tablet Take 1 tablet (20 mg total) by mouth every other day. 45 tablet 3   No current facility-administered medications on file prior to visit.    Review of Systems  Constitutional: Negative for other unusual diaphoresis or sweats HENT: Negative for ear discharge or  swelling Eyes: Negative for other worsening visual disturbances Respiratory: Negative for stridor or other swelling  Gastrointestinal: Negative for worsening distension or other blood Genitourinary: Negative for retention or other urinary change Musculoskeletal: Negative for other MSK pain or swelling Skin: Negative for color change or other new lesions Neurological: Negative for worsening tremors and other numbness  Psychiatric/Behavioral: Negative for worsening agitation or other fatigue All other system neg per pt    Objective:   Physical Exam BP 116/76   Pulse 88   Temp 98.5 F (36.9 C) (Oral)   Ht 5\' 2"  (1.575 m)   Wt 161 lb (73 kg)   SpO2 98%   BMI 29.45 kg/m  VS noted,  Constitutional: Pt appears in NAD HENT: Head: NCAT.  Right Ear: External ear normal.  Left Ear: External ear normal.  Eyes: . Pupils are equal, round, and reactive to light. Conjunctivae and EOM are normal Nose: without d/c or deformity Neck: Neck supple. Gross normal ROM, has lowest cervical spine tender in the midline without swelling, and tender to left trapezoid as well Cardiovascular: Normal rate and regular rhythm.   Pulmonary/Chest: Effort normal and breath sounds without rales or wheezing.  Neurological: Pt is alert. At baseline orientation, motor grossly intact Skin: Skin is warm. No rashes, other new lesions, no LE edema Psychiatric: Pt behavior is normal without agitation  No other exam findings Lab Results  Component Value Date   WBC 8.7 01/15/2019   HGB 13.3 01/15/2019   HCT 39.8 01/15/2019   PLT 243.0 01/15/2019   GLUCOSE 83 01/15/2019   CHOL 233 (H) 03/30/2018   TRIG 94.0 03/30/2018   HDL 52.10 03/30/2018   LDLDIRECT 206.8 06/25/2010   LDLCALC 162 (H) 03/30/2018   ALT 11 01/15/2019   AST 12 01/15/2019   NA 139 01/15/2019   K 3.8 01/15/2019   CL 104 01/15/2019   CREATININE 0.72 01/15/2019   BUN 6 01/15/2019   CO2 26 01/15/2019   TSH 1.32 03/30/2018       Assessment &  Plan:

## 2019-07-27 NOTE — Patient Instructions (Addendum)
Please take all new medication as prescribed - the tramadol, anti-inflammatory, muscle relaxer and prednisone  Please continue all other medications as before, and refills have been done if requested.  Please have the pharmacy call with any other refills you may need.  Please continue your efforts at being more active, low cholesterol diet, and weight control.  Please keep your appointments with your specialists as you may have planned

## 2019-07-28 ENCOUNTER — Encounter: Payer: Self-pay | Admitting: Internal Medicine

## 2019-07-28 DIAGNOSIS — M542 Cervicalgia: Secondary | ICD-10-CM | POA: Insufficient documentation

## 2019-07-28 NOTE — Assessment & Plan Note (Signed)
Exam c/w likely underlying cspine djd or ddd with intermittent LUE neuritic pain, overall mild to mod, for pain control, muscle relaxer, and predpac asd trial, consider MRI for persistent or worsening pain

## 2019-07-28 NOTE — Assessment & Plan Note (Signed)
stable overall by history and exam, recent data reviewed with pt, and pt to continue medical treatment as before,  to f/u any worsening symptoms or concerns  

## 2019-08-01 ENCOUNTER — Telehealth: Payer: Self-pay | Admitting: Internal Medicine

## 2019-08-01 DIAGNOSIS — M5412 Radiculopathy, cervical region: Secondary | ICD-10-CM

## 2019-08-01 NOTE — Addendum Note (Signed)
Addended by: Biagio Borg on: 08/01/2019 08:09 PM   Modules accepted: Orders

## 2019-08-01 NOTE — Telephone Encounter (Signed)
Mri c spine is ordered

## 2019-08-01 NOTE — Telephone Encounter (Signed)
Pt reporting that her pain has gotten worse and is asking about doing mri.  Pt is having hard time with her arm.   cb is [phone].

## 2019-08-03 ENCOUNTER — Telehealth: Payer: Self-pay

## 2019-08-03 NOTE — Telephone Encounter (Signed)
Copied from [place] [id number]. Topic: General - Other >> Aug 03, 2019  8:54 AM Sandy Hanna wrote: Reason for CRM: Patient called to inform Sandy Hanna that all the medication that was prescribed on 07/27/2019 is not doing anything to help her. She states that she is still in the same amount of pain as when she came into the office to be seen. Per patient this pain is affecting her work she can not sit, stand for extended periods also not sleeping well at nights. Patient is asking for additional testing to find out the cause of the pain so that it can be treated accordingly. Please contact patient at Ph# [phone]

## 2019-08-03 NOTE — Telephone Encounter (Signed)
Noted  

## 2019-08-03 NOTE — Telephone Encounter (Signed)
Already referred for MRI neck - hopefully she will hear soon

## 2019-08-29 ENCOUNTER — Other Ambulatory Visit: Payer: Self-pay | Admitting: Internal Medicine

## 2019-08-30 ENCOUNTER — Ambulatory Visit: Payer: Self-pay

## 2019-08-30 ENCOUNTER — Other Ambulatory Visit: Payer: Self-pay

## 2019-08-30 ENCOUNTER — Encounter: Payer: Self-pay | Admitting: Internal Medicine

## 2019-08-30 ENCOUNTER — Ambulatory Visit: Payer: 59 | Admitting: Internal Medicine

## 2019-08-30 ENCOUNTER — Ambulatory Visit (INDEPENDENT_AMBULATORY_CARE_PROVIDER_SITE_OTHER): Payer: 59 | Admitting: Internal Medicine

## 2019-08-30 VITALS — BP 130/70 | HR 100 | Temp 98.1°F | Resp 16 | Ht 62.0 in | Wt 166.0 lb

## 2019-08-30 DIAGNOSIS — H00025 Hordeolum internum left lower eyelid: Secondary | ICD-10-CM | POA: Diagnosis not present

## 2019-08-30 MED ORDER — FLUCONAZOLE 150 MG PO TABS: 150.0000 mg | ORAL_TABLET | Freq: Once | ORAL | 1 refills | Status: AC

## 2019-08-30 MED ORDER — SULFAMETHOXAZOLE-TRIMETHOPRIM 800-160 MG PO TABS: 1.0000 | ORAL_TABLET | Freq: Two times a day (BID) | ORAL | 0 refills | Status: AC

## 2019-08-30 NOTE — Telephone Encounter (Signed)
Pt. Reports Dr. Jenny Reichmann prescribed Bactrim for her today for an "eye infection." She later remembered that Bactrim caused worsening of her diarrhea as related to irritable bowel syndrome. Requests something else be called in. Please advise pt.  Answer Assessment - Initial Assessment Questions 1.   NAME of MEDICATION: "What medicine are you calling about?"     Bactrim 2.   QUESTION: "What is your question?"     Caused more diarrhea with IBS 3.   PRESCRIBING HCP: "Who prescribed it?" Reason: if prescribed by specialist, call should be referred to that group.     Dr. Ronnald Ramp 4. SYMPTOMS: "Do you have any symptoms?"     Eye infection 5. SEVERITY: If symptoms are present, ask "Are they mild, moderate or severe?"     Had severe 6.  PREGNANCY:  "Is there any chance that you are pregnant?" "When was your last menstrual period?"     no  Protocols used: MEDICATION QUESTION CALL-A-AH

## 2019-08-30 NOTE — Patient Instructions (Signed)

## 2019-08-30 NOTE — Progress Notes (Signed)
Subjective:  Patient ID: Sandy Hanna, female    DOB: Dec 13, 1965  Age: 53 y.o. MRN: [id number]  CC: Stye   HPI Sandy Hanna presents for concerns about a 3-day history of pain and swelling in her left medial lower eyelid.  Outpatient Medications Prior to Visit  Medication Sig Dispense Refill  . ALPRAZolam (XANAX) 0.25 MG tablet Take 1 tablet (0.25 mg total) by mouth 2 (two) times daily as needed for anxiety. 40 tablet 0  . amLODipine (NORVASC) 10 MG tablet Take 1 tablet (10 mg total) by mouth daily. 90 tablet 1  . aspirin 81 MG EC tablet Take 1 tablet (81 mg total) by mouth daily. 30 tablet 5  . cyclobenzaprine (FLEXERIL) 5 MG tablet Take 1 tablet (5 mg total) by mouth 3 (three) times daily as needed for muscle spasms. 30 tablet 1  . fluticasone (FLONASE) 50 MCG/ACT nasal spray Place 2 sprays into both nostrils daily. 16 g 6  . ibuprofen (ADVIL) 800 MG tablet Take 1 tablet (800 mg total) by mouth every 8 (eight) hours as needed. 40 tablet 1  . pantoprazole (PROTONIX) 40 MG tablet Take 1 tablet (40 mg total) by mouth daily. 90 tablet 1  . traMADol (ULTRAM) 50 MG tablet Take 1 tablet (50 mg total) by mouth every 6 (six) hours as needed. 30 tablet 0  . dicyclomine (BENTYL) 20 MG tablet Take 1 tablet (20 mg total) by mouth 3 (three) times daily as needed for spasms. (Patient not taking: Reported on 08/30/2019) 90 tablet 3  . rosuvastatin (CRESTOR) 20 MG tablet Take 1 tablet (20 mg total) by mouth every other day. (Patient not taking: Reported on 08/30/2019) 45 tablet 3  . azithromycin (ZITHROMAX) 250 MG tablet 2 tabs po qd x 1 day; 1 tablet per day x 4 days; 6 tablet 0  . clotrimazole-betamethasone (LOTRISONE) cream Use as directed twice per day as needed 15 g 1  . erythromycin ophthalmic ointment Place 1 application into both eyes 4 (four) times daily. 3.5 g 1  . meclizine (ANTIVERT) 25 MG tablet Take 1 tablet (25 mg total) by mouth 3 (three) times daily as needed for dizziness. 30 tablet  1  . ondansetron (ZOFRAN) 4 MG tablet Take 1 tablet (4 mg total) by mouth every 8 (eight) hours as needed for nausea or vomiting. 30 tablet 0  . predniSONE (DELTASONE) 10 MG tablet 3 tabs by mouth per day for 3 days,2tabs per day for 3 days,1tab per day for 3 days 18 tablet 0   No facility-administered medications prior to visit.     ROS Review of Systems  Constitutional: Negative.  Negative for chills and fever.  HENT: Negative.   Eyes: Positive for discharge. Negative for photophobia, pain, redness and visual disturbance.  Respiratory: Negative for cough, chest tightness, shortness of breath and wheezing.   Cardiovascular: Negative for chest pain and leg swelling.  Gastrointestinal: Negative for abdominal pain, diarrhea and nausea.  Endocrine: Negative.   Genitourinary: Negative.   Musculoskeletal: Negative.  Negative for arthralgias and myalgias.  Skin: Negative for rash.  Neurological: Negative.  Negative for weakness and headaches.  Hematological: Negative for adenopathy. Does not bruise/bleed easily.  Psychiatric/Behavioral: Negative.     Objective:  BP 130/70 (BP Location: Left Arm, Patient Position: Sitting, Cuff Size: Normal)   Pulse 100   Temp 98.1 F (36.7 C) (Oral)   Resp 16   Ht 5\' 2"  (1.575 m)   Wt 166 lb (75.3 kg)  SpO2 99%   BMI 30.36 kg/m   BP Readings from Last 3 Encounters:  08/30/19 130/70  07/27/19 116/76  05/18/19 118/76    Wt Readings from Last 3 Encounters:  08/30/19 166 lb (75.3 kg)  07/27/19 161 lb (73 kg)  05/18/19 158 lb (71.7 kg)    Physical Exam Vitals signs reviewed.  Constitutional:      Appearance: Normal appearance.  HENT:     Nose: Nose normal.     Mouth/Throat:     Mouth: Mucous membranes are moist.  Eyes:     General: No scleral icterus.       Right eye: No discharge.        Left eye: No discharge.     Extraocular Movements: Extraocular movements intact.     Conjunctiva/sclera: Conjunctivae normal.     Right eye:  Right conjunctiva is not injected. No chemosis, exudate or hemorrhage.    Left eye: Left conjunctiva is not injected. No chemosis, exudate or hemorrhage.    Pupils: Pupils are equal, round, and reactive to light.   Neck:     Musculoskeletal: Neck supple.  Cardiovascular:     Rate and Rhythm: Normal rate and regular rhythm.  Pulmonary:     Effort: Pulmonary effort is normal.     Breath sounds: No stridor. No wheezing, rhonchi or rales.  Abdominal:     General: Abdomen is flat. Bowel sounds are normal. There is no distension.     Tenderness: There is no guarding.  Lymphadenopathy:     Cervical: No cervical adenopathy.  Skin:    General: Skin is warm and dry.     Findings: No rash.  Neurological:     General: No focal deficit present.     Mental Status: She is alert.  Psychiatric:        Mood and Affect: Mood normal.        Behavior: Behavior normal.     Lab Results  Component Value Date   WBC 8.7 01/15/2019   HGB 13.3 01/15/2019   HCT 39.8 01/15/2019   PLT 243.0 01/15/2019   GLUCOSE 83 01/15/2019   CHOL 233 (H) 03/30/2018   TRIG 94.0 03/30/2018   HDL 52.10 03/30/2018   LDLDIRECT 206.8 06/25/2010   LDLCALC 162 (H) 03/30/2018   ALT 11 01/15/2019   AST 12 01/15/2019   NA 139 01/15/2019   K 3.8 01/15/2019   CL 104 01/15/2019   CREATININE 0.72 01/15/2019   BUN 6 01/15/2019   CO2 26 01/15/2019   TSH 1.32 03/30/2018    Dg Chest 2 View  Result Date: 01/15/2019 CLINICAL DATA:  Shortness of breath on exertion for the past week. EXAM: CHEST - 2 VIEW COMPARISON:  Chest x-ray dated June 27, 2018. FINDINGS: The heart size and mediastinal contours are within normal limits. Both lungs are clear. The visualized skeletal structures are unremarkable. IMPRESSION: No active cardiopulmonary disease. Electronically Signed   By: Titus Dubin M.D.   On: 01/15/2019 17:15    Assessment & Plan:   Sandy Hanna was seen today for stye.  Diagnoses and all orders for this visit:  Hordeolum  internum of left lower eyelid -     sulfamethoxazole-trimethoprim (BACTRIM DS) 800-160 MG tablet; Take 1 tablet by mouth 2 (two) times daily for 10 days. -     Ambulatory referral to Ophthalmology -     fluconazole (DIFLUCAN) 150 MG tablet; Take 1 tablet (150 mg total) by mouth once for 1 dose.   I  have discontinued Sandy Hanna's clotrimazole-betamethasone, erythromycin, ondansetron, meclizine, azithromycin, and predniSONE. I am also having her start on sulfamethoxazole-trimethoprim and fluconazole. Additionally, I am having her maintain her aspirin, dicyclomine, fluticasone, ALPRAZolam, amLODipine, pantoprazole, rosuvastatin, traMADol, cyclobenzaprine, and ibuprofen.  Meds ordered this encounter  Medications  . sulfamethoxazole-trimethoprim (BACTRIM DS) 800-160 MG tablet    Sig: Take 1 tablet by mouth 2 (two) times daily for 10 days.    Dispense:  20 tablet    Refill:  0  . fluconazole (DIFLUCAN) 150 MG tablet    Sig: Take 1 tablet (150 mg total) by mouth once for 1 dose.    Dispense:  1 tablet    Refill:  1     Follow-up: Return if symptoms worsen or fail to improve.  Scarlette Calico, MD

## 2019-08-31 ENCOUNTER — Encounter: Payer: Self-pay | Admitting: Family Medicine

## 2019-08-31 ENCOUNTER — Ambulatory Visit (INDEPENDENT_AMBULATORY_CARE_PROVIDER_SITE_OTHER): Payer: 59 | Admitting: Family Medicine

## 2019-08-31 ENCOUNTER — Other Ambulatory Visit: Payer: Self-pay

## 2019-08-31 DIAGNOSIS — H04302 Unspecified dacryocystitis of left lacrimal passage: Secondary | ICD-10-CM | POA: Diagnosis not present

## 2019-08-31 MED ORDER — AZITHROMYCIN 250 MG PO TABS: ORAL_TABLET | ORAL | 0 refills | Status: DC

## 2019-08-31 NOTE — Progress Notes (Signed)
Virtual Visit via Video Note  I connected with Sandy Hanna on 08/31/19 at  4:00 PM EDT by a video enabled telemedicine application 2/2 [id number] pandemic and verified that I am speaking with the correct person using two identifiers.  Location patient: home Location provider:work or home office Persons participating in the virtual visit: patient, provider  I discussed the limitations of evaluation and management by telemedicine and the availability of in person appointments. The patient expressed understanding and agreed to proceed.   HPI: Pt with infection of L eye.  Pt typically seen by Dr. Jenny Reichmann at [facility].  Pt states she was seen by Dr. Ronnald Ramp yesterday and started on Bactrim.  Pt has IBS-D, but the abx made it worse.  Pt states she gets this infection of her eye twice a yr.  She states the only thing that make it better is a Zpak.  Pt has seen the eye doctor in the past, but states they only gave her something for inflammation.  Pt has been using warm compresses and steam to help the pain.  Pt states her L eye and face are hurting from the infection.  Pt moved her Ophtho appointment back a few wks as she needs to budget for the $70 copay.    Pt has allergies to several medications.  Pt states her stomach is sensitive to abx but she can take Azithromycin.   ROS: See pertinent positives and negatives per HPI.  Past Medical History:  Diagnosis Date  . Acute sinusitis, unspecified 12/30/2007  . ALLERGIC RHINITIS 12/15/2007  . ANEMIA-NOS 12/18/2010  . ANXIETY 12/15/2007  . BLEPHARITIS, RIGHT 04/03/2008  . CHEST PAIN 08/25/2010  . DEPRESSION 12/15/2007  . FATIGUE 08/25/2010  . GERD 12/15/2007  . HYPERLIPIDEMIA 07/03/2010  . HYPERSOMNIA 12/18/2010  . HYPERTENSION 06/27/2007  . Irritable bowel syndrome 04/28/2009  . ISCHEMIC COLITIS 04/28/2009  . Migraines 12/23/2008  . OTITIS MEDIA, BILATERAL 09/12/2008  . RECTAL BLEEDING 04/28/2009  . Vitamin D deficiency 03/23/2016    Past Surgical History:  Procedure  Laterality Date  . BREAST BIOPSY  2005   Negative    Family History  Problem Relation Age of Onset  . Breast cancer Mother        age 24-65  . Hypertension Mother   . Diabetes Mother   . Diabetes Paternal Grandmother   . Ovarian cancer Unknown        Aunt     Current Outpatient Medications:  .  ALPRAZolam (XANAX) 0.25 MG tablet, Take 1 tablet (0.25 mg total) by mouth 2 (two) times daily as needed for anxiety., Disp: 40 tablet, Rfl: 0 .  amLODipine (NORVASC) 10 MG tablet, Take 1 tablet (10 mg total) by mouth daily., Disp: 90 tablet, Rfl: 1 .  aspirin 81 MG EC tablet, Take 1 tablet (81 mg total) by mouth daily., Disp: 30 tablet, Rfl: 5 .  cyclobenzaprine (FLEXERIL) 5 MG tablet, Take 1 tablet (5 mg total) by mouth 3 (three) times daily as needed for muscle spasms., Disp: 30 tablet, Rfl: 1 .  dicyclomine (BENTYL) 20 MG tablet, Take 1 tablet (20 mg total) by mouth 3 (three) times daily as needed for spasms. (Patient not taking: Reported on 08/30/2019), Disp: 90 tablet, Rfl: 3 .  fluticasone (FLONASE) 50 MCG/ACT nasal spray, Place 2 sprays into both nostrils daily., Disp: 16 g, Rfl: 6 .  ibuprofen (ADVIL) 800 MG tablet, Take 1 tablet (800 mg total) by mouth every 8 (eight) hours as needed., Disp: 40 tablet, Rfl:  1 .  pantoprazole (PROTONIX) 40 MG tablet, Take 1 tablet (40 mg total) by mouth daily., Disp: 90 tablet, Rfl: 1 .  rosuvastatin (CRESTOR) 20 MG tablet, Take 1 tablet (20 mg total) by mouth every other day. (Patient not taking: Reported on 08/30/2019), Disp: 45 tablet, Rfl: 3 .  sulfamethoxazole-trimethoprim (BACTRIM DS) 800-160 MG tablet, Take 1 tablet by mouth 2 (two) times daily for 10 days., Disp: 20 tablet, Rfl: 0 .  traMADol (ULTRAM) 50 MG tablet, Take 1 tablet (50 mg total) by mouth every 6 (six) hours as needed., Disp: 30 tablet, Rfl: 0  EXAM:  VITALS per patient if applicable:  GENERAL: alert, oriented, appears well and in no acute distress  HEENT: atraumatic, conjunctiva  clear, L eye tearing.  No erythema or edema noted.  no obvious abnormalities on inspection of external nose and ears  NECK: normal movements of the head and neck  LUNGS: on inspection no signs of respiratory distress, breathing rate appears normal, no obvious gross SOB, gasping or wheezing  CV: no obvious cyanosis  MS: moves all visible extremities without noticeable abnormality  PSYCH/NEURO: pleasant and cooperative, no obvious depression or anxiety, speech and thought processing grossly intact  ASSESSMENT AND PLAN:  Discussed the following assessment and plan:  Dacryocystitis of left lacrimal sac  -pt advised she ultimately needs to f/u with Ophthalmology sooner rather than later for this reoccurring issue.  May benefit from lacrima duct dilation. -discussed abx resistance. -allergies reviewed.  Clarithromycin causes nausea, however tolerates Azithromycin. -continue warm compresses.  NASAIDs or tylenol encouraged. - Plan: azithromycin (ZITHROMAX) 250 MG tablet -given precautions  F/u with pcp   I discussed the assessment and treatment plan with the patient. The patient was provided an opportunity to ask questions and all were answered. The patient agreed with the plan and demonstrated an understanding of the instructions.   The patient was advised to call back or seek an in-person evaluation if the symptoms worsen or if the condition fails to improve as anticipated.   Billie Ruddy, MD

## 2019-08-31 NOTE — Telephone Encounter (Signed)
Without seeing her eye I cannot state for sure if she needs the antibiotic. It looks like she was being treated for stye which generally does not need antibiotic treatment.

## 2019-08-31 NOTE — Telephone Encounter (Signed)
Contacted pt and informed of Dr. Nathanial Millman response. Pt wanted to know no provider in the office would give her a zpak, she stated that she always gets a zpak for this issure twice a year. I empathized with patient and stated that Dr. Jenny Reichmann would not be back in the office until Tuesday. Pt stated that she would come back in and see another provider. She also stated that the pain in her eye is so excruciating and that the zpak is the only thing that will help. I informed pt that I only had 2 providers in the office and offered her an appointment at another location. [facility] did have an opening. Pt agreed. Appt has been scheduled. I wish pt luck and hoped that she felt better.

## 2019-08-31 NOTE — Telephone Encounter (Signed)
Is there another abx that pt can take? Pt does have several allergies to abx. Please advise if there OTC remedies that can be taken while pt takes the Bactrim?

## 2019-09-04 ENCOUNTER — Telehealth: Payer: Self-pay

## 2019-09-04 NOTE — Telephone Encounter (Signed)
Copied from [place] [id number]. Topic: Referral - Request for Referral >> Sep 04, 2019 12:36 PM Rayann Heman wrote: Has patient seen PCP for this complaint?yes Referral for which specialty: ENT  Preferred provider/office: none  Reason for referral: pt went to the eye doctor and the doctor states that tear duct is not infected. Eye doctor suggested that it may be sinuses clogged.

## 2019-09-05 NOTE — Telephone Encounter (Signed)
Ok to take OTC allegra or zyrtec in additioin to the nasal steroid

## 2019-09-05 NOTE — Telephone Encounter (Signed)
Called pt, LVM.   

## 2019-11-21 ENCOUNTER — Encounter (INDEPENDENT_AMBULATORY_CARE_PROVIDER_SITE_OTHER): Payer: Self-pay | Admitting: Otolaryngology

## 2019-11-21 ENCOUNTER — Other Ambulatory Visit: Payer: Self-pay | Admitting: Internal Medicine

## 2019-11-21 ENCOUNTER — Ambulatory Visit (INDEPENDENT_AMBULATORY_CARE_PROVIDER_SITE_OTHER): Payer: 59 | Admitting: Otolaryngology

## 2019-11-21 ENCOUNTER — Other Ambulatory Visit: Payer: Self-pay

## 2019-11-21 VITALS — Temp 97.7°F

## 2019-11-21 DIAGNOSIS — J310 Chronic rhinitis: Secondary | ICD-10-CM

## 2019-11-21 DIAGNOSIS — H00025 Hordeolum internum left lower eyelid: Secondary | ICD-10-CM

## 2019-11-21 DIAGNOSIS — J329 Chronic sinusitis, unspecified: Secondary | ICD-10-CM

## 2019-11-21 NOTE — Progress Notes (Addendum)
HPI: Sandy Hanna is a 53 y.o. female who presents for evaluation of sinus symptoms.  She complains of pain discomfort along the medial aspect of the left.  The pain radiates up above the left.  She also has had tearing in the left eye.  She thought it was related to her eye and initially saw ophthalmology.  They placed her on steroid eyedrops that seem to help temporarily.  But now she has recurrent symptoms and she was told that her eye was normal.  She thought it might be related to her sinuses. She has had the symptoms intermittently for the past couple years. I had previously seen her 10 years ago and had sinus x-rays that were clear. She denies any fever or yellow-green discharge from her nose.  She has mild nasal congestion and mostly just clear mucus discharge.  Past Medical History:  Diagnosis Date  . Acute sinusitis, unspecified 12/30/2007  . ALLERGIC RHINITIS 12/15/2007  . ANEMIA-NOS 12/18/2010  . ANXIETY 12/15/2007  . BLEPHARITIS, RIGHT 04/03/2008  . CHEST PAIN 08/25/2010  . DEPRESSION 12/15/2007  . FATIGUE 08/25/2010  . GERD 12/15/2007  . HYPERLIPIDEMIA 07/03/2010  . HYPERSOMNIA 12/18/2010  . HYPERTENSION 06/27/2007  . Irritable bowel syndrome 04/28/2009  . ISCHEMIC COLITIS 04/28/2009  . Migraines 12/23/2008  . OTITIS MEDIA, BILATERAL 09/12/2008  . RECTAL BLEEDING 04/28/2009  . Vitamin D deficiency 03/23/2016   Past Surgical History:  Procedure Laterality Date  . BREAST BIOPSY  2005   Negative   Social History   Socioeconomic History  . Marital status: Single    Spouse name: Not on file  . Number of children: 1  . Years of education: Not on file  . Highest education level: Not on file  Occupational History  . 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 Smoking status: Current Every Day Smoker    Packs/day: 0.30    Years: 30.00    Pack years: 9.00    Types: Cigarettes    Start date: 86  . Smokeless tobacco: Never Used  .  Tobacco comment: 6 cigarettes a day  Substance and Sexual Activity  . Alcohol use: No  . Drug use: No  . Sexual activity: Not on file  Other Topics Concern  . Not on file  Social History Narrative   Patient does not get regular exercise   Daily Caffeine- use 2 cups daily   Social Determinants of Health   Financial Resource Strain:   . Difficulty of Paying Living Expenses: Not on file  Food Insecurity:   . Worried About Charity fundraiser in the Last Year: Not on file  . Ran Out of Food in the Last Year: Not on file  Transportation Needs:   . Lack of Transportation (Medical): Not on file  . Lack of Transportation (Non-Medical): Not on file  Physical Activity:   . Days of Exercise per Week: Not on file  . Minutes of Exercise per Session: Not on file  Stress:   . Feeling of Stress : Not on file  Social Connections:   . Frequency of Communication with Friends and Family: Not on file  . Frequency of Social Gatherings with Friends and Family: Not on file  . Attends Religious Services: Not on file  . Active Member of Clubs or Organizations: Not on file  . Attends Archivist Meetings: Not on file  . Marital Status: Not on file   Family  History  Problem Relation Age of Onset  . Breast cancer Mother        age 69-65  . Hypertension Mother   . Diabetes Mother   . Diabetes Paternal Grandmother   . Ovarian cancer Unknown        Aunt   Allergies  Allergen Reactions  . Amoxicillin-Pot Clavulanate     REACTION: nausea  . Clarithromycin     REACTION: Nausea  . Doxycycline     REACTION: severe fatigue per pt  . Lipitor [Atorvastatin Calcium] Other (See Comments)    Myalgias joint pain  . Lisinopril Other (See Comments)    Angioedema left upper and lower lids   Prior to Admission medications   Medication Sig Start Date End Date Taking? Authorizing Provider  ALPRAZolam (XANAX) 0.25 MG tablet Take 1 tablet (0.25 mg total) by mouth 2 (two) times daily as needed for  anxiety. 02/16/19  Yes Biagio Borg, MD  amLODipine (NORVASC) 10 MG tablet Take 1 tablet (10 mg total) by mouth daily. 05/18/19  Yes Biagio Borg, MD  aspirin 81 MG EC tablet Take 1 tablet (81 mg total) by mouth daily. 06/28/16  Yes Biagio Borg, MD  azithromycin (ZITHROMAX) 250 MG tablet Take two tabs on day one.  Then take 1 tab daily. 08/31/19  Yes Billie Ruddy, MD  cyclobenzaprine (FLEXERIL) 5 MG tablet Take 1 tablet (5 mg total) by mouth 3 (three) times daily as needed for muscle spasms. 07/27/19  Yes Biagio Borg, MD  dicyclomine (BENTYL) 20 MG tablet Take 1 tablet (20 mg total) by mouth 3 (three) times daily as needed for spasms. 03/30/18  Yes Biagio Borg, MD  fluticasone [facility]) 50 MCG/ACT nasal spray Place 2 sprays into both nostrils daily. 01/15/19  Yes Marrian Salvage, FNP  ibuprofen (ADVIL) 800 MG tablet Take 1 tablet (800 mg total) by mouth every 8 (eight) hours as needed. 07/27/19  Yes Biagio Borg, MD  pantoprazole (PROTONIX) 40 MG tablet Take 1 tablet (40 mg total) by mouth daily. 05/18/19  Yes Biagio Borg, MD  rosuvastatin (CRESTOR) 20 MG tablet Take 1 tablet (20 mg total) by mouth every other day. 05/18/19  Yes Biagio Borg, MD  traMADol (ULTRAM) 50 MG tablet Take 1 tablet (50 mg total) by mouth every 6 (six) hours as needed. 07/27/19  Yes Biagio Borg, MD     Positive ROS: Is otherwise negative  All other systems have been reviewed and were otherwise negative with the exception of those mentioned in the HPI and as above.  Physical Exam: Constitutional: Alert, well-appearing, no acute distress Ears: External ears without lesions or tenderness. Ear canals are clear bilaterally with intact, clear TMs.  Nasal: External nose without lesions. Septum with minimal deformity.  Moderate rhinitis with clear mucus discharge.. Clear nasal passages.  Both middle meatus regions were clear with no obvious mucopurulent discharge from the left middle meatus. Oral: Lips and gums without  lesions. Tongue and palate mucosa without lesions. Posterior oropharynx clear. Neck: No palpable adenopathy or masses Respiratory: Breathing comfortably  Skin: No facial/neck lesions or rash noted. She has pain and discomfort on palpation over the left nasolacrimal sac region.  No purulent discharge noted from the left nasolacrimal duct.  No swelling or erythema noted.  Procedures  Assessment: Left periorbital pain.  Questionable sinus disease versus nasal lacrimal disease.  Plan: Place her on Nasacort 2 sprays each nostril at night..  Also prescribed a Z-Pak  although it did not help in October. We will go ahead and schedule a CT scan of the sinuses to evaluate for any sinus disease.  She will call our office following the CT scan of the sinuses  Radene Journey, MD

## 2019-11-30 ENCOUNTER — Inpatient Hospital Stay: Admission: RE | Admit: 2019-11-30 | Payer: 59 | Source: Ambulatory Visit

## 2019-12-05 ENCOUNTER — Ambulatory Visit
Admission: RE | Admit: 2019-12-05 | Discharge: 2019-12-05 | Disposition: A | Discharge status: Home / Self Care | Payer: 59 | Source: Ambulatory Visit | Attending: Otolaryngology | Admitting: Otolaryngology

## 2019-12-05 DIAGNOSIS — J329 Chronic sinusitis, unspecified: Secondary | ICD-10-CM

## 2019-12-10 ENCOUNTER — Other Ambulatory Visit: Payer: Self-pay | Admitting: Internal Medicine

## 2019-12-12 ENCOUNTER — Telehealth (INDEPENDENT_AMBULATORY_CARE_PROVIDER_SITE_OTHER): Payer: Self-pay | Admitting: Otolaryngology

## 2019-12-12 NOTE — Telephone Encounter (Signed)
Called patient about results of recent CT scan. This showed clear paranasal sinuses with small underdeveloped frontal sinuses. She is having no further pain or discomfort.

## 2020-01-07 ENCOUNTER — Telehealth (INDEPENDENT_AMBULATORY_CARE_PROVIDER_SITE_OTHER): Payer: Self-pay

## 2020-02-08 ENCOUNTER — Other Ambulatory Visit: Payer: Self-pay | Admitting: Internal Medicine

## 2020-02-08 NOTE — Telephone Encounter (Signed)
Please refill as per office routine med refill policy (all routine meds refilled for 3 mo or monthly per pt preference up to one year from last visit, then month to month grace period for 3 mo, then further med refills will have to be denied)  

## 2020-05-04 ENCOUNTER — Other Ambulatory Visit: Payer: Self-pay | Admitting: Internal Medicine

## 2020-05-04 NOTE — Telephone Encounter (Signed)
Please refill as per office routine med refill policy (all routine meds refilled for 3 mo or monthly per pt preference up to one year from last visit, then month to month grace period for 3 mo, then further med refills will have to be denied)  

## 2020-05-19 ENCOUNTER — Other Ambulatory Visit: Payer: Self-pay | Admitting: Internal Medicine

## 2020-06-07 ENCOUNTER — Other Ambulatory Visit: Payer: Self-pay | Admitting: Internal Medicine

## 2020-06-07 NOTE — Telephone Encounter (Signed)
Please refill as per office routine med refill policy (all routine meds refilled for 3 mo or monthly per pt preference up to one year from last visit, then month to month grace period for 3 mo, then further med refills will have to be denied)  

## 2020-06-11 ENCOUNTER — Other Ambulatory Visit: Payer: Self-pay

## 2020-06-11 ENCOUNTER — Encounter: Payer: Self-pay | Admitting: Internal Medicine

## 2020-06-11 ENCOUNTER — Ambulatory Visit (INDEPENDENT_AMBULATORY_CARE_PROVIDER_SITE_OTHER): Payer: 59 | Admitting: Internal Medicine

## 2020-06-11 VITALS — BP 132/68 | HR 61 | Temp 98.6°F | Ht 62.0 in | Wt 165.0 lb

## 2020-06-11 DIAGNOSIS — E559 Vitamin D deficiency, unspecified: Secondary | ICD-10-CM

## 2020-06-11 DIAGNOSIS — Z0000 Encounter for general adult medical examination without abnormal findings: Secondary | ICD-10-CM

## 2020-06-11 DIAGNOSIS — I10 Essential (primary) hypertension: Secondary | ICD-10-CM

## 2020-06-11 DIAGNOSIS — E785 Hyperlipidemia, unspecified: Secondary | ICD-10-CM

## 2020-06-11 DIAGNOSIS — Z1159 Encounter for screening for other viral diseases: Secondary | ICD-10-CM | POA: Diagnosis not present

## 2020-06-11 DIAGNOSIS — F411 Generalized anxiety disorder: Secondary | ICD-10-CM

## 2020-06-11 MED ORDER — PANTOPRAZOLE SODIUM 40 MG PO TBEC: 40.0000 mg | DELAYED_RELEASE_TABLET | Freq: Every day | ORAL | 3 refills | Status: DC

## 2020-06-11 MED ORDER — ALPRAZOLAM 0.25 MG PO TABS: 0.2500 mg | ORAL_TABLET | Freq: Two times a day (BID) | ORAL | 0 refills | Status: DC | PRN

## 2020-06-11 MED ORDER — AMLODIPINE BESYLATE 10 MG PO TABS: 10.0000 mg | ORAL_TABLET | Freq: Every day | ORAL | 3 refills | Status: DC

## 2020-06-11 NOTE — Assessment & Plan Note (Signed)
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

## 2020-06-11 NOTE — Assessment & Plan Note (Signed)
Cont oral replacement 

## 2020-06-11 NOTE — Patient Instructions (Signed)

## 2020-06-11 NOTE — Assessment & Plan Note (Signed)
stable overall by history and exam, recent data reviewed with pt, and pt to continue medical treatment as before,  to f/u any worsening symptoms or concerns  

## 2020-06-11 NOTE — Assessment & Plan Note (Signed)
Declines further statin, cont low chol diet

## 2020-06-11 NOTE — Assessment & Plan Note (Signed)

## 2020-06-11 NOTE — Progress Notes (Signed)
Subjective:    Patient ID: Sandy Hanna, female    DOB: 10-22-1966, 54 y.o.   MRN: [id number]  HPI Here for wellness and f/u;  Overall doing ok;  Pt denies Chest pain, worsening SOB, DOE, wheezing, orthopnea, PND, worsening LE edema, palpitations, dizziness or syncope.  Pt denies neurological change such as new headache, facial or extremity weakness.  Pt denies polydipsia, polyuria, or low sugar symptoms. Pt states overall good compliance with treatment and medications, good tolerability, and has been trying to follow appropriate diet.  Pt denies worsening depressive symptoms, suicidal ideation or panic. No fever, night sweats, wt loss, loss of appetite, or other constitutional symptoms.  Pt states good ability with ADL's, has low fall risk, home safety reviewed and adequate, no other significant changes in hearing or vision, and only occasionally active with exercise. Wt Readings from Last 3 Encounters:  06/11/20 165 lb (74.8 kg)  08/30/19 166 lb (75.3 kg)  07/27/19 161 lb (73 kg)  Has been more anxious depressed working from home with little human contact, but not really wanitng to go back to office, and is doing well having groceries delivered, no covid infection, but now not really wanting to be around people, especially any large group.  Work wants her back but she is not wanting to do this in a large open office environment. Supposed to go back aug 2.  Has been looking for other work at home jobs. Declines meds for depression, no SI or HI  Has stopped her second statin due to development of bilateral shoulder pains, does not want take further. DId have PT for the neck pain and now improved.  Past Medical History:  Diagnosis Date  . Acute sinusitis, unspecified 12/30/2007  . ALLERGIC RHINITIS 12/15/2007  . ANEMIA-NOS 12/18/2010  . ANXIETY 12/15/2007  . BLEPHARITIS, RIGHT 04/03/2008  . CHEST PAIN 08/25/2010  . DEPRESSION 12/15/2007  . FATIGUE 08/25/2010  . GERD 12/15/2007  . HYPERLIPIDEMIA  07/03/2010  . HYPERSOMNIA 12/18/2010  . HYPERTENSION 06/27/2007  . Irritable bowel syndrome 04/28/2009  . ISCHEMIC COLITIS 04/28/2009  . Migraines 12/23/2008  . OTITIS MEDIA, BILATERAL 09/12/2008  . RECTAL BLEEDING 04/28/2009  . Vitamin D deficiency 03/23/2016   Past Surgical History:  Procedure Laterality Date  . BREAST BIOPSY  2005   Negative    reports that she has been smoking cigarettes. She started smoking about 31 years ago. She has a 9.00 pack-year smoking history. She has never used smokeless tobacco. She reports that she does not drink alcohol and does not use drugs. family history includes Breast cancer in her mother; Diabetes in her mother and paternal grandmother; Hypertension in her mother; Ovarian cancer in her unknown relative. Allergies  Allergen Reactions  . Amoxicillin-Pot Clavulanate     REACTION: nausea  . Clarithromycin     REACTION: Nausea  . Doxycycline     REACTION: severe fatigue per pt  . Lipitor [Atorvastatin Calcium] Other (See Comments)    Myalgias joint pain  . Lisinopril Other (See Comments)    Angioedema left upper and lower lids   Current Outpatient Medications on File Prior to Visit  Medication Sig Dispense Refill  . ALPRAZolam (XANAX) 0.25 MG tablet Take 1 tablet (0.25 mg total) by mouth 2 (two) times daily as needed for anxiety. 40 tablet 0  . amLODipine (NORVASC) 10 MG tablet Take 1 tablet (10 mg total) by mouth daily. Annual appt w/labs are due must see provider for future refills 30 tablet 0  .  aspirin 81 MG EC tablet Take 1 tablet (81 mg total) by mouth daily. 30 tablet 5  . azithromycin (ZITHROMAX) 250 MG tablet Take two tabs on day one.  Then take 1 tab daily. 6 tablet 0  . cyclobenzaprine (FLEXERIL) 5 MG tablet Take 1 tablet (5 mg total) by mouth 3 (three) times daily as needed for muscle spasms. 30 tablet 1  . dicyclomine (BENTYL) 20 MG tablet Take 1 tablet (20 mg total) by mouth 3 (three) times daily as needed for spasms. 90 tablet 3  .  fluticasone (FLONASE) 50 MCG/ACT nasal spray Place 2 sprays into both nostrils daily. 16 g 6  . ibuprofen (ADVIL) 800 MG tablet Take 1 tablet (800 mg total) by mouth every 8 (eight) hours as needed. 40 tablet 1  . pantoprazole (PROTONIX) 40 MG tablet TAKE 1 TABLET BY MOUTH EVERY DAY 90 tablet 0  . rosuvastatin (CRESTOR) 20 MG tablet Take 1 tablet (20 mg total) by mouth every other day. 45 tablet 3  . traMADol (ULTRAM) 50 MG tablet Take 1 tablet (50 mg total) by mouth every 6 (six) hours as needed. 30 tablet 0   No current facility-administered medications on file prior to visit.   Review of Systems All otherwise neg per pt     Objective:   Physical Exam BP 132/68 (BP Location: Left Arm, Patient Position: Sitting, Cuff Size: Large)   Pulse 61   Temp 98.6 F (37 C) (Oral)   Ht 5\' 2"  (1.575 m)   Wt 165 lb (74.8 kg)   SpO2 98%   BMI 30.18 kg/m  VS noted,  Constitutional: Pt appears in NAD HENT: Head: NCAT.  Right Ear: External ear normal.  Left Ear: External ear normal.  Eyes: . Pupils are equal, round, and reactive to light. Conjunctivae and EOM are normal Nose: without d/c or deformity Neck: Neck supple. Gross normal ROM Cardiovascular: Normal rate and regular rhythm.   Pulmonary/Chest: Effort normal and breath sounds without rales or wheezing.  Abd:  Soft, NT, ND, + BS, no organomegaly Neurological: Pt is alert. At baseline orientation, motor grossly intact Skin: Skin is warm. No rashes, other new lesions, no LE edema Psychiatric: Pt behavior is normal without agitation  All otherwise neg per pt Lab Results  Component Value Date   WBC 8.7 01/15/2019   HGB 13.3 01/15/2019   HCT 39.8 01/15/2019   PLT 243.0 01/15/2019   GLUCOSE 83 01/15/2019   CHOL 233 (H) 03/30/2018   TRIG 94.0 03/30/2018   HDL 52.10 03/30/2018   LDLDIRECT 206.8 06/25/2010   LDLCALC 162 (H) 03/30/2018   ALT 11 01/15/2019   AST 12 01/15/2019   NA 139 01/15/2019   K 3.8 01/15/2019   CL 104 01/15/2019     CREATININE 0.72 01/15/2019   BUN 6 01/15/2019   CO2 26 01/15/2019   TSH 1.32 03/30/2018      Assessment & Plan:

## 2020-06-12 ENCOUNTER — Encounter: Payer: Self-pay | Admitting: Internal Medicine

## 2020-06-12 LAB — URINALYSIS, ROUTINE W REFLEX MICROSCOPIC
Bilirubin Urine: NEGATIVE
Glucose, UA: NEGATIVE
Hgb urine dipstick: NEGATIVE
Ketones, ur: NEGATIVE
Leukocytes,Ua: NEGATIVE
Nitrite: NEGATIVE
Protein, ur: NEGATIVE
Specific Gravity, Urine: 1.004 (ref 1.001–1.03)
pH: 7.5 (ref 5.0–8.0)

## 2020-06-12 LAB — CBC WITH DIFFERENTIAL/PLATELET
Absolute Monocytes: 559 cells/uL (ref 200–950)
Basophils Absolute: 86 cells/uL (ref 0–200)
Basophils Relative: 1 %
Eosinophils Absolute: 9 cells/uL — ABNORMAL LOW (ref 15–500)
Eosinophils Relative: 0.1 %
HCT: 42 % (ref 35.0–45.0)
Hemoglobin: 14.1 g/dL (ref 11.7–15.5)
Lymphs Abs: 3664 cells/uL (ref 850–3900)
MCH: 29.1 pg (ref 27.0–33.0)
MCHC: 33.6 g/dL (ref 32.0–36.0)
MCV: 86.8 fL (ref 80.0–100.0)
MPV: 11.1 fL (ref 7.5–12.5)
Monocytes Relative: 6.5 %
Neutro Abs: 4283 cells/uL (ref 1500–7800)
Neutrophils Relative %: 49.8 %
Platelets: 286 10*3/uL (ref 140–400)
RBC: 4.84 10*6/uL (ref 3.80–5.10)
RDW: 13.8 % (ref 11.0–15.0)
Total Lymphocyte: 42.6 %
WBC: 8.6 10*3/uL (ref 3.8–10.8)

## 2020-06-12 LAB — HEPATITIS C ANTIBODY
Hepatitis C Ab: NONREACTIVE
SIGNAL TO CUT-OFF: 0.01 (ref <1.00)

## 2020-06-12 LAB — COMPLETE METABOLIC PANEL WITH GFR
AG Ratio: 1.7 (calc) (ref 1.0–2.5)
ALT: 13 U/L (ref 6–29)
AST: 13 U/L (ref 10–35)
Albumin: 4.5 g/dL (ref 3.6–5.1)
Alkaline phosphatase (APISO): 123 U/L (ref 37–153)
BUN: 7 mg/dL (ref 7–25)
CO2: 23 mmol/L (ref 20–32)
Calcium: 9.2 mg/dL (ref 8.6–10.4)
Chloride: 107 mmol/L (ref 98–110)
Creat: 0.69 mg/dL (ref 0.50–1.05)
GFR, Est African American: 114 mL/min/{1.73_m2} (ref >=60)
GFR, Est Non African American: 99 mL/min/{1.73_m2} (ref >=60)
Globulin: 2.7 g/dL (calc) (ref 1.9–3.7)
Glucose, Bld: 90 mg/dL (ref 65–99)
Potassium: 3.8 mmol/L (ref 3.5–5.3)
Sodium: 140 mmol/L (ref 135–146)
Total Bilirubin: 0.5 mg/dL (ref 0.2–1.2)
Total Protein: 7.2 g/dL (ref 6.1–8.1)

## 2020-06-12 LAB — LIPID PANEL
Cholesterol: 257 mg/dL — ABNORMAL HIGH (ref <200)
HDL: 48 mg/dL — ABNORMAL LOW (ref >=50)
LDL Cholesterol (Calc): 186 mg/dL (calc) — ABNORMAL HIGH
Non-HDL Cholesterol (Calc): 209 mg/dL (calc) — ABNORMAL HIGH (ref <130)
Total CHOL/HDL Ratio: 5.4 (calc) — ABNORMAL HIGH (ref <5.0)
Triglycerides: 106 mg/dL (ref <150)

## 2020-06-12 LAB — TSH: TSH: 2.21 mIU/L

## 2020-06-12 LAB — VITAMIN D 25 HYDROXY (VIT D DEFICIENCY, FRACTURES): Vit D, 25-Hydroxy: 23 ng/mL — ABNORMAL LOW (ref 30–100)

## 2020-07-01 ENCOUNTER — Encounter: Payer: Self-pay | Admitting: Gastroenterology

## 2020-08-14 ENCOUNTER — Ambulatory Visit (AMBULATORY_SURGERY_CENTER): Payer: Self-pay | Admitting: *Deleted

## 2020-08-14 ENCOUNTER — Other Ambulatory Visit: Payer: Self-pay

## 2020-08-14 VITALS — Ht 62.0 in | Wt 168.0 lb

## 2020-08-14 DIAGNOSIS — Z1211 Encounter for screening for malignant neoplasm of colon: Secondary | ICD-10-CM

## 2020-08-14 DIAGNOSIS — Z01818 Encounter for other preprocedural examination: Secondary | ICD-10-CM

## 2020-08-14 MED ORDER — PLENVU 140 G PO SOLR: 1.0000 | ORAL | 0 refills | Status: DC

## 2020-08-14 NOTE — Progress Notes (Signed)
covid test 10-6 330 pm [place] [facility] No egg or soy allergy known to patient  No issues with past sedation with any surgeries or procedures no intubation problems in the past  No FH of Malignant Hyperthermia No diet pills per patient No home 02 use per patient  No blood thinners per patient  Pt denies issues with constipation  No A fib or A flutter  EMMI video to pt or via [place] 19 guidelines implemented in [facility] today with Pt a2nd RN   Plenvu  Coupon given to pt in [facility] today , Code to Pharmacy   Due to the COVID-19 pandemic we are asking patients to follow these guidelines. Please only bring one care partner. Please be aware that your care partner may wait in the car in the parking lot or if they feel like they will be too hot to wait in the car, they may wait in the lobby on the 4th floor. All care partners are required to wear a mask the entire time (we do not have any that we can provide them), they need to practice social distancing, and we will do a Covid check for all patient's and care partners when you arrive. Also we will check their temperature and your temperature. If the care partner waits in their car they need to stay in the parking lot the entire time and we will call them on their cell phone when the patient is ready for discharge so they can bring the car to the front of the building. Also all patient's will need to wear a mask into building.

## 2020-08-15 ENCOUNTER — Encounter: Payer: Self-pay | Admitting: Gastroenterology

## 2020-08-27 ENCOUNTER — Other Ambulatory Visit: Payer: Self-pay | Admitting: Gastroenterology

## 2020-08-27 LAB — SARS CORONAVIRUS 2 (TAT 6-24 HRS): SARS Coronavirus 2: NEGATIVE

## 2020-08-29 ENCOUNTER — Other Ambulatory Visit: Payer: Self-pay

## 2020-08-29 ENCOUNTER — Encounter: Payer: Self-pay | Admitting: Gastroenterology

## 2020-08-29 ENCOUNTER — Ambulatory Visit (AMBULATORY_SURGERY_CENTER): Payer: 59 | Admitting: Gastroenterology

## 2020-08-29 VITALS — BP 110/72 | HR 75 | Temp 97.3°F | Resp 11 | Ht 62.0 in | Wt 168.0 lb

## 2020-08-29 DIAGNOSIS — Z1211 Encounter for screening for malignant neoplasm of colon: Secondary | ICD-10-CM | POA: Diagnosis not present

## 2020-08-29 DIAGNOSIS — D124 Benign neoplasm of descending colon: Secondary | ICD-10-CM | POA: Diagnosis not present

## 2020-08-29 DIAGNOSIS — D123 Benign neoplasm of transverse colon: Secondary | ICD-10-CM | POA: Diagnosis not present

## 2020-08-29 DIAGNOSIS — D125 Benign neoplasm of sigmoid colon: Secondary | ICD-10-CM

## 2020-08-29 MED ORDER — SODIUM CHLORIDE 0.9 % IV SOLN: 500.0000 mL | Freq: Once | INTRAVENOUS | Status: DC

## 2020-08-29 NOTE — Progress Notes (Signed)
To PACU, VSS. Report to [email or web address] 

## 2020-08-29 NOTE — Progress Notes (Signed)
Reviewed patients medical history with no changes noted. VS WNL

## 2020-08-29 NOTE — Progress Notes (Signed)
Called to room to assist during endoscopic procedure.  Patient ID and intended procedure confirmed with present staff. Received instructions for my participation in the procedure from the performing physician.  

## 2020-08-29 NOTE — Patient Instructions (Signed)
Handouts provided on polyps, diverticulosis, hemorrhoids and high-fiber diet.   High-fiber diet recommended (see handout).   YOU HAD AN ENDOSCOPIC PROCEDURE TODAY AT [facility]:   Refer to the procedure report that was given to you for any specific questions about what was found during the examination.  If the procedure report does not answer your questions, please call your gastroenterologist to clarify.  If you requested that your care partner not be given the details of your procedure findings, then the procedure report has been included in a sealed envelope for you to review at your convenience later.  YOU SHOULD EXPECT: Some feelings of bloating in the abdomen. Passage of more gas than usual.  Walking can help get rid of the air that was put into your GI tract during the procedure and reduce the bloating. If you had a lower endoscopy (such as a colonoscopy or flexible sigmoidoscopy) you may notice spotting of blood in your stool or on the toilet paper. If you underwent a bowel prep for your procedure, you may not have a normal bowel movement for a few days.  Please Note:  You might notice some irritation and congestion in your nose or some drainage.  This is from the oxygen used during your procedure.  There is no need for concern and it should clear up in a day or so.  SYMPTOMS TO REPORT IMMEDIATELY:   Following lower endoscopy (colonoscopy or flexible sigmoidoscopy):  Excessive amounts of blood in the stool  Significant tenderness or worsening of abdominal pains  Swelling of the abdomen that is new, acute  Fever of 100F or higher  For urgent or emergent issues, a gastroenterologist can be reached at any hour by calling [phone]. Do not use MyChart messaging for urgent concerns.    DIET:  We do recommend a small meal at first, but then you may proceed to your regular diet.  Drink plenty of fluids but you should avoid alcoholic beverages for 24 hours.  ACTIVITY:   You should plan to take it easy for the rest of today and you should NOT DRIVE or use heavy machinery until tomorrow (because of the sedation medicines used during the test).    FOLLOW UP: Our staff will call the number listed on your records 48-72 hours following your procedure to check on you and address any questions or concerns that you may have regarding the information given to you following your procedure. If we do not reach you, we will leave a message.  We will attempt to reach you two times.  During this call, we will ask if you have developed any symptoms of COVID 19. If you develop any symptoms (ie: fever, flu-like symptoms, shortness of breath, cough etc.) before then, please call [phone].  If you test positive for Covid 19 in the 2 weeks post procedure, please call and report this information to [place].    If any biopsies were taken you will be contacted by phone or by letter within the next 1-3 weeks.  Please call us at [phone] if you have not heard about the biopsies in 3 weeks.    SIGNATURES/CONFIDENTIALITY: You and/or your care partner have signed paperwork which will be entered into your electronic medical record.  These signatures attest to the fact that that the information above on your After Visit Summary has been reviewed and is understood.  Full responsibility of the confidentiality of this discharge information lies with you and/or your care-partner.

## 2020-08-29 NOTE — Op Note (Signed)
[facility] Patient Name: Sandy Hanna Procedure Date: 08/29/2020 2:15 PM MRN: [id number] Endoscopist: Ladene Artist , MD Age: 54 Referring MD:  Date of Birth: 1966-09-09 Gender: Female Account #: [id number] Procedure:                Colonoscopy Indications:              Screening for colorectal malignant neoplasm Medicines:                Monitored Anesthesia Care Procedure:                Pre-Anesthesia Assessment:                           - Prior to the procedure, a History and Physical                            was performed, and patient medications and                            allergies were reviewed. The patient's tolerance of                            previous anesthesia was also reviewed. The risks                            and benefits of the procedure and the sedation                            options and risks were discussed with the patient.                            All questions were answered, and informed consent                            was obtained. Prior Anticoagulants: The patient has                            taken no previous anticoagulant or antiplatelet                            agents. ASA Grade Assessment: II - A patient with                            mild systemic disease. After reviewing the risks                            and benefits, the patient was deemed in                            satisfactory condition to undergo the procedure.                           After obtaining informed consent, the colonoscope  was passed under direct vision. Throughout the                            procedure, the patient's blood pressure, pulse, and                            oxygen saturations were monitored continuously. The                            Colonoscope was introduced through the anus and                            advanced to the the cecum, identified by                            appendiceal orifice and  ileocecal valve. The                            ileocecal valve, appendiceal orifice, and rectum                            were photographed. The quality of the bowel                            preparation was excellent. The colonoscopy was                            performed without difficulty. The patient tolerated                            the procedure well. Scope In: 2:23:46 PM Scope Out: 2:45:35 PM Scope Withdrawal Time: 0 hours 14 minutes 57 seconds  Total Procedure Duration: 0 hours 21 minutes 49 seconds  Findings:                 The perianal and digital rectal examinations were                            normal.                           Four sessile polyps were found in the sigmoid colon                            (1), descending colon (1) and transverse colon (2).                            The polyps were 5 to 8 mm in size. These polyps                            were removed with a cold snare. Resection and                            retrieval were complete.  A few small-mouthed diverticula were found in the                            left colon. There was no evidence of diverticular                            bleeding.                           Internal hemorrhoids were found during                            retroflexion. The hemorrhoids were small and Grade                            I (internal hemorrhoids that do not prolapse).                           The exam was otherwise without abnormality on                            direct and retroflexion views. Complications:            No immediate complications. Estimated blood loss:                            None. Estimated Blood Loss:     Estimated blood loss: none. Impression:               - Four 5 to 8 mm polyps in the sigmoid colon, in                            the descending colon and in the transverse colon,                            removed with a cold snare. Resected and  retrieved.                           - Mild diverticulosis in the left colon. .                           - Internal hemorrhoids.                           - The examination was otherwise normal on direct                            and retroflexion views. Recommendation:           - Repeat colonoscopy date to be determined after                            pending pathology results are reviewed for                            surveillance based on pathology results.                           -  Patient has a contact number available for                            emergencies. The signs and symptoms of potential                            delayed complications were discussed with the                            patient. Return to normal activities tomorrow.                            Written discharge instructions were provided to the                            patient.                           - High fiber diet.                           - Continue present medications.                           - Await pathology results. Ladene Artist, MD 08/29/2020 2:49:32 PM This report has been signed electronically.

## 2020-09-02 ENCOUNTER — Telehealth: Payer: Self-pay | Admitting: *Deleted

## 2020-09-02 NOTE — Telephone Encounter (Signed)
  Follow up Call-  Call back number 08/29/2020  Post procedure Call Back phone  # [phone]  Permission to leave phone message Yes  Some recent data might be hidden     Patient questions:  Do you have a fever, pain , or abdominal swelling? No. Pain Score  0 *  Have you tolerated food without any problems? Yes.    Have you been able to return to your normal activities? Yes.    Do you have any questions about your discharge instructions: Diet   No. Medications  No. Follow up visit  No.  Do you have questions or concerns about your Care? No.  Actions: * If pain score is 4 or above:  No action needed, pain <4  1. Have you developed a fever since your procedure? NO  2.   Have you had an respiratory symptoms (SOB or cough) since your procedure? NO  3.   Have you tested positive for COVID 19 since your procedure NO  4.   Have you had any family members/close contacts diagnosed with the COVID 19 since your procedure?  NO   If yes to any of these questions please route to Joylene John, RN and Joella Prince, RN

## 2020-09-08 ENCOUNTER — Encounter: Payer: Self-pay | Admitting: Gastroenterology

## 2021-01-27 ENCOUNTER — Other Ambulatory Visit: Payer: Self-pay

## 2021-01-28 ENCOUNTER — Telehealth (INDEPENDENT_AMBULATORY_CARE_PROVIDER_SITE_OTHER): Payer: 59 | Admitting: Internal Medicine

## 2021-01-28 DIAGNOSIS — J3089 Other allergic rhinitis: Secondary | ICD-10-CM

## 2021-01-28 MED ORDER — MONTELUKAST SODIUM 10 MG PO TABS: 10.0000 mg | ORAL_TABLET | Freq: Every day | ORAL | 0 refills | Status: DC

## 2021-01-28 NOTE — Progress Notes (Signed)
Virtual Visit via Audio Note  I connected with Sandy Hanna on 01/28/21 at  9:40 AM EST by an audio-only enabled telemedicine application and verified that I am speaking with the correct person using two identifiers.  The patient and the provider were at separate locations throughout the entire encounter. Patient location: home, Provider location: work   I discussed the limitations of evaluation and management by telemedicine and the availability of in person appointments. The patient expressed understanding and agreed to proceed. The patient and the provider were the only parties present for the visit unless noted in HPI below.  History of Present Illness: The patient is a 55 y.o. female with visit for sinus problems. Went out Sunday in the wind and does have significant allergies. Started feeling bad on Sunday evening. Has allegra and nasacort which she is taking daily without missing. Usually that does good. Denies fevers or chills. Having pain in the ears. She is a smoker less than 1/2 pack per day. Overall it is not improving. Has not gotten the covid-19 vaccine. Has not gotten tested for covid-19. Denies known sick contacts.   Observations/Objective: A and O times 3, voice strong, no coughing during visit  Assessment and Plan: See problem oriented charting  Follow Up Instructions: rx singulair to add to allegra and nasacort, if no improvement in 1 week call back  Visit time 12 minutes in non-face to face communication with patient and coordination of care.   I discussed the assessment and treatment plan with the patient. The patient was provided an opportunity to ask questions and all were answered. The patient agreed with the plan and demonstrated an understanding of the instructions.   The patient was advised to call back or seek an in-person evaluation if the symptoms worsen or if the condition fails to improve as anticipated.  Hoyt Koch, MD

## 2021-01-29 ENCOUNTER — Encounter: Payer: Self-pay | Admitting: Internal Medicine

## 2021-01-29 NOTE — Assessment & Plan Note (Signed)
Appears to be typical allergic symptoms rather than infection. Explained why antibiotics are not indicated. Rx singulair and call back in 1 week if no improvement.

## 2021-02-18 ENCOUNTER — Telehealth: Payer: Self-pay | Admitting: Internal Medicine

## 2021-02-18 NOTE — Telephone Encounter (Signed)
Spoke with the patient and she has declined in a follow up visit due to her taking so much time off of work. She stated that she would call us back if she changes her mind.

## 2021-02-18 NOTE — Telephone Encounter (Signed)
Patient called and said that montelukast (SINGULAIR) 10 MG tablet did not help her. She said that it really dried her out. She was wondering if a z- pack could be called in. She said she is having the same symptoms, possible sinus infection and headache. She did a virtual w/ Dr. Sharlet Salina on 01/28/21. Please advise    [id number] - [place], [place] [facility] [address]

## 2021-02-18 NOTE — Telephone Encounter (Signed)
See below

## 2021-02-18 NOTE — Telephone Encounter (Signed)
Would likely need assessment as this is now about 3 weeks after her visit.

## 2021-02-19 ENCOUNTER — Encounter: Payer: Self-pay | Admitting: Internal Medicine

## 2021-02-19 ENCOUNTER — Telehealth (INDEPENDENT_AMBULATORY_CARE_PROVIDER_SITE_OTHER): Payer: 59 | Admitting: Internal Medicine

## 2021-02-19 DIAGNOSIS — J019 Acute sinusitis, unspecified: Secondary | ICD-10-CM | POA: Diagnosis not present

## 2021-02-19 DIAGNOSIS — J3089 Other allergic rhinitis: Secondary | ICD-10-CM | POA: Diagnosis not present

## 2021-02-19 DIAGNOSIS — I10 Essential (primary) hypertension: Secondary | ICD-10-CM | POA: Diagnosis not present

## 2021-02-19 MED ORDER — FLUCONAZOLE 150 MG PO TABS: ORAL_TABLET | ORAL | 1 refills | Status: DC

## 2021-02-19 MED ORDER — BENZONATATE 100 MG PO CAPS: 100.0000 mg | ORAL_CAPSULE | Freq: Three times a day (TID) | ORAL | 1 refills | Status: AC | PRN

## 2021-02-19 MED ORDER — AZITHROMYCIN 250 MG PO TABS: ORAL_TABLET | ORAL | 1 refills | Status: DC

## 2021-02-19 NOTE — Assessment & Plan Note (Signed)
Pt to continue to monitor BP at home with goal < 140/90 at minimum

## 2021-02-19 NOTE — Assessment & Plan Note (Signed)
Mild to mod, for antibx course,  to f/u any worsening symptoms or concerns 

## 2021-02-19 NOTE — Assessment & Plan Note (Signed)
With mild flare, for singulair and flonase used regularly

## 2021-02-19 NOTE — Patient Instructions (Signed)
Please take all new medication as prescribed 

## 2021-02-19 NOTE — Progress Notes (Signed)
Patient ID: Sandy Hanna, female   DOB: May 01, 1966, 55 y.o.   MRN: [id number]  Virtual Visit via Video Note  I connected with [name] on 02/19/21 at  2:40 PM EDT by a video enabled telemedicine application and verified that I am speaking with the correct person using two identifiers.  Location of all participants today Patient: at home Provider: at office   I discussed the limitations of evaluation and management by telemedicine and the availability of in person appointments. The patient expressed understanding and agreed to proceed.  History of Present Illness:  Here with 2-3 days acute onset fever, facial pain, pressure, headache, general weakness and malaise, and greenish d/c, with mild ST and cough, but pt denies chest pain, wheezing, increased sob or doe, orthopnea, PND, increased LE swelling, palpitations, dizziness or syncope.  Does have several wks ongoing nasal allergy symptoms with clearish congestion, itch and sneezing, without fever, pain, ST, cough, swelling or wheezing.  Denies new worsening focal neuro s/s.   Pt denies polydipsia, polyuria, Past Medical History:  Diagnosis Date  . Acute sinusitis, unspecified 12/30/2007  . ALLERGIC RHINITIS 12/15/2007  . Allergy   . ANEMIA-NOS 12/18/2010  . ANXIETY 12/15/2007  . BLEPHARITIS, RIGHT 04/03/2008  . CHEST PAIN 08/25/2010  . DEPRESSION 12/15/2007  . FATIGUE 08/25/2010  . GERD 12/15/2007  . HYPERLIPIDEMIA 07/03/2010  . HYPERSOMNIA 12/18/2010  . HYPERTENSION 06/27/2007  . Irritable bowel syndrome 04/28/2009  . ISCHEMIC COLITIS 04/28/2009  . Migraines 12/23/2008  . Neuromuscular disorder ([place])    cervical slipped disc 2020   . OTITIS MEDIA, BILATERAL 09/12/2008  . RECTAL BLEEDING 04/28/2009  . Vitamin D deficiency 03/23/2016   Past Surgical History:  Procedure Laterality Date  . BREAST BIOPSY  2005   Negative  . COLONOSCOPY    . UPPER GASTROINTESTINAL ENDOSCOPY  12/2007    reports that she has been smoking cigarettes. She started  smoking about 32 years ago. She has a 9.00 pack-year smoking history. She has never used smokeless tobacco. She reports that she does not drink alcohol and does not use drugs. family history includes Breast cancer in her mother; Diabetes in her mother and paternal grandmother; Hypertension in her mother; Ovarian cancer in her maternal uncle and another family member. Allergies  Allergen Reactions  . Amoxicillin-Pot Clavulanate     REACTION: nausea  . Clarithromycin     REACTION: Nausea  . Crestor [Rosuvastatin] Other (See Comments)    bilatera shoulder pain  . Doxycycline     REACTION: severe fatigue per pt  . Lipitor [Atorvastatin Calcium] Other (See Comments)    Myalgias joint pain  . Lisinopril Other (See Comments)    Angioedema left upper and lower lids   Current Outpatient Medications on File Prior to Visit  Medication Sig Dispense Refill  . ALPRAZolam (XANAX) 0.25 MG tablet Take 1 tablet (0.25 mg total) by mouth 2 (two) times daily as needed for anxiety. (Patient not taking: Reported on 08/29/2020) 40 tablet 0  . amLODipine (NORVASC) 10 MG tablet Take 1 tablet (10 mg total) by mouth daily. 90 tablet 3  . aspirin 81 MG EC tablet Take 1 tablet (81 mg total) by mouth daily. 30 tablet 5  . cyclobenzaprine (FLEXERIL) 5 MG tablet cyclobenzaprine 5 mg tablet  TAKE 1 TABLET BY MOUTH THREE TIMES A DAY AS NEEDED FOR MUSCLE SPASMS    . dicyclomine (BENTYL) 20 MG tablet Take 1 tablet (20 mg total) by mouth 3 (three) times daily as needed  for spasms. 90 tablet 3  . fluticasone (FLONASE) 50 MCG/ACT nasal spray Place 2 sprays into both nostrils daily. (Patient not taking: Reported on 08/29/2020) 16 g 6  . ibuprofen (ADVIL) 800 MG tablet ibuprofen 800 mg tablet  TAKE 1 TABLET BY MOUTH EVERY 8 HOURS AS NEEDED (Patient not taking: Reported on 08/29/2020)    . montelukast (SINGULAIR) 10 MG tablet Take 1 tablet (10 mg total) by mouth at bedtime. 30 tablet 0  . pantoprazole (PROTONIX) 40 MG tablet Take 1  tablet (40 mg total) by mouth daily. 90 tablet 3   No current facility-administered medications on file prior to visit.    Observations/Objective: Alert, NAD, appropriate mood and affect, resps normal, cn 2-12 intact, moves all 4s, no visible rash or swelling Lab Results  Component Value Date   WBC 8.6 06/11/2020   HGB 14.1 06/11/2020   HCT 42.0 06/11/2020   PLT 286 06/11/2020   GLUCOSE 90 06/11/2020   CHOL 257 (H) 06/11/2020   TRIG 106 06/11/2020   HDL 48 (L) 06/11/2020   LDLDIRECT 206.8 06/25/2010   LDLCALC 186 (H) 06/11/2020   ALT 13 06/11/2020   AST 13 06/11/2020   NA 140 06/11/2020   K 3.8 06/11/2020   CL 107 06/11/2020   CREATININE 0.69 06/11/2020   BUN 7 06/11/2020   CO2 23 06/11/2020   TSH 2.21 06/11/2020   Assessment and Plan: See notes  Follow Up Instructions: See notes   I discussed the assessment and treatment plan with the patient. The patient was provided an opportunity to ask questions and all were answered. The patient agreed with the plan and demonstrated an understanding of the instructions.   The patient was advised to call back or seek an in-person evaluation if the symptoms worsen or if the condition fails to improve as anticipated.  [name], MD

## 2021-05-22 ENCOUNTER — Ambulatory Visit: Payer: 59 | Admitting: Internal Medicine

## 2021-05-27 ENCOUNTER — Ambulatory Visit: Payer: No Typology Code available for payment source | Admitting: Internal Medicine

## 2021-06-12 ENCOUNTER — Encounter: Payer: 59 | Admitting: Internal Medicine

## 2021-06-23 ENCOUNTER — Other Ambulatory Visit (INDEPENDENT_AMBULATORY_CARE_PROVIDER_SITE_OTHER): Payer: No Typology Code available for payment source

## 2021-06-23 ENCOUNTER — Ambulatory Visit (INDEPENDENT_AMBULATORY_CARE_PROVIDER_SITE_OTHER): Payer: No Typology Code available for payment source | Admitting: Internal Medicine

## 2021-06-23 ENCOUNTER — Encounter: Payer: Self-pay | Admitting: Internal Medicine

## 2021-06-23 ENCOUNTER — Other Ambulatory Visit: Payer: Self-pay

## 2021-06-23 VITALS — BP 120/78 | HR 90 | Temp 98.4°F | Ht 62.0 in | Wt 168.0 lb

## 2021-06-23 DIAGNOSIS — E538 Deficiency of other specified B group vitamins: Secondary | ICD-10-CM

## 2021-06-23 DIAGNOSIS — I10 Essential (primary) hypertension: Secondary | ICD-10-CM

## 2021-06-23 DIAGNOSIS — E785 Hyperlipidemia, unspecified: Secondary | ICD-10-CM

## 2021-06-23 DIAGNOSIS — G720 Drug-induced myopathy: Secondary | ICD-10-CM

## 2021-06-23 DIAGNOSIS — E559 Vitamin D deficiency, unspecified: Secondary | ICD-10-CM

## 2021-06-23 DIAGNOSIS — H04302 Unspecified dacryocystitis of left lacrimal passage: Secondary | ICD-10-CM | POA: Diagnosis not present

## 2021-06-23 DIAGNOSIS — T466X5A Adverse effect of antihyperlipidemic and antiarteriosclerotic drugs, initial encounter: Secondary | ICD-10-CM

## 2021-06-23 DIAGNOSIS — Z0001 Encounter for general adult medical examination with abnormal findings: Secondary | ICD-10-CM

## 2021-06-23 LAB — CBC WITH DIFFERENTIAL/PLATELET
Basophils Absolute: 0.1 10*3/uL (ref 0.0–0.1)
Basophils Relative: 0.6 % (ref 0.0–3.0)
Eosinophils Absolute: 0.1 10*3/uL (ref 0.0–0.7)
Eosinophils Relative: 0.7 % (ref 0.0–5.0)
HCT: 40 % (ref 36.0–46.0)
Hemoglobin: 13.3 g/dL (ref 12.0–15.0)
Lymphocytes Relative: 50 % — ABNORMAL HIGH (ref 12.0–46.0)
Lymphs Abs: 5.3 10*3/uL — ABNORMAL HIGH (ref 0.7–4.0)
MCHC: 33.2 g/dL (ref 30.0–36.0)
MCV: 86.4 fl (ref 78.0–100.0)
Monocytes Absolute: 0.9 10*3/uL (ref 0.1–1.0)
Monocytes Relative: 8.6 % (ref 3.0–12.0)
Neutro Abs: 4.3 10*3/uL (ref 1.4–7.7)
Neutrophils Relative %: 40.1 % — ABNORMAL LOW (ref 43.0–77.0)
Platelets: 341 10*3/uL (ref 150.0–400.0)
RBC: 4.62 Mil/uL (ref 3.87–5.11)
RDW: 14.7 % (ref 11.5–15.5)
WBC: 10.7 10*3/uL — ABNORMAL HIGH (ref 4.0–10.5)

## 2021-06-23 LAB — BASIC METABOLIC PANEL
BUN: 5 mg/dL — ABNORMAL LOW (ref 6–23)
CO2: 26 mEq/L (ref 19–32)
Calcium: 9.1 mg/dL (ref 8.4–10.5)
Chloride: 103 mEq/L (ref 96–112)
Creatinine, Ser: 0.71 mg/dL (ref 0.40–1.20)
GFR: 95.47 mL/min (ref >60.00)
Glucose, Bld: 80 mg/dL (ref 70–99)
Potassium: 3.7 mEq/L (ref 3.5–5.1)
Sodium: 139 mEq/L (ref 135–145)

## 2021-06-23 LAB — LIPID PANEL
Cholesterol: 216 mg/dL — ABNORMAL HIGH (ref 0–200)
HDL: 44.6 mg/dL (ref >39.00)
LDL Cholesterol: 152 mg/dL — ABNORMAL HIGH (ref 0–99)
NonHDL: 171.53
Total CHOL/HDL Ratio: 5
Triglycerides: 100 mg/dL (ref 0.0–149.0)
VLDL: 20 mg/dL (ref 0.0–40.0)

## 2021-06-23 LAB — HEPATIC FUNCTION PANEL
ALT: 11 U/L (ref 0–35)
AST: 12 U/L (ref 0–37)
Albumin: 4.2 g/dL (ref 3.5–5.2)
Alkaline Phosphatase: 136 U/L — ABNORMAL HIGH (ref 39–117)
Bilirubin, Direct: 0.1 mg/dL (ref 0.0–0.3)
Total Bilirubin: 0.3 mg/dL (ref 0.2–1.2)
Total Protein: 7.5 g/dL (ref 6.0–8.3)

## 2021-06-23 MED ORDER — OFLOXACIN 0.3 % OP SOLN: 1.0000 [drp] | Freq: Four times a day (QID) | OPHTHALMIC | 2 refills | Status: DC

## 2021-06-23 NOTE — Assessment & Plan Note (Signed)

## 2021-06-23 NOTE — Assessment & Plan Note (Signed)
Last vitamin D Lab Results  Component Value Date   VD25OH 23 (L) 06/11/2020   Low to start oral replacement

## 2021-06-23 NOTE — Assessment & Plan Note (Signed)
BP Readings from Last 3 Encounters:  06/23/21 120/78  08/29/20 110/72  06/11/20 132/68   Stable, pt to continue medical treatment amlodipine

## 2021-06-23 NOTE — Progress Notes (Signed)
Patient ID: Sandy Hanna, female   DOB: Oct 21, 1966, 55 y.o.   MRN: [id number]         Chief Complaint:: wellness exam and hld, statin myopathy, recurrent left eye conjunctivitis       HPI:  Sandy Hanna is a 55 y.o. female here for wellness exam; o/w up to date currently with preventive referrals and immunizations.                          Also trying to follow lower chol diet, but has persistenlty elevated LDL.  Asks for Ct cardiac score.  Has not been able to tolerate 2 statin in past.  Also has c/o left eye conjunctivits 2-3 times per yr for several yrs, has seen ENT and optho and may have an insufficient lacrimal duct but no specific tx.  Pt denies chest pain, increased sob or doe, wheezing, orthopnea, PND, increased LE swelling, palpitations, dizziness or syncope.   Pt denies polydipsia, polyuria, or new focal neuro s/s.   Pt denies fever, wt loss, night sweats, loss of appetite, or other constitutional symptoms  Not taking Vit d   Wt Readings from Last 3 Encounters:  06/23/21 168 lb (76.2 kg)  08/29/20 168 lb (76.2 kg)  08/14/20 168 lb (76.2 kg)   BP Readings from Last 3 Encounters:  06/23/21 120/78  08/29/20 110/72  06/11/20 132/68   Immunization History  Administered Date(s) Administered   Td 11/22/2002   Tdap 03/23/2016   There are no preventive care reminders to display for this patient.     Past Medical History:  Diagnosis Date   Acute sinusitis, unspecified 12/30/2007   ALLERGIC RHINITIS 12/15/2007   Allergy    ANEMIA-NOS 12/18/2010   ANXIETY 12/15/2007   BLEPHARITIS, RIGHT 04/03/2008   CHEST PAIN 08/25/2010   DEPRESSION 12/15/2007   FATIGUE 08/25/2010   GERD 12/15/2007   HYPERLIPIDEMIA 07/03/2010   HYPERSOMNIA 12/18/2010   HYPERTENSION 06/27/2007   Irritable bowel syndrome 04/28/2009   ISCHEMIC COLITIS 04/28/2009   Migraines 12/23/2008   Neuromuscular disorder ([place])    cervical slipped disc 2020    OTITIS MEDIA, BILATERAL 09/12/2008   RECTAL BLEEDING 04/28/2009    Vitamin D deficiency 03/23/2016   Past Surgical History:  Procedure Laterality Date   BREAST BIOPSY  2005   Negative   COLONOSCOPY     UPPER GASTROINTESTINAL ENDOSCOPY  12/2007    reports that she quit smoking about 7 months ago. Her smoking use included cigarettes. She started smoking about 32 years ago. She has a 9.00 pack-year smoking history. She has never used smokeless tobacco. She reports that she does not drink alcohol and does not use drugs. family history includes Breast cancer in her mother; Diabetes in her mother and paternal grandmother; Hypertension in her mother; Ovarian cancer in her maternal uncle and another family member. Allergies  Allergen Reactions   Amoxicillin-Pot Clavulanate     REACTION: nausea   Clarithromycin     REACTION: Nausea   Crestor [Rosuvastatin] Other (See Comments)    bilatera shoulder pain   Doxycycline     REACTION: severe fatigue per pt   Lipitor [Atorvastatin Calcium] Other (See Comments)    Myalgias joint pain   Lisinopril Other (See Comments)    Angioedema left upper and lower lids   Current Outpatient Medications on File Prior to Visit  Medication Sig Dispense Refill   ALPRAZolam (XANAX) 0.25 MG tablet Take 1 tablet (0.25 mg total)  by mouth 2 (two) times daily as needed for anxiety. 40 tablet 0   amLODipine (NORVASC) 10 MG tablet Take 1 tablet (10 mg total) by mouth daily. 90 tablet 3   aspirin 81 MG EC tablet Take 1 tablet (81 mg total) by mouth daily. 30 tablet 5   azithromycin (ZITHROMAX) 250 MG tablet 2 tab by mouth day 1, then 1 per day 6 tablet 1   benzonatate (TESSALON PERLES) 100 MG capsule Take 1 capsule (100 mg total) by mouth 3 (three) times daily as needed for cough. 40 capsule 1   cyclobenzaprine (FLEXERIL) 5 MG tablet cyclobenzaprine 5 mg tablet  TAKE 1 TABLET BY MOUTH THREE TIMES A DAY AS NEEDED FOR MUSCLE SPASMS     dicyclomine (BENTYL) 20 MG tablet Take 1 tablet (20 mg total) by mouth 3 (three) times daily as needed for  spasms. 90 tablet 3   fluconazole (DIFLUCAN) 150 MG tablet 1 tab by mouth every 3 days as needed 2 tablet 1   fluticasone (FLONASE) 50 MCG/ACT nasal spray Place 2 sprays into both nostrils daily. 16 g 6   ibuprofen (ADVIL) 800 MG tablet ibuprofen 800 mg tablet  TAKE 1 TABLET BY MOUTH EVERY 8 HOURS AS NEEDED     montelukast (SINGULAIR) 10 MG tablet Take 1 tablet (10 mg total) by mouth at bedtime. 30 tablet 0   pantoprazole (PROTONIX) 40 MG tablet Take 1 tablet (40 mg total) by mouth daily. 90 tablet 3   albuterol (VENTOLIN HFA) 108 (90 Base) MCG/ACT inhaler Inhale 2 puffs into the lungs every 4 (four) hours.     No current facility-administered medications on file prior to visit.        ROS:  All others reviewed and negative.  Objective        PE:  BP 120/78 (BP Location: Left Arm, Patient Position: Sitting, Cuff Size: Normal)   Pulse 90   Temp 98.4 F (36.9 C) (Oral)   Ht '5\' 2"'$  (1.575 m)   Wt 168 lb (76.2 kg)   SpO2 99%   BMI 30.73 kg/m                 Constitutional: Pt appears in NAD               HENT: Head: NCAT.                Right Ear: External ear normal.                 Left Ear: External ear normal.                Eyes: . Pupils are equal, round, and reactive to light. Conjunctivae and EOM are normal               Nose: without d/c or deformity               Neck: Neck supple. Gross normal ROM               Cardiovascular: Normal rate and regular rhythm.                 Pulmonary/Chest: Effort normal and breath sounds without rales or wheezing.                Abd:  Soft, NT, ND, + BS, no organomegaly               Neurological: Pt is alert. At baseline orientation, motor grossly intact  Skin: Skin is warm. No rashes, no other new lesions, LE edema - none               Psychiatric: Pt behavior is normal without agitation   Micro: none  Cardiac tracings I have personally interpreted today:  none  Pertinent Radiological findings (summarize): none   Lab  Results  Component Value Date   WBC 8.6 06/11/2020   HGB 14.1 06/11/2020   HCT 42.0 06/11/2020   PLT 286 06/11/2020   GLUCOSE 90 06/11/2020   CHOL 257 (H) 06/11/2020   TRIG 106 06/11/2020   HDL 48 (L) 06/11/2020   LDLDIRECT 206.8 06/25/2010   LDLCALC 186 (H) 06/11/2020   ALT 13 06/11/2020   AST 13 06/11/2020   NA 140 06/11/2020   K 3.8 06/11/2020   CL 107 06/11/2020   CREATININE 0.69 06/11/2020   BUN 7 06/11/2020   CO2 23 06/11/2020   TSH 2.21 06/11/2020   Assessment/Plan:  CHARITO ZULLO is a 55 y.o. Black or African American [2] female with  has a past medical history of Acute sinusitis, unspecified (12/30/2007), ALLERGIC RHINITIS (12/15/2007), Allergy, ANEMIA-NOS (12/18/2010), ANXIETY (12/15/2007), BLEPHARITIS, RIGHT (04/03/2008), CHEST PAIN (08/25/2010), DEPRESSION (12/15/2007), FATIGUE (08/25/2010), GERD (12/15/2007), HYPERLIPIDEMIA (07/03/2010), HYPERSOMNIA (12/18/2010), HYPERTENSION (06/27/2007), Irritable bowel syndrome (04/28/2009), ISCHEMIC COLITIS (04/28/2009), Migraines (12/23/2008), Neuromuscular disorder ([place]), OTITIS MEDIA, BILATERAL (09/12/2008), RECTAL BLEEDING (04/28/2009), and Vitamin D deficiency (03/23/2016).  Vitamin D deficiency Last vitamin D Lab Results  Component Value Date   VD25OH 56 (L) 06/11/2020   Low to start oral replacement   Encounter for well adult exam with abnormal findings Age and sex appropriate education and counseling updated with regular exercise and diet Referrals for preventative services - none needed Immunizations addressed - none needed Smoking counseling  - none needed Evidence for depression or other mood disorder - none significant Most recent labs reviewed. I have personally reviewed and have noted: 1) the patient's medical and social history 2) The patient's current medications and supplements 3) The patient's height, weight, and BMI have been recorded in the chart   Hyperlipidemia Lab Results  Component Value Date   LDLCALC 186 (H)  06/11/2020   Stable, pt to continue current low chol diet, also for  Cardiac Ct Score, and if abnormal consider lipid clinic referral   Essential hypertension BP Readings from Last 3 Encounters:  06/23/21 120/78  08/29/20 110/72  06/11/20 132/68   Stable, pt to continue medical treatment amlodipine   Statin myopathy Unable for statin tx, consider lipid clinic  Dacryocystitis Has seen optho and ENT and no specific tx recommended, for ocuflox course prn,  to f/u any worsening symptoms or concerns  Followup: Return in about 1 year (around 06/23/2022).  [name], MD 06/23/2021 10:25 PM [facility]

## 2021-06-23 NOTE — Assessment & Plan Note (Signed)
Has seen optho and ENT and no specific tx recommended, for ocuflox course prn,  to f/u any worsening symptoms or concerns

## 2021-06-23 NOTE — Patient Instructions (Addendum)
Please take OTC Vitamin D3 at 2000 units per day, indefinitely  We have discussed the Cardiac CT Score test to measure the calcification level (if any) in your heart arteries.  This test has been ordered in our [facility], so please call [place] CT directly, as they prefer this, at [phone] to be scheduled.  Please take all new medication as prescribed - the eye antibiotic as needed  Please continue all other medications as before, and refills have been done if requested.  Please have the pharmacy call with any other refills you may need.  Please continue your efforts at being more active, low cholesterol diet, and weight control.  You are otherwise up to date with prevention measures today.  Please keep your appointments with your specialists as you may have planned  Please go to the LAB at the blood drawing area for the tests to be done  You will be contacted by phone if any changes need to be made immediately.  Otherwise, you will receive a letter about your results with an explanation, but please check with MyChart first.  Please remember to sign up for MyChart if you have not done so, as this will be important to you in the future with finding out test results, communicating by private email, and scheduling acute appointments online when needed.  Please make an Appointment to return for your 1 year visit, or sooner if needed

## 2021-06-23 NOTE — Assessment & Plan Note (Signed)
Lab Results  Component Value Date   LDLCALC 186 (H) 06/11/2020   Stable, pt to continue current low chol diet, also for  Cardiac Ct Score, and if abnormal consider lipid clinic referral

## 2021-06-23 NOTE — Assessment & Plan Note (Signed)
Unable for statin tx, consider lipid clinic

## 2021-06-24 ENCOUNTER — Other Ambulatory Visit: Payer: Self-pay | Admitting: Internal Medicine

## 2021-06-24 ENCOUNTER — Encounter: Payer: Self-pay | Admitting: Internal Medicine

## 2021-06-24 LAB — URINALYSIS, ROUTINE W REFLEX MICROSCOPIC
Bilirubin Urine: NEGATIVE
Hgb urine dipstick: NEGATIVE
Ketones, ur: NEGATIVE
Leukocytes,Ua: NEGATIVE
Nitrite: NEGATIVE
RBC / HPF: NONE SEEN (ref 0–?)
Specific Gravity, Urine: 1.01 (ref 1.000–1.030)
Total Protein, Urine: NEGATIVE
Urine Glucose: NEGATIVE
Urobilinogen, UA: 0.2 (ref 0.0–1.0)
pH: 6 (ref 5.0–8.0)

## 2021-06-24 LAB — VITAMIN B12: Vitamin B-12: 284 pg/mL (ref 211–911)

## 2021-06-24 LAB — VITAMIN D 25 HYDROXY (VIT D DEFICIENCY, FRACTURES): VITD: 23.09 ng/mL — ABNORMAL LOW (ref 30.00–100.00)

## 2021-06-24 LAB — TSH: TSH: 2.36 u[IU]/mL (ref 0.35–5.50)

## 2021-07-07 ENCOUNTER — Other Ambulatory Visit: Payer: Self-pay | Admitting: Internal Medicine

## 2021-07-07 NOTE — Telephone Encounter (Signed)
Please refill as per office routine med refill policy (all routine meds refilled for 3 mo or monthly per pt preference up to one year from last visit, then month to month grace period for 3 mo, then further med refills will have to be denied)  

## 2021-07-08 ENCOUNTER — Encounter: Payer: Self-pay | Admitting: Internal Medicine

## 2021-07-16 ENCOUNTER — Telehealth: Payer: Self-pay

## 2021-07-16 NOTE — Telephone Encounter (Signed)
I cannot tell why this was sent to me Protonix and amlodipine have already been refilled

## 2021-08-05 ENCOUNTER — Telehealth: Payer: Self-pay | Admitting: Internal Medicine

## 2021-08-05 NOTE — Telephone Encounter (Signed)
   Patient requesting [facility] appointment with Dr Ronnald Ramp, transfer from Dr Jenny Reichmann

## 2021-08-05 NOTE — Telephone Encounter (Signed)
Ok with me 

## 2021-11-04 ENCOUNTER — Telehealth: Payer: Self-pay | Admitting: Internal Medicine

## 2021-11-04 DIAGNOSIS — E785 Hyperlipidemia, unspecified: Secondary | ICD-10-CM

## 2021-11-04 NOTE — Telephone Encounter (Signed)
Ok for this  We have discussed the Cardiac CT Score test to measure the calcification level (if any) in your heart arteries.  This test has been ordered in our [place], so please call [place] CT directly, as they prefer this, at [phone] to be scheduled.

## 2021-11-04 NOTE — Telephone Encounter (Signed)
Patient calling in  Patient says she never got a chance to schedule her CT Cardiac Scoring test & the referral has been closed  Would like provider to submit new referral so she can have test done  Please call patient to let her know when referral has been placed [phone]

## 2021-11-04 NOTE — Telephone Encounter (Signed)
Patient notified

## 2021-11-10 ENCOUNTER — Telehealth: Payer: No Typology Code available for payment source | Admitting: Family Medicine

## 2021-11-17 ENCOUNTER — Other Ambulatory Visit: Payer: Self-pay

## 2021-11-17 ENCOUNTER — Ambulatory Visit (INDEPENDENT_AMBULATORY_CARE_PROVIDER_SITE_OTHER)
Admission: RE | Admit: 2021-11-17 | Discharge: 2021-11-17 | Disposition: A | Discharge status: Home / Self Care | Payer: Self-pay | Source: Ambulatory Visit | Attending: Internal Medicine | Admitting: Internal Medicine

## 2021-11-17 DIAGNOSIS — E785 Hyperlipidemia, unspecified: Secondary | ICD-10-CM

## 2021-11-19 ENCOUNTER — Telehealth: Payer: Self-pay | Admitting: Internal Medicine

## 2021-11-19 ENCOUNTER — Other Ambulatory Visit: Payer: Self-pay | Admitting: Internal Medicine

## 2021-11-19 ENCOUNTER — Encounter: Payer: Self-pay | Admitting: Internal Medicine

## 2021-11-19 DIAGNOSIS — R931 Abnormal findings on diagnostic imaging of heart and coronary circulation: Secondary | ICD-10-CM

## 2021-11-19 MED ORDER — ASPIRIN 81 MG PO TBEC: 81.0000 mg | DELAYED_RELEASE_TABLET | Freq: Every day | ORAL | 99 refills | Status: AC

## 2021-11-19 MED ORDER — LOVASTATIN 20 MG PO TABS: 20.0000 mg | ORAL_TABLET | Freq: Every day | ORAL | 3 refills | Status: DC

## 2021-11-19 NOTE — Telephone Encounter (Signed)
Discussed results in detail with patient

## 2021-11-19 NOTE — Telephone Encounter (Signed)
Patient checking status of  ct cardiac scoring results  Informed patient of provider's 11-19-2021 result notes and recommendations  Patient requesting a call back to discuss ct score and lovastatin medication prescribed

## 2022-04-02 DIAGNOSIS — J342 Deviated nasal septum: Secondary | ICD-10-CM | POA: Insufficient documentation

## 2022-05-07 ENCOUNTER — Encounter: Payer: Self-pay | Admitting: Internal Medicine

## 2022-05-07 ENCOUNTER — Ambulatory Visit: Payer: No Typology Code available for payment source | Admitting: Internal Medicine

## 2022-05-07 VITALS — BP 118/72 | HR 77 | Temp 99.1°F | Ht 62.0 in | Wt 161.6 lb

## 2022-05-07 DIAGNOSIS — R739 Hyperglycemia, unspecified: Secondary | ICD-10-CM | POA: Diagnosis not present

## 2022-05-07 DIAGNOSIS — Z0001 Encounter for general adult medical examination with abnormal findings: Secondary | ICD-10-CM | POA: Diagnosis not present

## 2022-05-07 DIAGNOSIS — E559 Vitamin D deficiency, unspecified: Secondary | ICD-10-CM

## 2022-05-07 DIAGNOSIS — I10 Essential (primary) hypertension: Secondary | ICD-10-CM

## 2022-05-07 DIAGNOSIS — F411 Generalized anxiety disorder: Secondary | ICD-10-CM

## 2022-05-07 DIAGNOSIS — E785 Hyperlipidemia, unspecified: Secondary | ICD-10-CM | POA: Diagnosis not present

## 2022-05-07 DIAGNOSIS — F4000 Agoraphobia, unspecified: Secondary | ICD-10-CM | POA: Diagnosis not present

## 2022-05-07 DIAGNOSIS — F419 Anxiety disorder, unspecified: Secondary | ICD-10-CM | POA: Diagnosis not present

## 2022-05-07 DIAGNOSIS — E538 Deficiency of other specified B group vitamins: Secondary | ICD-10-CM | POA: Diagnosis not present

## 2022-05-07 LAB — LIPID PANEL
Cholesterol: 238 mg/dL — ABNORMAL HIGH (ref 0–200)
HDL: 47.5 mg/dL (ref >39.00)
LDL Cholesterol: 164 mg/dL — ABNORMAL HIGH (ref 0–99)
NonHDL: 190.28
Total CHOL/HDL Ratio: 5
Triglycerides: 130 mg/dL (ref 0.0–149.0)
VLDL: 26 mg/dL (ref 0.0–40.0)

## 2022-05-07 LAB — CBC WITH DIFFERENTIAL/PLATELET
Basophils Absolute: 0.1 10*3/uL (ref 0.0–0.1)
Basophils Relative: 1 % (ref 0.0–3.0)
Eosinophils Absolute: 0 10*3/uL (ref 0.0–0.7)
Eosinophils Relative: 0.1 % (ref 0.0–5.0)
HCT: 40.8 % (ref 36.0–46.0)
Hemoglobin: 13.4 g/dL (ref 12.0–15.0)
Lymphocytes Relative: 49.3 % — ABNORMAL HIGH (ref 12.0–46.0)
Lymphs Abs: 4.2 10*3/uL — ABNORMAL HIGH (ref 0.7–4.0)
MCHC: 32.8 g/dL (ref 30.0–36.0)
MCV: 86.8 fl (ref 78.0–100.0)
Monocytes Absolute: 0.6 10*3/uL (ref 0.1–1.0)
Monocytes Relative: 7.4 % (ref 3.0–12.0)
Neutro Abs: 3.6 10*3/uL (ref 1.4–7.7)
Neutrophils Relative %: 42.2 % — ABNORMAL LOW (ref 43.0–77.0)
Platelets: 248 10*3/uL (ref 150.0–400.0)
RBC: 4.71 Mil/uL (ref 3.87–5.11)
RDW: 15 % (ref 11.5–15.5)
WBC: 8.5 10*3/uL (ref 4.0–10.5)

## 2022-05-07 LAB — BASIC METABOLIC PANEL
BUN: 9 mg/dL (ref 6–23)
CO2: 27 mEq/L (ref 19–32)
Calcium: 9.5 mg/dL (ref 8.4–10.5)
Chloride: 103 mEq/L (ref 96–112)
Creatinine, Ser: 0.82 mg/dL (ref 0.40–1.20)
GFR: 79.82 mL/min (ref >60.00)
Glucose, Bld: 84 mg/dL (ref 70–99)
Potassium: 3.8 mEq/L (ref 3.5–5.1)
Sodium: 138 mEq/L (ref 135–145)

## 2022-05-07 LAB — TSH: TSH: 1.63 u[IU]/mL (ref 0.35–5.50)

## 2022-05-07 LAB — HEPATIC FUNCTION PANEL
ALT: 12 U/L (ref 0–35)
AST: 15 U/L (ref 0–37)
Albumin: 4.3 g/dL (ref 3.5–5.2)
Alkaline Phosphatase: 143 U/L — ABNORMAL HIGH (ref 39–117)
Bilirubin, Direct: 0.1 mg/dL (ref 0.0–0.3)
Total Bilirubin: 0.4 mg/dL (ref 0.2–1.2)
Total Protein: 7.5 g/dL (ref 6.0–8.3)

## 2022-05-07 LAB — URINALYSIS, ROUTINE W REFLEX MICROSCOPIC
Bilirubin Urine: NEGATIVE
Hgb urine dipstick: NEGATIVE
Ketones, ur: NEGATIVE
Leukocytes,Ua: NEGATIVE
Nitrite: NEGATIVE
RBC / HPF: NONE SEEN (ref 0–?)
Specific Gravity, Urine: 1.01 (ref 1.000–1.030)
Total Protein, Urine: NEGATIVE
Urine Glucose: NEGATIVE
Urobilinogen, UA: 0.2 (ref 0.0–1.0)
WBC, UA: NONE SEEN (ref 0–?)
pH: 7 (ref 5.0–8.0)

## 2022-05-07 LAB — HEMOGLOBIN A1C: Hgb A1c MFr Bld: 5.8 % (ref 4.6–6.5)

## 2022-05-07 LAB — VITAMIN D 25 HYDROXY (VIT D DEFICIENCY, FRACTURES): VITD: 51.64 ng/mL (ref 30.00–100.00)

## 2022-05-07 LAB — VITAMIN B12: Vitamin B-12: 222 pg/mL (ref 211–911)

## 2022-05-07 NOTE — Progress Notes (Unsigned)
Patient ID: Sandy Hanna, female   DOB: Jan 16, 1966, 56 y.o.   MRN: [id number]         Chief Complaint:: wellness exam and Medication Reaction (Swollen gums, muscle aches, loss in appetite, etc)  , anxiety, low vit d, hld, coronary calcium noted by CT       HPI:  Sandy Hanna is a 56 y.o. female here for wellness exam; ow up to date                        Also tried to take statin twice recently but with myalgias and less appetite.  Also has increased swelling of the gums and was suggested by dental it might be the amlodipine.  Denies worsening depressive symptoms, suicidal ideation, or panic; has ongoing anxiety, much increased recently with multiple stressors, pt is ok with referral for counseling, but declines SSRI such as celexa for now.  Taking Vit D she thinks maybe 2000 u qd.  Goal ldl < 70 given coronary calcium by CT noted.  Pt denies chest pain, increased sob or doe, wheezing, orthopnea, PND, increased LE swelling, palpitations, dizziness or syncope.   Pt denies polydipsia, polyuria, or new focal neuro s/s.    Pt denies fever, wt loss, night sweats, or other constitutional symptoms   Wt Readings from Last 3 Encounters:  05/07/22 161 lb 9.6 oz (73.3 kg)  06/23/21 168 lb (76.2 kg)  08/29/20 168 lb (76.2 kg)   BP Readings from Last 3 Encounters:  05/07/22 118/72  06/23/21 120/78  08/29/20 110/72   Immunization History  Administered Date(s) Administered   Td 11/22/2002   Tdap 03/23/2016  There are no preventive care reminders to display for this patient.    Past Medical History:  Diagnosis Date   Acute sinusitis, unspecified 12/30/2007   ALLERGIC RHINITIS 12/15/2007   Allergy    ANEMIA-NOS 12/18/2010   ANXIETY 12/15/2007   BLEPHARITIS, RIGHT 04/03/2008   CHEST PAIN 08/25/2010   DEPRESSION 12/15/2007   FATIGUE 08/25/2010   GERD 12/15/2007   HYPERLIPIDEMIA 07/03/2010   HYPERSOMNIA 12/18/2010   HYPERTENSION 06/27/2007   Irritable bowel syndrome 04/28/2009   ISCHEMIC COLITIS  04/28/2009   Migraines 12/23/2008   Neuromuscular disorder ([place])    cervical slipped disc 2020    OTITIS MEDIA, BILATERAL 09/12/2008   RECTAL BLEEDING 04/28/2009   Vitamin D deficiency 03/23/2016   Past Surgical History:  Procedure Laterality Date   BREAST BIOPSY  2005   Negative   COLONOSCOPY     UPPER GASTROINTESTINAL ENDOSCOPY  12/2007    reports that she quit smoking about 17 months ago. Her smoking use included cigarettes. She started smoking about 33 years ago. She has a 9.00 pack-year smoking history. She has never used smokeless tobacco. She reports that she does not drink alcohol and does not use drugs. family history includes Breast cancer in her mother; Diabetes in her mother and paternal grandmother; Hypertension in her mother; Ovarian cancer in her maternal uncle and another family member. Allergies  Allergen Reactions   Amlodipine Other (See Comments)    Gum swelling   Amoxicillin-Pot Clavulanate     REACTION: nausea   Clarithromycin     REACTION: Nausea   Crestor [Rosuvastatin] Other (See Comments)    bilatera shoulder pain   Doxycycline     REACTION: severe fatigue per pt   Lipitor [Atorvastatin Calcium] Other (See Comments)    Myalgias joint pain   Lisinopril Other (See Comments)  Angioedema left upper and lower lids   Lovastatin Other (See Comments)    Muscle pain   Current Outpatient Medications on File Prior to Visit  Medication Sig Dispense Refill   albuterol (VENTOLIN HFA) 108 (90 Base) MCG/ACT inhaler Inhale 2 puffs into the lungs every 4 (four) hours.     aspirin 81 MG EC tablet Take 1 tablet (81 mg total) by mouth daily. 30 tablet 99   cyclobenzaprine (FLEXERIL) 5 MG tablet cyclobenzaprine 5 mg tablet  TAKE 1 TABLET BY MOUTH THREE TIMES A DAY AS NEEDED FOR MUSCLE SPASMS     fluconazole (DIFLUCAN) 150 MG tablet 1 tab by mouth every 3 days as needed 2 tablet 1   fluticasone (FLONASE) 50 MCG/ACT nasal spray Place 2 sprays into both nostrils daily. 16 g 6    ibuprofen (ADVIL) 800 MG tablet ibuprofen 800 mg tablet  TAKE 1 TABLET BY MOUTH EVERY 8 HOURS AS NEEDED     montelukast (SINGULAIR) 10 MG tablet Take 1 tablet (10 mg total) by mouth at bedtime. 30 tablet 0   ofloxacin (OCUFLOX) 0.3 % ophthalmic solution Place 1 drop into the left eye 4 (four) times daily. 5 mL 2   pantoprazole (PROTONIX) 40 MG tablet TAKE 1 TABLET BY MOUTH EVERY DAY 90 tablet 3   ALPRAZolam (XANAX) 0.25 MG tablet Take 1 tablet (0.25 mg total) by mouth 2 (two) times daily as needed for anxiety. (Patient not taking: Reported on 05/07/2022) 40 tablet 0   amLODipine (NORVASC) 10 MG tablet TAKE 1 TABLET BY MOUTH EVERY DAY (Patient not taking: Reported on 05/07/2022) 90 tablet 3   dicyclomine (BENTYL) 20 MG tablet Take 1 tablet (20 mg total) by mouth 3 (three) times daily as needed for spasms. 90 tablet 3   No current facility-administered medications on file prior to visit.        ROS:  All others reviewed and negative.  Objective        PE:  BP 118/72 (BP Location: Right Arm, Patient Position: Sitting, Cuff Size: Large)   Pulse 77   Temp 99.1 F (37.3 C) (Oral)   Ht '5\' 2"'$  (1.575 m)   Wt 161 lb 9.6 oz (73.3 kg)   SpO2 99%   BMI 29.56 kg/m                 Constitutional: Pt appears in NAD               HENT: Head: NCAT.                Right Ear: External ear normal.                 Left Ear: External ear normal.                Eyes: . Pupils are equal, round, and reactive to light. Conjunctivae and EOM are normal               Nose: without d/c or deformity               Neck: Neck supple. Gross normal ROM               Cardiovascular: Normal rate and regular rhythm.                 Pulmonary/Chest: Effort normal and breath sounds without rales or wheezing.                Abd:  Soft,  NT, ND, + BS, no organomegaly               Neurological: Pt is alert. At baseline orientation, motor grossly intact               Skin: Skin is warm. No rashes, no other new lesions, LE  edema - none               Psychiatric: Pt behavior is normal without agitation , mod nervous  Micro: none  Cardiac tracings I have personally interpreted today:  none  Pertinent Radiological findings (summarize): none   Lab Results  Component Value Date   WBC 8.5 05/07/2022   HGB 13.4 05/07/2022   HCT 40.8 05/07/2022   PLT 248.0 05/07/2022   GLUCOSE 84 05/07/2022   CHOL 238 (H) 05/07/2022   TRIG 130.0 05/07/2022   HDL 47.50 05/07/2022   LDLDIRECT 206.8 06/25/2010   LDLCALC 164 (H) 05/07/2022   ALT 12 05/07/2022   AST 15 05/07/2022   NA 138 05/07/2022   K 3.8 05/07/2022   CL 103 05/07/2022   CREATININE 0.82 05/07/2022   BUN 9 05/07/2022   CO2 27 05/07/2022   TSH 1.63 05/07/2022   HGBA1C 5.8 05/07/2022   Assessment/Plan:  Sandy Hanna is a 56 y.o. Black or African American [2] female with  has a past medical history of Acute sinusitis, unspecified (12/30/2007), ALLERGIC RHINITIS (12/15/2007), Allergy, ANEMIA-NOS (12/18/2010), ANXIETY (12/15/2007), BLEPHARITIS, RIGHT (04/03/2008), CHEST PAIN (08/25/2010), DEPRESSION (12/15/2007), FATIGUE (08/25/2010), GERD (12/15/2007), HYPERLIPIDEMIA (07/03/2010), HYPERSOMNIA (12/18/2010), HYPERTENSION (06/27/2007), Irritable bowel syndrome (04/28/2009), ISCHEMIC COLITIS (04/28/2009), Migraines (12/23/2008), Neuromuscular disorder ([place]), OTITIS MEDIA, BILATERAL (09/12/2008), RECTAL BLEEDING (04/28/2009), and Vitamin D deficiency (03/23/2016).  Encounter for well adult exam with abnormal findings Age and sex appropriate education and counseling updated with regular exercise and diet Referrals for preventative services - none needed Immunizations addressed - none needed Smoking counseling  - none needed Evidence for depression or other mood disorder - anxiety worsening - for referal counseling Most recent labs reviewed. I have personally reviewed and have noted: 1) the patient's medical and social history 2) The patient's current medications and supplements 3)  The patient's height, weight, and BMI have been recorded in the chart   Vitamin D deficiency Last vitamin D Lab Results  Component Value Date   VD25OH 51.64 05/07/2022   Stable, cont oral replacement  Hyperlipidemia Has been statin intolerant,  Lab Results  Component Value Date   LDLCALC 164 (H) 05/07/2022   Also has trivial cor calcification by CT though, pt declines further med tx for now, for lower chol diet  Essential hypertension BP Readings from Last 3 Encounters:  05/07/22 118/72  06/23/21 120/78  08/29/20 110/72   Stable, pt to continue medical treatment norvasc 10 mg qd   Anxiety state With mild worsening recently, delcines ssri or other, for referral counseling  Followup: Return in about 1 year (around 05/08/2023).  [name], MD 05/09/2022 7:02 PM [facility]

## 2022-05-07 NOTE — Patient Instructions (Signed)
Ok to stay off all statins since your heart test was so good anyway  Please continue all other medications as before, and refills have been done if requested.  Please have the pharmacy call with any other refills you may need.  Please continue your efforts at being more active, low cholesterol diet, and weight control.  You are otherwise up to date with prevention measures today.  Please keep your appointments with your specialists as you may have planned  You will be contacted regarding the referral for: counseling  Please go to the LAB at the blood drawing area for the tests to be done  You will be contacted by phone if any changes need to be made immediately.  Otherwise, you will receive a letter about your results with an explanation, but please check with MyChart first.  Please remember to sign up for MyChart if you have not done so, as this will be important to you in the future with finding out test results, communicating by private email, and scheduling acute appointments online when needed.  Please make an Appointment to return for your 1 year visit, or sooner if needed

## 2022-05-09 ENCOUNTER — Encounter: Payer: Self-pay | Admitting: Internal Medicine

## 2022-05-09 NOTE — Assessment & Plan Note (Signed)
BP Readings from Last 3 Encounters:  05/07/22 118/72  06/23/21 120/78  08/29/20 110/72   Stable, pt to continue medical treatment norvasc 10 mg qd

## 2022-05-09 NOTE — Assessment & Plan Note (Signed)
Has been statin intolerant,  Lab Results  Component Value Date   LDLCALC 164 (H) 05/07/2022   Also has trivial cor calcification by CT though, pt declines further med tx for now, for lower chol diet

## 2022-05-09 NOTE — Assessment & Plan Note (Signed)
Last vitamin D Lab Results  Component Value Date   VD25OH 51.64 05/07/2022   Stable, cont oral replacement

## 2022-05-09 NOTE — Assessment & Plan Note (Addendum)
Age and sex appropriate education and counseling updated with regular exercise and diet Referrals for preventative services - none needed Immunizations addressed - none needed Smoking counseling  - none needed Evidence for depression or other mood disorder - anxiety worsening - for referal counseling Most recent labs reviewed. I have personally reviewed and have noted: 1) the patient's medical and social history 2) The patient's current medications and supplements 3) The patient's height, weight, and BMI have been recorded in the chart

## 2022-05-09 NOTE — Assessment & Plan Note (Signed)
With mild worsening recently, delcines ssri or other, for referral counseling

## 2022-06-22 ENCOUNTER — Telehealth: Payer: Self-pay | Admitting: Internal Medicine

## 2022-06-22 NOTE — Telephone Encounter (Signed)
Please to check BP twice per day for this week and call next Mon with results

## 2022-06-22 NOTE — Telephone Encounter (Signed)
Spoke with patient and she agrees and she will call us with results on Monday.

## 2022-06-22 NOTE — Telephone Encounter (Signed)
Please advise 

## 2022-06-22 NOTE — Telephone Encounter (Signed)
Pt stated she was taken off bp med amlodipine. She stated everything was ok until yesterday. She said her bp yesterday was 184/87. She stated she has not experienced any symptoms. She has not taken her bp today. Pt is requesting a callback letting her know what she should do about her bp since was taken off her bp rx.    Please advise

## 2022-06-24 ENCOUNTER — Encounter: Payer: No Typology Code available for payment source | Admitting: Internal Medicine

## 2022-06-25 ENCOUNTER — Telehealth: Payer: Self-pay | Admitting: Internal Medicine

## 2022-06-25 MED ORDER — LOSARTAN POTASSIUM 100 MG PO TABS: 100.0000 mg | ORAL_TABLET | Freq: Every day | ORAL | 3 refills | Status: DC

## 2022-06-25 MED ORDER — AMLODIPINE BESYLATE 10 MG PO TABS: 10.0000 mg | ORAL_TABLET | Freq: Every day | ORAL | 3 refills | Status: DC

## 2022-06-25 NOTE — Telephone Encounter (Signed)
Note not needed 

## 2022-06-25 NOTE — Telephone Encounter (Signed)
Patient notified

## 2022-06-25 NOTE — Telephone Encounter (Signed)
Ok to start losartan 100 mg qd  Tylenol prn ok for HA unless she feels she needs to go to [facility]

## 2022-06-25 NOTE — Addendum Note (Signed)
Addended by: Biagio Borg on: 06/25/2022 01:12 PM   Modules accepted: Orders

## 2022-06-25 NOTE — Telephone Encounter (Signed)
She was taken off amlodipine and her bp was 184/87 on 06/22/22. Dr. Jenny Reichmann said for her to check bp twice per day and call with results on 06/28/22. Pt stated she has had a headache for 2 days now and this morning her bp was 189/85. Pt is concerned about the headache and wants to know if she should wait until Tuesday to see Dr. Jenny Reichmann or if she needs to go to urgent care?

## 2022-07-08 ENCOUNTER — Telehealth: Payer: Self-pay | Admitting: Internal Medicine

## 2022-07-08 MED ORDER — AMLODIPINE BESYLATE 5 MG PO TABS: 5.0000 mg | ORAL_TABLET | Freq: Every day | ORAL | 3 refills | Status: DC

## 2022-07-08 NOTE — Telephone Encounter (Signed)
Ok to add the amlodipine 5 mg per day - done erx

## 2022-07-08 NOTE — Telephone Encounter (Signed)
Pt called to advise Dr Jenny Reichmann that she has been taking Losartan for 2 weeks now. She said her bp has come down some but she does not feel like it has come down enough. She said her readings are averaging between 161-169.

## 2022-07-08 NOTE — Telephone Encounter (Signed)
Does she require an appointment,please advise.

## 2022-07-09 MED ORDER — METOPROLOL SUCCINATE ER 50 MG PO TB24: 50.0000 mg | ORAL_TABLET | Freq: Every day | ORAL | 3 refills | Status: DC

## 2022-07-09 NOTE — Telephone Encounter (Signed)
Patient states that you have taken her off of amlodipine due to gum swelling, and would like to know if she should continue both losartan and amlodipine together.

## 2022-07-09 NOTE — Telephone Encounter (Signed)
See below

## 2022-07-09 NOTE — Telephone Encounter (Signed)
No sorry, we should not do the amlodipine then as this will affect the gums again  [facility] for toprol xl 50 mg per day  - done erx  Ok to continue the losartan 100 mg qd

## 2022-07-12 NOTE — Telephone Encounter (Signed)
Updated and informed her that you did not recommend taking just the amlodipine and that she should take all 3 medications. She is very adamant on doing it her way.

## 2022-07-12 NOTE — Telephone Encounter (Signed)
Oh no, very sorry, I wish it worked that way, but each medication only works so much, so we have to take all 3 to get the BP down to the right level for her;  please continue all current medications

## 2022-07-12 NOTE — Telephone Encounter (Signed)
Spoke with patient and she states that started the '5mg'$  of amlodipine and it has regulated her bp, then she will start the toprol and losartan as she does not want to take so many medications.

## 2022-07-22 ENCOUNTER — Telehealth: Payer: Self-pay | Admitting: Internal Medicine

## 2022-07-22 NOTE — Telephone Encounter (Signed)
Ok with me 

## 2022-07-22 NOTE — Telephone Encounter (Signed)
Pt called to request a TOC from   Dr. [name] at [facility]  To  Dr. Jerilee Hoh at [place] both providers okay with this transfer?  Please advise.  Respectfully,  IC

## 2022-07-23 NOTE — Telephone Encounter (Signed)
Pt scheduled for TOC on 08/04/22 afternoon

## 2022-08-04 ENCOUNTER — Ambulatory Visit: Payer: No Typology Code available for payment source | Admitting: Internal Medicine

## 2022-08-04 ENCOUNTER — Encounter: Payer: Self-pay | Admitting: Internal Medicine

## 2022-08-04 VITALS — BP 110/80 | HR 76 | Temp 98.4°F | Ht 62.0 in | Wt 161.7 lb

## 2022-08-04 DIAGNOSIS — M5416 Radiculopathy, lumbar region: Secondary | ICD-10-CM

## 2022-08-04 DIAGNOSIS — Z789 Other specified health status: Secondary | ICD-10-CM

## 2022-08-04 DIAGNOSIS — E785 Hyperlipidemia, unspecified: Secondary | ICD-10-CM

## 2022-08-04 DIAGNOSIS — R011 Cardiac murmur, unspecified: Secondary | ICD-10-CM

## 2022-08-04 DIAGNOSIS — M545 Low back pain, unspecified: Secondary | ICD-10-CM | POA: Diagnosis not present

## 2022-08-04 DIAGNOSIS — I10 Essential (primary) hypertension: Secondary | ICD-10-CM | POA: Diagnosis not present

## 2022-08-04 DIAGNOSIS — F17210 Nicotine dependence, cigarettes, uncomplicated: Secondary | ICD-10-CM

## 2022-08-04 NOTE — Progress Notes (Signed)
Established Patient Office Visit     CC/Reason for Visit: Establish care, discuss chronic and acute concerns  HPI: Sandy Hanna is a 56 y.o. female who is coming in today for the above mentioned reasons. Past Medical History is significant for: Hypertension, hyperlipidemia, coronary calcifications, GERD, IBS, ongoing nicotine dependence and degenerative disc disease of the neck.  She is a single mother, has a young grandson.  She smokes about 5 cigarettes a day and has done this her entire adult life, she does not drink alcohol, she has a statin intolerance.  No past surgical history.  Family history significant for a father with coronary artery disease in his 21s, mother with CVA paternal grandmother with CVA.  She has some acute concerns today:  1.  She is having some left-sided neck pain.  #2  She is experiencing some left lower back pain that radiates down the posterior lateral side of her left thigh.   Past Medical/Surgical History: Past Medical History:  Diagnosis Date   Acute sinusitis, unspecified 12/30/2007   ALLERGIC RHINITIS 12/15/2007   Allergy    ANEMIA-NOS 12/18/2010   ANXIETY 12/15/2007   BLEPHARITIS, RIGHT 04/03/2008   CHEST PAIN 08/25/2010   DEPRESSION 12/15/2007   FATIGUE 08/25/2010   GERD 12/15/2007   HYPERLIPIDEMIA 07/03/2010   HYPERSOMNIA 12/18/2010   HYPERTENSION 06/27/2007   Irritable bowel syndrome 04/28/2009   ISCHEMIC COLITIS 04/28/2009   Migraines 12/23/2008   Neuromuscular disorder ([place])    cervical slipped disc 2020    OTITIS MEDIA, BILATERAL 09/12/2008   RECTAL BLEEDING 04/28/2009   Vitamin D deficiency 03/23/2016    Past Surgical History:  Procedure Laterality Date   BREAST BIOPSY  2005   Negative   COLONOSCOPY     UPPER GASTROINTESTINAL ENDOSCOPY  12/2007    Social History:  reports that she has been smoking cigarettes. She started smoking about 33 years ago. She has a 24.00 pack-year smoking history. She has never used smokeless tobacco. She  reports that she does not drink alcohol and does not use drugs.  Allergies: Allergies  Allergen Reactions   Amlodipine Other (See Comments)    Gum swelling   Amoxicillin-Pot Clavulanate     REACTION: nausea   Clarithromycin     REACTION: Nausea   Crestor [Rosuvastatin] Other (See Comments)    bilatera shoulder pain   Doxycycline     REACTION: severe fatigue per pt   Lipitor [Atorvastatin Calcium] Other (See Comments)    Myalgias joint pain   Lisinopril Other (See Comments)    Angioedema left upper and lower lids   Lovastatin Other (See Comments)    Muscle pain    Family History:  Family History  Problem Relation Age of Onset   Breast cancer Mother        age 58-65   Hypertension Mother    Diabetes Mother    Diabetes Paternal Grandmother    Ovarian cancer Other        Aunt   Ovarian cancer Maternal Uncle    Colon cancer Neg Hx    Colon polyps Neg Hx    Esophageal cancer Neg Hx    Rectal cancer Neg Hx    Stomach cancer Neg Hx      Current Outpatient Medications:    albuterol (VENTOLIN HFA) 108 (90 Base) MCG/ACT inhaler, Inhale 2 puffs into the lungs every 4 (four) hours., Disp: , Rfl:    ALPRAZolam (XANAX) 0.25 MG tablet, Take 1 tablet (0.25 mg total)  by mouth 2 (two) times daily as needed for anxiety., Disp: 40 tablet, Rfl: 0   aspirin 81 MG EC tablet, Take 1 tablet (81 mg total) by mouth daily., Disp: 30 tablet, Rfl: 99   cyclobenzaprine (FLEXERIL) 5 MG tablet, cyclobenzaprine 5 mg tablet  TAKE 1 TABLET BY MOUTH THREE TIMES A DAY AS NEEDED FOR MUSCLE SPASMS, Disp: , Rfl:    dicyclomine (BENTYL) 20 MG tablet, Take 1 tablet (20 mg total) by mouth 3 (three) times daily as needed for spasms., Disp: 90 tablet, Rfl: 3   fluconazole (DIFLUCAN) 150 MG tablet, 1 tab by mouth every 3 days as needed, Disp: 2 tablet, Rfl: 1   fluticasone (FLONASE) 50 MCG/ACT nasal spray, Place 2 sprays into both nostrils daily., Disp: 16 g, Rfl: 6   ibuprofen (ADVIL) 800 MG tablet, ibuprofen 800  mg tablet  TAKE 1 TABLET BY MOUTH EVERY 8 HOURS AS NEEDED, Disp: , Rfl:    losartan (COZAAR) 100 MG tablet, Take 1 tablet (100 mg total) by mouth daily., Disp: 90 tablet, Rfl: 3   montelukast (SINGULAIR) 10 MG tablet, Take 1 tablet (10 mg total) by mouth at bedtime., Disp: 30 tablet, Rfl: 0   ofloxacin (OCUFLOX) 0.3 % ophthalmic solution, Place 1 drop into the left eye 4 (four) times daily., Disp: 5 mL, Rfl: 2   pantoprazole (PROTONIX) 40 MG tablet, TAKE 1 TABLET BY MOUTH EVERY DAY, Disp: 90 tablet, Rfl: 3  Review of Systems:  Constitutional: Denies fever, chills, diaphoresis, appetite change and fatigue.  HEENT: Denies photophobia, eye pain, redness, hearing loss, ear pain, congestion, sore throat, rhinorrhea, sneezing, mouth sores, trouble swallowing, neck pain, neck stiffness and tinnitus.   Respiratory: Denies SOB, DOE, cough, chest tightness,  and wheezing.   Cardiovascular: Denies chest pain, palpitations and leg swelling.  Gastrointestinal: Denies nausea, vomiting, abdominal pain, diarrhea, constipation, blood in stool and abdominal distention.  Genitourinary: Denies dysuria, urgency, frequency, hematuria, flank pain and difficulty urinating.  Endocrine: Denies: hot or cold intolerance, sweats, changes in hair or nails, polyuria, polydipsia. Musculoskeletal: Denies myalgias, back pain, joint swelling, arthralgias and gait problem.  Skin: Denies pallor, rash and wound.  Neurological: Denies dizziness, seizures, syncope, weakness, light-headedness, and headaches.  Hematological: Denies adenopathy. Easy bruising, personal or family bleeding history  Psychiatric/Behavioral: Denies suicidal ideation, mood changes, confusion, nervousness, sleep disturbance and agitation    Physical Exam: Vitals:   08/04/22 1408  BP: 110/80  Pulse: 76  Temp: 98.4 F (36.9 C)  TempSrc: Oral  SpO2: 99%  Weight: 161 lb 11.2 oz (73.3 kg)  Height: '5\' 2"'$  (1.575 m)    Body mass index is 29.58  kg/m.   Constitutional: NAD, calm, comfortable Eyes: PERRL, lids and conjunctivae normal ENMT: Mucous membranes are moist.  Respiratory: clear to auscultation bilaterally, no wheezing, no crackles. Normal respiratory effort. No accessory muscle use.  Cardiovascular: Regular rate and rhythm, positive systolic ejection murmur best heard at the right upper sternal border, no rubs / gallops. No extremity edema.   Psychiatric: Normal judgment and insight. Alert and oriented x 3. Normal mood.    Impression and Plan:  Hyperlipidemia, unspecified hyperlipidemia type - Plan: AMB Referral to [facility]  Essential hypertension  Low back pain, unspecified back pain laterality, unspecified chronicity, unspecified whether sciatica present  Left lumbar radiculopathy  Systolic murmur - Plan: ECHOCARDIOGRAM COMPLETE  Statin intolerance - Plan: AMB Referral to [facility]  Cigarette nicotine dependence without complication  -Last lipid panel in  June 2023 with a total cholesterol of 238, triglycerides 130 and LDL 164.  With an elevated coronary calcium score and significant family history she would benefit from statin therapy, however she has had intolerance to multiple statins in the past.  I will refer to lipid management clinic to see about PCSK9 or inclisiran therapy. -She would definitely benefit from smoking cessation, however we have not had much time to discuss this today. -She is only taking amlodipine and losartan for now for blood pressure, okay to hold off on metoprolol. -I have auscultated a heart murmur today, this has never been documented in her chart before, check 2D echo. -I suspect her left leg numbness and low back pain is related to some sciatica.  We will start with back stretches, icing, local massage therapy and as needed NSAIDs.  Can refer to physical therapy and provide muscle relaxers if no improvement.  Time spent:33 minutes  reviewing chart, interviewing and examining patient and formulating plan of care.     Lelon Frohlich, MD [facility] at [facility]

## 2022-08-06 ENCOUNTER — Telehealth: Payer: Self-pay | Admitting: Internal Medicine

## 2022-08-06 NOTE — Telephone Encounter (Signed)
Ms. Zigmund Daniel from the [place] Fax [phone]  Calling to request a prior authorization for the echocardiogram.  Please advise.

## 2022-08-13 ENCOUNTER — Other Ambulatory Visit: Payer: Self-pay | Admitting: Internal Medicine

## 2022-08-13 NOTE — Telephone Encounter (Signed)
Ok to pcp please °

## 2022-09-03 ENCOUNTER — Ambulatory Visit (HOSPITAL_COMMUNITY): Payer: No Typology Code available for payment source | Attending: Internal Medicine

## 2022-09-03 DIAGNOSIS — I358 Other nonrheumatic aortic valve disorders: Secondary | ICD-10-CM | POA: Diagnosis not present

## 2022-09-03 DIAGNOSIS — R011 Cardiac murmur, unspecified: Secondary | ICD-10-CM | POA: Diagnosis not present

## 2022-09-03 LAB — ECHOCARDIOGRAM COMPLETE
Area-P 1/2: 4.31 cm2
S' Lateral: 1.8 cm

## 2022-09-24 ENCOUNTER — Encounter: Payer: Self-pay | Admitting: Family Medicine

## 2022-09-24 ENCOUNTER — Ambulatory Visit: Payer: No Typology Code available for payment source | Admitting: Family Medicine

## 2022-09-24 VITALS — BP 126/66 | HR 74 | Temp 98.1°F | Ht 62.0 in | Wt 164.8 lb

## 2022-09-24 DIAGNOSIS — M542 Cervicalgia: Secondary | ICD-10-CM | POA: Diagnosis not present

## 2022-09-24 MED ORDER — PREDNISONE 20 MG PO TABS: ORAL_TABLET | ORAL | 0 refills | Status: DC

## 2022-09-24 NOTE — Patient Instructions (Signed)
Be in touch in one week if no better.

## 2022-09-24 NOTE — Progress Notes (Signed)
Established Patient Office Visit  Subjective   Patient ID: Sandy Hanna, female    DOB: November 24, 1965  Age: 56 y.o. MRN: [id number]  Chief Complaint  Patient presents with   Sciatica    Patient complains of sciatica, x2 weeks     HPI   Seen with left-sided neck pain.  She had similar flareup about 5 years ago.  Denies any recent injury.  She had 2 weeks of occasional sharp pains left mid to lower neck somewhat poorly localized with some radiation toward the shoulder.  She has tried different positions at night without relief.  Has tried over-the-counter analgesics such as Tylenol without relief.  Pain seem to be worse lying down.  She has a 31-year-old grandson that she picks up frequently and she thinks this may have exacerbated.  She is right-hand dominant.  Denies any upper extremity weakness or numbness.  She recalls being treated with prednisone back in 2018 which seemed to help.  Past Medical History:  Diagnosis Date   Acute sinusitis, unspecified 12/30/2007   ALLERGIC RHINITIS 12/15/2007   Allergy    ANEMIA-NOS 12/18/2010   ANXIETY 12/15/2007   BLEPHARITIS, RIGHT 04/03/2008   CHEST PAIN 08/25/2010   DEPRESSION 12/15/2007   FATIGUE 08/25/2010   GERD 12/15/2007   HYPERLIPIDEMIA 07/03/2010   HYPERSOMNIA 12/18/2010   HYPERTENSION 06/27/2007   Irritable bowel syndrome 04/28/2009   ISCHEMIC COLITIS 04/28/2009   Migraines 12/23/2008   Neuromuscular disorder ([place])    cervical slipped disc 2020    OTITIS MEDIA, BILATERAL 09/12/2008   RECTAL BLEEDING 04/28/2009   Vitamin D deficiency 03/23/2016   Past Surgical History:  Procedure Laterality Date   BREAST BIOPSY  2005   Negative   COLONOSCOPY     UPPER GASTROINTESTINAL ENDOSCOPY  12/2007    reports that she has been smoking cigarettes. She started smoking about 33 years ago. She has a 24.00 pack-year smoking history. She has never used smokeless tobacco. She reports that she does not drink alcohol and does not use drugs. family history includes  Breast cancer in her mother; Diabetes in her mother and paternal grandmother; Hypertension in her mother; Ovarian cancer in her maternal uncle and another family member. Allergies  Allergen Reactions   Amlodipine Other (See Comments)    Gum swelling   Amoxicillin-Pot Clavulanate     REACTION: nausea   Clarithromycin     REACTION: Nausea   Crestor [Rosuvastatin] Other (See Comments)    bilatera shoulder pain   Doxycycline     REACTION: severe fatigue per pt   Lipitor [Atorvastatin Calcium] Other (See Comments)    Myalgias joint pain   Lisinopril Other (See Comments)    Angioedema left upper and lower lids   Lovastatin Other (See Comments)    Muscle pain    Review of Systems  Cardiovascular:  Negative for chest pain.  Musculoskeletal:  Positive for neck pain.  Neurological:  Negative for tingling and weakness.      Objective:     BP 126/66 (BP Location: Left Arm, Patient Position: Sitting, Cuff Size: Large)   Pulse 74   Temp 98.1 F (36.7 C) (Oral)   Ht '5\' 2"'$  (1.575 m)   Wt 164 lb 12.8 oz (74.8 kg)   SpO2 100%   BMI 30.14 kg/m    Physical Exam Vitals reviewed.  Constitutional:      Appearance: Normal appearance.  Cardiovascular:     Rate and Rhythm: Normal rate and regular rhythm.  Pulmonary:  Effort: Pulmonary effort is normal.     Breath sounds: Normal breath sounds.  Neurological:     General: No focal deficit present.     Mental Status: She is alert.     Cranial Nerves: No cranial nerve deficit.     Motor: No weakness.     Comments: Deep tendon reflexes are 2+ throughout upper extremities.  Full strength throughout.      No results found for any visits on 09/24/22.    The 10-year ASCVD risk score (Arnett DK, et al., 2019) is: 13.6%    Assessment & Plan:   Left-sided neck pain.  Nonfocal neuro exam.  Question cervical radiculitis.  Recommend trial of prednisone taper starting at 60 mg daily.  Reviewed potential side effects.  May supplement  with Tylenol as needed.  Touch base in a couple weeks if not improving  No follow-ups on file.    Carolann Littler, MD

## 2022-10-04 ENCOUNTER — Ambulatory Visit: Payer: No Typology Code available for payment source | Admitting: Internal Medicine

## 2022-10-06 ENCOUNTER — Ambulatory Visit: Payer: No Typology Code available for payment source | Admitting: Internal Medicine

## 2022-10-06 ENCOUNTER — Encounter: Payer: Self-pay | Admitting: Internal Medicine

## 2022-10-06 VITALS — BP 120/84 | HR 85 | Temp 98.1°F | Wt 165.7 lb

## 2022-10-06 DIAGNOSIS — M542 Cervicalgia: Secondary | ICD-10-CM

## 2022-10-06 MED ORDER — CYCLOBENZAPRINE HCL 5 MG PO TABS: 5.0000 mg | ORAL_TABLET | Freq: Two times a day (BID) | ORAL | 1 refills | Status: DC | PRN

## 2022-10-06 NOTE — Progress Notes (Signed)
Established Patient Office Visit     CC/Reason for Visit: Left-sided neck and shoulder pain  HPI: Sandy Hanna is a 56 y.o. female who is coming in today for the above mentioned reasons.  She saw another provider in the office recently for the same.  Continues to have same complaints.  Pain is over left side of neck shoulder and upper back.  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  She has no pain, numbness or tingling into her left arm or fingertips.  No headaches.   Past Medical/Surgical History: Past Medical History:  Diagnosis Date   Acute sinusitis, unspecified 12/30/2007   ALLERGIC RHINITIS 12/15/2007   Allergy    ANEMIA-NOS 12/18/2010   ANXIETY 12/15/2007   BLEPHARITIS, RIGHT 04/03/2008   CHEST PAIN 08/25/2010   DEPRESSION 12/15/2007   FATIGUE 08/25/2010   GERD 12/15/2007   HYPERLIPIDEMIA 07/03/2010   HYPERSOMNIA 12/18/2010   HYPERTENSION 06/27/2007   Irritable bowel syndrome 04/28/2009   ISCHEMIC COLITIS 04/28/2009   Migraines 12/23/2008   Neuromuscular disorder ([place])    cervical slipped disc 2020    OTITIS MEDIA, BILATERAL 09/12/2008   RECTAL BLEEDING 04/28/2009   Vitamin D deficiency 03/23/2016    Past Surgical History:  Procedure Laterality Date   BREAST BIOPSY  2005   Negative   COLONOSCOPY     UPPER GASTROINTESTINAL ENDOSCOPY  12/2007    Social History:  reports that she has been smoking cigarettes. She started smoking about 33 years ago. She has a 24.00 pack-year smoking history. She has never used smokeless tobacco. She reports that she does not drink alcohol and does not use drugs.  Allergies: Allergies  Allergen Reactions   Amlodipine Other (See Comments)    Gum swelling   Amoxicillin-Pot Clavulanate     REACTION: nausea   Clarithromycin     REACTION: Nausea   Crestor [Rosuvastatin] Other (See Comments)    bilatera shoulder pain   Doxycycline     REACTION: severe fatigue per pt   Lipitor [Atorvastatin Calcium] Other  (See Comments)    Myalgias joint pain   Lisinopril Other (See Comments)    Angioedema left upper and lower lids   Lovastatin Other (See Comments)    Muscle pain    Family History:  Family History  Problem Relation Age of Onset   Breast cancer Mother        age 2-65   Hypertension Mother    Diabetes Mother    Diabetes Paternal Grandmother    Ovarian cancer Other        Aunt   Ovarian cancer Maternal Uncle    Colon cancer Neg Hx    Colon polyps Neg Hx    Esophageal cancer Neg Hx    Rectal cancer Neg Hx    Stomach cancer Neg Hx      Current Outpatient Medications:    albuterol (VENTOLIN HFA) 108 (90 Base) MCG/ACT inhaler, Inhale 2 puffs into the lungs every 4 (four) hours., Disp: , Rfl:    ALPRAZolam (XANAX) 0.25 MG tablet, Take 1 tablet (0.25 mg total) by mouth 2 (two) times daily as needed for anxiety., Disp: 40 tablet, Rfl: 0   aspirin 81 MG EC tablet, Take 1 tablet (81 mg total) by mouth daily., Disp: 30 tablet, Rfl: 99   cyclobenzaprine (FLEXERIL) 5 MG tablet, Take 1 tablet (5 mg total) by mouth 2 (two) times daily as needed for muscle spasms., Disp: 60 tablet, Rfl:  1   dicyclomine (BENTYL) 20 MG tablet, Take 1 tablet (20 mg total) by mouth 3 (three) times daily as needed for spasms., Disp: 90 tablet, Rfl: 3   fluconazole (DIFLUCAN) 150 MG tablet, 1 tab by mouth every 3 days as needed, Disp: 2 tablet, Rfl: 1   fluticasone (FLONASE) 50 MCG/ACT nasal spray, Place 2 sprays into both nostrils daily., Disp: 16 g, Rfl: 6   ibuprofen (ADVIL) 800 MG tablet, ibuprofen 800 mg tablet  TAKE 1 TABLET BY MOUTH EVERY 8 HOURS AS NEEDED, Disp: , Rfl:    losartan (COZAAR) 100 MG tablet, Take 1 tablet (100 mg total) by mouth daily., Disp: 90 tablet, Rfl: 3   montelukast (SINGULAIR) 10 MG tablet, Take 1 tablet (10 mg total) by mouth at bedtime., Disp: 30 tablet, Rfl: 0   ofloxacin (OCUFLOX) 0.3 % ophthalmic solution, Place 1 drop into the left eye 4 (four) times daily., Disp: 5 mL, Rfl: 2    pantoprazole (PROTONIX) 40 MG tablet, TAKE 1 TABLET BY MOUTH EVERY DAY, Disp: 90 tablet, Rfl: 3  Review of Systems:  Constitutional: Denies fever, chills, diaphoresis, appetite change and fatigue.  HEENT: Denies photophobia, eye pain, redness, hearing loss, ear pain, congestion, sore throat, rhinorrhea, sneezing, mouth sores, trouble swallowing, neck stiffness and tinnitus.   Respiratory: Denies SOB, DOE, cough, chest tightness,  and wheezing.   Cardiovascular: Denies chest pain, palpitations and leg swelling.  Gastrointestinal: Denies nausea, vomiting, abdominal pain, diarrhea, constipation, blood in stool and abdominal distention.  Genitourinary: Denies dysuria, urgency, frequency, hematuria, flank pain and difficulty urinating.  Endocrine: Denies: hot or cold intolerance, sweats, changes in hair or nails, polyuria, polydipsia. Musculoskeletal: Denies  joint swelling, arthralgias and gait problem.  Skin: Denies pallor, rash and wound.  Neurological: Denies dizziness, seizures, syncope, weakness, light-headedness, numbness and headaches.  Hematological: Denies adenopathy. Easy bruising, personal or family bleeding history  Psychiatric/Behavioral: Denies suicidal ideation, mood changes, confusion, nervousness, sleep disturbance and agitation    Physical Exam: Vitals:   10/06/22 0837  BP: 120/84  Pulse: 85  Temp: 98.1 F (36.7 C)  TempSrc: Oral  SpO2: 95%  Weight: 165 lb 11.2 oz (75.2 kg)    Body mass index is 30.31 kg/m.   Constitutional: NAD, calm, comfortable Eyes: PERRL, lids and conjunctivae normal ENMT: Mucous membranes are moist.   Psychiatric: Normal judgment and insight. Alert and oriented x 3. Normal mood.    Impression and Plan:  Neck pain on left side - Plan: cyclobenzaprine (FLEXERIL) 5 MG tablet, Ambulatory referral to Physical Therapy  -No radiculopathy, sounds muscular in origin, she works from home on the computer so likely postural. -She already did a  course of prednisone which helped somewhat, I will send in some muscle relaxers and start physical therapy.  She has also been advised to schedule a local massage as well as icing that area frequently throughout the day.  Time spent:31 minutes reviewing chart, interviewing and examining patient and formulating plan of care.       Lelon Frohlich, MD [facility] at [facility]

## 2022-10-13 ENCOUNTER — Other Ambulatory Visit: Payer: Self-pay

## 2022-10-13 ENCOUNTER — Encounter: Payer: Self-pay | Admitting: Rehabilitative and Restorative Service Providers"

## 2022-10-13 ENCOUNTER — Ambulatory Visit
Payer: No Typology Code available for payment source | Attending: Internal Medicine | Admitting: Rehabilitative and Restorative Service Providers"

## 2022-10-13 DIAGNOSIS — M6281 Muscle weakness (generalized): Secondary | ICD-10-CM | POA: Diagnosis present

## 2022-10-13 DIAGNOSIS — M542 Cervicalgia: Secondary | ICD-10-CM | POA: Diagnosis not present

## 2022-10-13 DIAGNOSIS — R252 Cramp and spasm: Secondary | ICD-10-CM | POA: Diagnosis present

## 2022-10-13 NOTE — Patient Instructions (Signed)

## 2022-10-13 NOTE — Therapy (Signed)
OUTPATIENT PHYSICAL THERAPY CERVICAL EVALUATION   Patient Name: Sandy Hanna MRN: [id number] DOB:Apr 21, 1966, 56 y.o., female Today's Date: 10/13/2022  END OF SESSION:  PT End of Session - 10/13/22 0854     Visit Number 1    Date for PT Re-Evaluation 12/03/22    Authorization Type [employer]    PT Start Time 0846    PT Stop Time 0925    PT Time Calculation (min) 39 min    Activity Tolerance Patient tolerated treatment well    Behavior During Therapy [facility] for tasks assessed/performed             Past Medical History:  Diagnosis Date   Acute sinusitis, unspecified 12/30/2007   ALLERGIC RHINITIS 12/15/2007   Allergy    ANEMIA-NOS 12/18/2010   ANXIETY 12/15/2007   BLEPHARITIS, RIGHT 04/03/2008   CHEST PAIN 08/25/2010   DEPRESSION 12/15/2007   FATIGUE 08/25/2010   GERD 12/15/2007   HYPERLIPIDEMIA 07/03/2010   HYPERSOMNIA 12/18/2010   HYPERTENSION 06/27/2007   Irritable bowel syndrome 04/28/2009   ISCHEMIC COLITIS 04/28/2009   Migraines 12/23/2008   Neuromuscular disorder ([place])    cervical slipped disc 2020    OTITIS MEDIA, BILATERAL 09/12/2008   RECTAL BLEEDING 04/28/2009   Vitamin D deficiency 03/23/2016   Past Surgical History:  Procedure Laterality Date   BREAST BIOPSY  2005   Negative   COLONOSCOPY     UPPER GASTROINTESTINAL ENDOSCOPY  12/2007   Patient Active Problem List   Diagnosis Date Noted   Nasal septal deviation 04/02/2022   Elevated coronary artery calcium score 11/19/2021   Statin myopathy 06/23/2021   Hordeolum internum of left lower eyelid 08/30/2019   Neck pain 07/28/2019   Paresthesias 02/09/2019   Dacryocystitis 10/16/2018   Back pain 06/27/2018   Chest pain 06/27/2018   Corn of toe 05/16/2018   Acute upper respiratory infection 03/21/2018   Nausea 03/21/2018   Arthritis of metatarsophalangeal (MTP) joint of great toe 07/24/2017   Angio-edema 05/17/2017   Left foot pain 05/17/2017   Left arm pain 05/17/2017   Acute gouty arthritis 03/24/2017   Urinary  frequency 02/09/2017   Left lumbar radiculopathy 02/09/2017   Fever 02/09/2017   Low back pain 01/13/2017   Bilateral carpal tunnel syndrome 01/13/2017   Vitamin D deficiency 03/23/2016   Anemia, iron deficiency 03/23/2016   Acute sinus infection 08/21/2015   Abdominal pain 12/20/2014   Paresthesia 05/11/2012   Rash 01/23/2012   Constipation 08/10/2011   Encounter for well adult exam with abnormal findings 05/05/2011   HYPERSOMNIA 12/18/2010   FATIGUE 08/25/2010   Hyperlipidemia 07/03/2010   SHOULDER PAIN, RIGHT 05/28/2009   ISCHEMIC COLITIS 04/28/2009   Irritable bowel syndrome 04/28/2009   RECTAL BLEEDING 04/28/2009   COMMON MIGRAINE 12/23/2008   Anxiety state 12/15/2007   Depression 12/15/2007   Allergic rhinitis 12/15/2007   GERD 12/15/2007   Essential hypertension 06/27/2007    PCP: Isaac Bliss, Rayford Halsted, MD  REFERRING PROVIDER: Isaac Bliss, Rayford Halsted, MD  REFERRING DIAG: M54.2 (ICD-10-CM) - Neck pain on left side  THERAPY DIAG:  Cervicalgia - Plan: PT plan of care cert/re-cert  Cramp and spasm - Plan: PT plan of care cert/re-cert  Muscle weakness (generalized) - Plan: PT plan of care cert/re-cert  Rationale for Evaluation and Treatment: Rehabilitation  ONSET DATE: 3 months  SUBJECTIVE:  SUBJECTIVE STATEMENT: Pt reports that she has degenerative disc disease.  Reports that in 2020, she had a cervical slipped disc.  Pt states that last time, the pain radiated down her left arm, but this time, the pain is more centralized on the left side of her neck.  Pt states that she is also having some pain in her low back.  She states that neck pain is the most problematic.  Unknown mechanism of injury.  Pt reports that she has tried different pillows to see if one of them  will help her neck pain more.  PERTINENT HISTORY:  migraines, HTN, cervical slipped disc in 2020  PAIN:  Are you having pain? Yes: NPRS scale: 5-7/10 Pain location: left side of cervical region Pain description: sharp and sometimes dull, "always sore" Aggravating factors: movement Relieving factors: medication  PRECAUTIONS: None  WEIGHT BEARING RESTRICTIONS: No  FALLS:  Has patient fallen in last 6 months? 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  NEXT MD VISIT: as needed  OBJECTIVE:   DIAGNOSTIC FINDINGS:  Reports that she had imaging back in 2020 and it revealed degenerative disc disease and a slipped disc  PATIENT SURVEYS:  10/13/22:  FOTO 43% (projected 56% by visit 12)  COGNITION: Overall cognitive status: Within functional limits for tasks assessed  SENSATION: WFL  POSTURE: rounded shoulders and forward head  PALPATION: Tight musculature/muscle spasms noted along left cervical paraspinals and upper trap. Tender to palpation along left cervical region.  CERVICAL ROM:   Active ROM A/ROM (deg) eval  Flexion 50  Extension 35 with pain  Right lateral flexion 25  Left lateral flexion 25  Right rotation 40  Left rotation 30   (Blank rows = not tested)  UPPER EXTREMITY ROM:  WFL  UPPER EXTREMITY MMT: 10/13/2022:  Right shoulder 4+/5, Left shoulder 4/5  FUNCTIONAL TESTS:  5 times sit to stand: 16.1 sec with min UE use  TODAY'S TREATMENT:                                                                                                                              DATE: 10/13/2022 Reviewed HEP:   cervical retraction, scapular retraction, cervical rotation, and cervical extension x10 each  Trigger Point Dry-Needling  Treatment instructions: Expect mild to moderate muscle soreness. S/S of pneumothorax if dry needled over a lung field, and to seek immediate medical attention should they occur. Patient verbalized understanding of these instructions and education. Patient Consent Given: Yes Education handout provided: Yes Muscles treated: left cervical multifidi, left upper trap Electrical stimulation performed: No Parameters: N/A Treatment response/outcome: Utilized skilled  palpation to identify trigger points.  Able to illicit twitch response and muscle elongation following.   PATIENT EDUCATION:  Education details: Issued HEP and educated on dry needling Person educated: Patient Education method: Consulting civil engineer, Media planner, and Handouts Education comprehension: verbalized understanding and returned demonstration  HOME EXERCISE PROGRAM: Access Code: [email or web address] URL: [email or web address] Date: 10/13/2022 Prepared by: Juel Burrow  Exercises - Seated Scapular Retraction  - 1 x daily - 7 x weekly - 2 sets - 10 reps - Seated Cervical Retraction  - 1 x daily - 7 x weekly - 2 sets - 10 reps - Seated Cervical Rotation AROM  - 1 x daily - 7 x weekly - 2 sets - 10 reps - Seated Cervical Extension AROM  - 1 x daily - 7 x weekly - 2 sets - 10 reps  ASSESSMENT:  CLINICAL IMPRESSION: Patient is a 56 y.o. female who was seen today for physical therapy evaluation and treatment for left sided neck pain. Pts PLOF is independent and able to complete desired activities without pain.  Over the past 3 months, pt has noted increased left sided cervical pain, denies radicular symptoms down her left UE.  Pt presents with muscle spasms, UE weakness, decreased cervical A/ROM, and cervical pain with abnormal posture noted.  Pt reported a loosening of muscles following dry needling and stated  increased ease with cervical A/ROM.  Pt would benefit from skilled PT to address her functional impairments to allow her to return to a decreased pain lifestyle.  OBJECTIVE IMPAIRMENTS: decreased ROM, decreased strength, increased muscle spasms, postural dysfunction, and pain.   ACTIVITY LIMITATIONS: carrying, lifting, and sleeping  PARTICIPATION LIMITATIONS: cleaning, community activity, and occupation  PERSONAL FACTORS: Time since onset of injury/illness/exacerbation and 1 comorbidity: Hx of cervical "slipped disc" in 2020  are also affecting patient's functional outcome.   REHAB POTENTIAL: Good  CLINICAL DECISION MAKING: Stable/uncomplicated  EVALUATION COMPLEXITY: Low   GOALS: Goals reviewed with patient? Yes  SHORT TERM GOALS: Target date: 11/03/2022  Pt will be independent with initial HEP. Baseline:  Goal status: INITIAL  2.  Pt will report at least a 30% improvement in pain during crafting. Baseline: 7/10 Goal status: INITIAL   LONG TERM GOALS: Target date: 12/03/2022  Pt will be independent with advanced HEP. Baseline:  Goal status: INITIAL  2.  Pt will increase FOTO to at least 56% to demonstrate improvements in functional tasks. Baseline: 43% Goal status: INITIAL  3.  Pt will increase bilat UE strength to at least 4+/5 to allow her to lift objects with improved ease. Baseline: L shoulder 4/5 Goal status: INITIAL  4.  Pt will report being able to roll over in bed with cervical/back pain no greater than 2/10. Baseline: 7/10 Goal status: INITIAL    PLAN:  PT FREQUENCY: 2x/week  PT DURATION: 8 weeks  PLANNED INTERVENTIONS: Therapeutic exercises, Therapeutic activity, Neuromuscular re-education, Balance training, Gait training, Patient/Family education, Self Care, Joint mobilization, Joint manipulation, Stair training, Aquatic Therapy, Dry Needling, Spinal manipulation, Spinal mobilization, Cryotherapy, Moist heat, Taping, Traction, Ultrasound,  Ionotophoresis '4mg'$ /ml Dexamethasone, Manual therapy, and Re-evaluation  PLAN FOR NEXT SESSION: assess response to dry needling, manual/dry needling as indicated, strengthening   Juel Burrow, PT 10/13/2022, 10:37 AM  [facility] [address] [place], [place] [address] Phone # [phone] Fax [phone]

## 2022-10-21 ENCOUNTER — Ambulatory Visit: Payer: No Typology Code available for payment source | Admitting: Physical Therapy

## 2022-10-28 ENCOUNTER — Ambulatory Visit: Payer: No Typology Code available for payment source | Attending: Internal Medicine

## 2022-10-28 DIAGNOSIS — R252 Cramp and spasm: Secondary | ICD-10-CM

## 2022-10-28 DIAGNOSIS — M542 Cervicalgia: Secondary | ICD-10-CM | POA: Diagnosis present

## 2022-10-28 DIAGNOSIS — M6281 Muscle weakness (generalized): Secondary | ICD-10-CM | POA: Diagnosis present

## 2022-10-28 NOTE — Therapy (Signed)
OUTPATIENT PHYSICAL THERAPY CERVICAL EVALUATION   Patient Name: Sandy Hanna MRN: [id number] DOB:[birth year], 56 y.o., female Today's Date: 10/28/2022  END OF SESSION:  PT End of Session - 10/28/22 1143     Visit Number 2    Date for PT Re-Evaluation 12/03/22    Authorization Type [employer]    PT Start Time 1106    PT Stop Time 1143    PT Time Calculation (min) 37 min    Activity Tolerance Patient tolerated treatment well    Behavior During Therapy WFL for tasks assessed/performed              Past Medical History:  Diagnosis Date   Acute sinusitis, unspecified 12/30/2007   ALLERGIC RHINITIS 12/15/2007   Allergy    ANEMIA-NOS 12/18/2010   ANXIETY 12/15/2007   BLEPHARITIS, RIGHT 04/03/2008   CHEST PAIN 08/25/2010   DEPRESSION 12/15/2007   FATIGUE 08/25/2010   GERD 12/15/2007   HYPERLIPIDEMIA 07/03/2010   HYPERSOMNIA 12/18/2010   HYPERTENSION 06/27/2007   Irritable bowel syndrome 04/28/2009   ISCHEMIC COLITIS 04/28/2009   Migraines 12/23/2008   Neuromuscular disorder ([place])    cervical slipped disc 2020    OTITIS MEDIA, BILATERAL 09/12/2008   RECTAL BLEEDING 04/28/2009   Vitamin D deficiency 03/23/2016   Past Surgical History:  Procedure Laterality Date   BREAST BIOPSY  2005   Negative   COLONOSCOPY     UPPER GASTROINTESTINAL ENDOSCOPY  12/2007   Patient Active Problem List   Diagnosis Date Noted   Nasal septal deviation 04/02/2022   Elevated coronary artery calcium score 11/19/2021   Statin myopathy 06/23/2021   Hordeolum internum of left lower eyelid 08/30/2019   Neck pain 07/28/2019   Paresthesias 02/09/2019   Dacryocystitis 10/16/2018   Back pain 06/27/2018   Chest pain 06/27/2018   Corn of toe 05/16/2018   Acute upper respiratory infection 03/21/2018   Nausea 03/21/2018   Arthritis of metatarsophalangeal (MTP) joint of great toe 07/24/2017   Angio-edema 05/17/2017   Left foot pain 05/17/2017   Left arm pain 05/17/2017   Acute gouty arthritis 03/24/2017   Urinary  frequency 02/09/2017   Left lumbar radiculopathy 02/09/2017   Fever 02/09/2017   Low back pain 01/13/2017   Bilateral carpal tunnel syndrome 01/13/2017   Vitamin D deficiency 03/23/2016   Anemia, iron deficiency 03/23/2016   Acute sinus infection 08/21/2015   Abdominal pain 12/20/2014   Paresthesia 05/11/2012   Rash 01/23/2012   Constipation 08/10/2011   Encounter for well adult exam with abnormal findings 05/05/2011   HYPERSOMNIA 12/18/2010   FATIGUE 08/25/2010   Hyperlipidemia 07/03/2010   SHOULDER PAIN, RIGHT 05/28/2009   ISCHEMIC COLITIS 04/28/2009   Irritable bowel syndrome 04/28/2009   RECTAL BLEEDING 04/28/2009   COMMON MIGRAINE 12/23/2008   Anxiety state 12/15/2007   Depression 12/15/2007   Allergic rhinitis 12/15/2007   GERD 12/15/2007   Essential hypertension 06/27/2007    PCP: Isaac Bliss, Rayford Halsted, MD  REFERRING PROVIDER: Isaac Bliss, Rayford Halsted, MD  REFERRING DIAG: M54.2 (ICD-10-CM) - Neck pain on left side  THERAPY DIAG:  Cervicalgia  Cramp and spasm  Muscle weakness (generalized)  Rationale for Evaluation and Treatment: Rehabilitation  ONSET DATE: 3 months  SUBJECTIVE:  SUBJECTIVE STATEMENT: I felt good after DN, it helped.  I missed last week due to not feeling well.    PERTINENT HISTORY:  migraines, HTN, cervical slipped disc in 2020  PAIN:  Are you having pain? Yes: NPRS scale: 0/10, up to 7/10 Pain location: left side of cervical region Pain description: sharp and sometimes dull, "always sore" Aggravating factors: movement Relieving factors: medication  PRECAUTIONS: None  WEIGHT BEARING RESTRICTIONS: No  FALLS:  Has patient fallen in last 6 months? 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  NEXT MD VISIT: as needed  OBJECTIVE:   DIAGNOSTIC FINDINGS:  Reports that she had imaging back in 2020 and it revealed degenerative disc disease and a slipped disc  PATIENT SURVEYS:  10/13/22:  FOTO 43% (projected 56% by visit 12)  COGNITION: Overall cognitive status: Within functional limits for tasks assessed  SENSATION: WFL  POSTURE: rounded shoulders and forward head  PALPATION: Tight musculature/muscle spasms noted along left cervical paraspinals and upper trap. Tender to palpation along left cervical region.  CERVICAL ROM:   Active ROM A/ROM (deg) eval  Flexion 50  Extension 35 with pain  Right lateral flexion 25  Left lateral flexion 25  Right rotation 40  Left rotation 30   (Blank rows = not tested)  UPPER EXTREMITY ROM:  WFL  UPPER EXTREMITY MMT: 10/13/2022:  Right shoulder 4+/5, Left shoulder 4/5  FUNCTIONAL TESTS:  5 times sit to stand: 16.1 sec with min UE use  TODAY'S TREATMENT:               DATE: 10/28/22 Reviewed HEP:  cervical retraction, scapular retraction, cervical rotation, and cervical extension x10 each-verbal cues for alignment and foot position.  ER and horizontal abduction with red band: 2x10  Trigger Point Dry-Needling  Treatment instructions: Expect mild to moderate muscle soreness. S/S of pneumothorax if dry needled over a lung field, and to seek immediate medical attention should they occur. Patient verbalized understanding of these instructions and education. Patient Consent Given: Yes Education handout provided: Yes Muscles treated: left cervical multifidi, left upper trap Electrical  stimulation performed: No Elongation and release to bil neck after needling  Treatment response/outcome: Utilized skilled palpation to identify trigger points.  Able to illicit twitch response and muscle elongation following.                                                                                                                  DATE: 10/13/2022 Reviewed HEP:  cervical retraction, scapular retraction, cervical rotation, and cervical extension x10 each  Trigger Point Dry-Needling  Treatment instructions: Expect mild to moderate muscle soreness. S/S of pneumothorax if dry needled over  a lung field, and to seek immediate medical attention should they occur. Patient verbalized understanding of these instructions and education. Patient Consent Given: Yes Education handout provided: Yes Muscles treated: left cervical multifidi, left upper trap Electrical stimulation performed: No Parameters: N/A Treatment response/outcome: Utilized skilled palpation to identify trigger points.  Able to illicit twitch response and muscle elongation following.   PATIENT EDUCATION:  Education details: Issued HEP and educated on dry needling Person educated: Patient Education method: Consulting civil engineer, Media planner, and Handouts Education comprehension: verbalized understanding and returned demonstration  HOME EXERCISE PROGRAM: Access Code: [email or web address] URL: [email or web address] Date: 10/28/2022 Prepared by: Claiborne Billings  Exercises - Seated Scapular Retraction  - 1 x daily - 7 x weekly - 2 sets - 10 reps - Seated Cervical Retraction  - 1 x daily - 7 x weekly - 2 sets - 10 reps - Seated Cervical Rotation AROM  - 3 x daily - 7 x weekly - 1 sets - 3 reps - 20 hold - Seated Cervical Extension AROM  - 3 x daily - 7 x weekly - 1 sets - 3 reps - 5-10 hold - Shoulder External Rotation and Scapular Retraction with Resistance  - 2 x daily - 7 x weekly - 2 sets - 10 reps - Seated Shoulder Horizontal Abduction with  Resistance  - 2 x daily - 7 x weekly - 2 sets - 10 reps  ASSESSMENT:  CLINICAL IMPRESSION: First time follow-up after evaluation.   PT increased hold time for stretches and pt was performing all aspects correctly.  PT educated regarding postural alignment for work tasks and exercises and pt demonstrated correctly.  Pt with tension and trigger points in Lt>Rt neck and had good twitch response to DN today and improved tissue mobility after manual therapy today.  Pt would benefit from skilled PT to address her functional impairments to allow her to return to a decreased pain lifestyle.  OBJECTIVE IMPAIRMENTS: decreased ROM, decreased strength, increased muscle spasms, postural dysfunction, and pain.   ACTIVITY LIMITATIONS: carrying, lifting, and sleeping  PARTICIPATION LIMITATIONS: cleaning, community activity, and occupation  PERSONAL FACTORS: Time since onset of injury/illness/exacerbation and 1 comorbidity: Hx of cervical "slipped disc" in 2020  are also affecting patient's functional outcome.   REHAB POTENTIAL: Good  CLINICAL DECISION MAKING: Stable/uncomplicated  EVALUATION COMPLEXITY: Low   GOALS: Goals reviewed with patient? Yes  SHORT TERM GOALS: Target date: 11/03/2022  Pt will be independent with initial HEP. Baseline:  Goal status: INITIAL  2.  Pt will report at least a 30% improvement in pain during crafting. Baseline: 7/10 Goal status: INITIAL   LONG TERM GOALS: Target date: 12/03/2022  Pt will be independent with advanced HEP. Baseline:  Goal status: INITIAL  2.  Pt will increase FOTO to at least 56% to demonstrate improvements in functional tasks. Baseline: 43% Goal status: INITIAL  3.  Pt will increase bilat UE strength to at least 4+/5 to allow her to lift objects with improved ease. Baseline: L shoulder 4/5 Goal status: INITIAL  4.  Pt will report being able to roll over in bed with cervical/back pain no greater than 2/10. Baseline: 7/10 Goal status:  INITIAL    PLAN:  PT FREQUENCY: 2x/week  PT DURATION: 8 weeks  PLANNED INTERVENTIONS: Therapeutic exercises, Therapeutic activity, Neuromuscular re-education, Balance training, Gait training, Patient/Family education, Self Care, Joint mobilization, Joint manipulation, Stair training, Aquatic Therapy, Dry Needling, Spinal manipulation, Spinal mobilization, Cryotherapy, Moist heat, Taping, Traction, Ultrasound, Ionotophoresis '4mg'$ /ml Dexamethasone, Manual therapy, and Re-evaluation  PLAN  FOR NEXT SESSION: assess response to dry needling, manual/dry needling as indicated, strengthening   Sigurd Sos, PT 10/28/22 11:44 AM   [facility] [address], [place], [place] [address] Phone # [phone] Fax [phone]

## 2022-11-04 ENCOUNTER — Ambulatory Visit: Payer: No Typology Code available for payment source

## 2022-11-04 DIAGNOSIS — M6281 Muscle weakness (generalized): Secondary | ICD-10-CM

## 2022-11-04 DIAGNOSIS — M542 Cervicalgia: Secondary | ICD-10-CM | POA: Diagnosis not present

## 2022-11-04 DIAGNOSIS — R252 Cramp and spasm: Secondary | ICD-10-CM

## 2022-11-12 ENCOUNTER — Encounter: Payer: No Typology Code available for payment source | Admitting: Physical Therapy

## 2022-11-17 ENCOUNTER — Ambulatory Visit: Payer: No Typology Code available for payment source | Admitting: Physical Therapy

## 2022-11-25 ENCOUNTER — Ambulatory Visit: Payer: No Typology Code available for payment source | Admitting: Physical Therapy

## 2022-12-02 ENCOUNTER — Encounter: Payer: No Typology Code available for payment source | Admitting: Physical Therapy

## 2023-01-20 ENCOUNTER — Ambulatory Visit: Payer: No Typology Code available for payment source | Attending: Internal Medicine | Admitting: Internal Medicine

## 2023-01-20 ENCOUNTER — Encounter: Payer: Self-pay | Admitting: Internal Medicine

## 2023-01-20 VITALS — BP 134/81 | HR 91 | Ht 62.0 in | Wt 164.4 lb

## 2023-01-20 DIAGNOSIS — E785 Hyperlipidemia, unspecified: Secondary | ICD-10-CM | POA: Diagnosis not present

## 2023-01-20 DIAGNOSIS — M791 Myalgia, unspecified site: Secondary | ICD-10-CM | POA: Diagnosis not present

## 2023-01-20 DIAGNOSIS — T466X5A Adverse effect of antihyperlipidemic and antiarteriosclerotic drugs, initial encounter: Secondary | ICD-10-CM | POA: Diagnosis not present

## 2023-01-20 DIAGNOSIS — R931 Abnormal findings on diagnostic imaging of heart and coronary circulation: Secondary | ICD-10-CM | POA: Diagnosis not present

## 2023-01-20 NOTE — Progress Notes (Signed)
LIPID CLINIC CONSULT NOTE  Chief Complaint:  Manage dyslipidemia  Primary Care Physician: Isaac Bliss, Rayford Halsted, MD  Primary Cardiologist:  None  HPI:  Sandy Hanna is a 57 y.o. female who is being seen today for the evaluation of dyslipidemia at the request of Isaac Bliss, [place] [facility]*. This is a pleasant 57 year old female kindly referred for evaluation management of dyslipidemia.  She was referred by PCP after having had intolerance to numerous statins.  In fact she is tried 4 statins previously with significant myalgias.  She had a coronary calcium score last in 2022 which was elevated at 6, all in the LAD which was 81st percentile for age and sex matched controls.  She does report family history of heart disease in her father.  She has been working on diet and lifestyle to try to lower her cholesterol further.  Most recent lipid profile showed total cholesterol of 238, HDL 47, triglycerides 130 and LDL 164.  PMHx:  Past Medical History:  Diagnosis Date   Acute sinusitis, unspecified 12/30/2007   ALLERGIC RHINITIS 12/15/2007   Allergy    ANEMIA-NOS 12/18/2010   ANXIETY 12/15/2007   BLEPHARITIS, RIGHT 04/03/2008   CHEST PAIN 08/25/2010   DEPRESSION 12/15/2007   FATIGUE 08/25/2010   GERD 12/15/2007   HYPERLIPIDEMIA 07/03/2010   HYPERSOMNIA 12/18/2010   HYPERTENSION 06/27/2007   Irritable bowel syndrome 04/28/2009   ISCHEMIC COLITIS 04/28/2009   Migraines 12/23/2008   Neuromuscular disorder ([place])    cervical slipped disc 2020    OTITIS MEDIA, BILATERAL 09/12/2008   RECTAL BLEEDING 04/28/2009   Vitamin D deficiency 03/23/2016    Past Surgical History:  Procedure Laterality Date   BREAST BIOPSY  2005   Negative   COLONOSCOPY     UPPER GASTROINTESTINAL ENDOSCOPY  12/2007    FAMHx:  Family History  Problem Relation Age of Onset   Breast cancer Mother        age 2-65   Hypertension Mother    Diabetes Mother    Diabetes Paternal Grandmother    Ovarian cancer Other        Aunt    Ovarian cancer Maternal Uncle    Colon cancer Neg Hx    Colon polyps Neg Hx    Esophageal cancer Neg Hx    Rectal cancer Neg Hx    Stomach cancer Neg Hx     SOCHx:   reports that she has been smoking cigarettes. She started smoking about 34 years ago. She has a 24.00 pack-year smoking history. She has never used smokeless tobacco. She reports that she does not drink alcohol and does not use drugs.  ALLERGIES:  Allergies  Allergen Reactions   Amlodipine Other (See Comments)    Gum swelling   Amoxicillin-Pot Clavulanate     REACTION: nausea   Clarithromycin     REACTION: Nausea   Crestor [Rosuvastatin] Other (See Comments)    bilatera shoulder pain   Doxycycline     REACTION: severe fatigue per pt   Lipitor [Atorvastatin Calcium] Other (See Comments)    Myalgias joint pain   Lisinopril Other (See Comments)    Angioedema left upper and lower lids   Lovastatin Other (See Comments)    Muscle pain    ROS: Pertinent items noted in HPI and remainder of comprehensive ROS otherwise negative.  HOME MEDS: Current Outpatient Medications on File Prior to Visit  Medication Sig Dispense Refill   albuterol (VENTOLIN HFA) 108 (90 Base) MCG/ACT inhaler Inhale 2 puffs into  the lungs every 4 (four) hours.     amLODipine (NORVASC) 10 MG tablet Take 10 mg by mouth daily.     aspirin 81 MG EC tablet Take 1 tablet (81 mg total) by mouth daily. 30 tablet 99   cyclobenzaprine (FLEXERIL) 5 MG tablet Take 1 tablet (5 mg total) by mouth 2 (two) times daily as needed for muscle spasms. 60 tablet 1   fluticasone (FLONASE) 50 MCG/ACT nasal spray Place 2 sprays into both nostrils daily. 16 g 6   ibuprofen (ADVIL) 800 MG tablet ibuprofen 800 mg tablet  TAKE 1 TABLET BY MOUTH EVERY 8 HOURS AS NEEDED     pantoprazole (PROTONIX) 40 MG tablet TAKE 1 TABLET BY MOUTH EVERY DAY 90 tablet 3   ALPRAZolam (XANAX) 0.25 MG tablet Take 1 tablet (0.25 mg total) by mouth 2 (two) times daily as needed for anxiety.  (Patient not taking: Reported on 10/13/2022) 40 tablet 0   dicyclomine (BENTYL) 20 MG tablet Take 1 tablet (20 mg total) by mouth 3 (three) times daily as needed for spasms. (Patient not taking: Reported on 10/13/2022) 90 tablet 3   fluconazole (DIFLUCAN) 150 MG tablet 1 tab by mouth every 3 days as needed (Patient not taking: Reported on 10/13/2022) 2 tablet 1   losartan (COZAAR) 100 MG tablet Take 1 tablet (100 mg total) by mouth daily. (Patient not taking: Reported on 10/13/2022) 90 tablet 3   montelukast (SINGULAIR) 10 MG tablet Take 1 tablet (10 mg total) by mouth at bedtime. (Patient not taking: Reported on 10/13/2022) 30 tablet 0   ofloxacin (OCUFLOX) 0.3 % ophthalmic solution Place 1 drop into the left eye 4 (four) times daily. (Patient not taking: Reported on 10/13/2022) 5 mL 2   No current facility-administered medications on file prior to visit.    LABS/IMAGING: No results found for this or any previous visit (from the past 48 hour(s)). No results found.  LIPID PANEL:    Component Value Date/Time   CHOL 238 (H) 05/07/2022 1406   TRIG 130.0 05/07/2022 1406   HDL 47.50 05/07/2022 1406   CHOLHDL 5 05/07/2022 1406   VLDL 26.0 05/07/2022 1406   LDLCALC 164 (H) 05/07/2022 1406   LDLCALC 186 (H) 06/11/2020 1005   LDLDIRECT 206.8 06/25/2010 0938    WEIGHTS: Wt Readings from Last 3 Encounters:  01/20/23 164 lb 6.4 oz (74.6 kg)  10/06/22 165 lb 11.2 oz (75.2 kg)  09/24/22 164 lb 12.8 oz (74.8 kg)    VITALS: BP 134/81 (BP Location: Left Arm, Patient Position: Sitting, Cuff Size: Normal)   Pulse 91   Ht '5\' 2"'$  (1.575 m)   Wt 164 lb 6.4 oz (74.6 kg)   SpO2 100%   BMI 30.07 kg/m   EXAM: Deferred  EKG: Deferred  ASSESSMENT: Mixed dyslipidemia, goal LDL <70 CAC score of 6.0, 81st percentile (10/2021) Statin myalgias  PLAN: 1.   Sandy Hanna has a mixed dyslipidemia with an elevated calcium score.  As it is in the 80th percentile, I would likely recommend lipid lowering  to try to achieve a target LDL less than 70.  Unfortunately she cannot tolerate statins.  She does have a history of some IBS and is concerned about whether she might tolerate ezetimibe.  We also discussed the PCSK9 inhibitors which may be difficult to get approved given her lower calcium score.  She has not had any prior cardiovascular events.  The other option might be Nexletol again a brand-name product.  She wants to do  some research on these 3 options and get back to me with her thoughts on it.  Will consider ordering one of the options once I hear back from her.  Thanks again for the kind referral.  Pixie Casino, MD, FACC, [place] Director of the [facility]  [facility] Diplomate of the American Board of Clinical Lipidology Attending Cardiologist  Direct Dial: [phone]  Fax: [phone]  Website:  [email or web address] 01/20/2023, 4:30 PM

## 2023-01-20 NOTE — Patient Instructions (Signed)
Medication Instructions:  Nexletol - pill - daily  Zetia - pill - daily Repatha - injectable - every 2 weeks Praluent - injectable - every 2 weeks  *If you need a refill on your cardiac medications before your next appointment, please call your pharmacy*    Follow-Up: At [facility], you and your health needs are our priority.  As part of our continuing mission to provide you with exceptional heart care, we have created designated Provider Care Teams.  These Care Teams include your primary Cardiologist (physician) and Advanced Practice Providers (APPs -  Physician Assistants and Nurse Practitioners) who all work together to provide you with the care you need, when you need it.  We recommend signing up for the patient portal called "MyChart".  Sign up information is provided on this After Visit Summary.  MyChart is used to connect with patients for Virtual Visits (Telemedicine).  Patients are able to view lab/test results, encounter notes, upcoming appointments, etc.  Non-urgent messages can be sent to your provider as well.   To learn more about what you can do with MyChart, go to [email or web address].    Your next appointment:    PENDING medication changes

## 2023-05-10 ENCOUNTER — Encounter: Payer: No Typology Code available for payment source | Admitting: Internal Medicine

## 2023-05-25 ENCOUNTER — Ambulatory Visit: Payer: No Typology Code available for payment source | Admitting: Internal Medicine

## 2023-05-25 VITALS — BP 110/68 | HR 80 | Temp 98.2°F | Ht 62.0 in | Wt 164.0 lb

## 2023-05-25 DIAGNOSIS — E559 Vitamin D deficiency, unspecified: Secondary | ICD-10-CM

## 2023-05-25 DIAGNOSIS — F17210 Nicotine dependence, cigarettes, uncomplicated: Secondary | ICD-10-CM

## 2023-05-25 DIAGNOSIS — Z122 Encounter for screening for malignant neoplasm of respiratory organs: Secondary | ICD-10-CM

## 2023-05-25 DIAGNOSIS — Z0000 Encounter for general adult medical examination without abnormal findings: Secondary | ICD-10-CM

## 2023-05-25 DIAGNOSIS — M542 Cervicalgia: Secondary | ICD-10-CM | POA: Diagnosis not present

## 2023-05-25 DIAGNOSIS — E785 Hyperlipidemia, unspecified: Secondary | ICD-10-CM

## 2023-05-25 DIAGNOSIS — I10 Essential (primary) hypertension: Secondary | ICD-10-CM | POA: Diagnosis not present

## 2023-05-25 DIAGNOSIS — R5383 Other fatigue: Secondary | ICD-10-CM | POA: Diagnosis not present

## 2023-05-25 LAB — LIPID PANEL
Cholesterol: 240 mg/dL — ABNORMAL HIGH (ref 0–200)
HDL: 45.8 mg/dL (ref >39.00)
LDL Cholesterol: 174 mg/dL — ABNORMAL HIGH (ref 0–99)
NonHDL: 193.96
Total CHOL/HDL Ratio: 5
Triglycerides: 100 mg/dL (ref 0.0–149.0)
VLDL: 20 mg/dL (ref 0.0–40.0)

## 2023-05-25 LAB — COMPREHENSIVE METABOLIC PANEL
ALT: 13 U/L (ref 0–35)
AST: 14 U/L (ref 0–37)
Albumin: 4.2 g/dL (ref 3.5–5.2)
Alkaline Phosphatase: 161 U/L — ABNORMAL HIGH (ref 39–117)
BUN: 8 mg/dL (ref 6–23)
CO2: 26 mEq/L (ref 19–32)
Calcium: 9.7 mg/dL (ref 8.4–10.5)
Chloride: 105 mEq/L (ref 96–112)
Creatinine, Ser: 0.79 mg/dL (ref 0.40–1.20)
GFR: 82.86 mL/min (ref >60.00)
Glucose, Bld: 92 mg/dL (ref 70–99)
Potassium: 3.9 mEq/L (ref 3.5–5.1)
Sodium: 139 mEq/L (ref 135–145)
Total Bilirubin: 0.4 mg/dL (ref 0.2–1.2)
Total Protein: 7.6 g/dL (ref 6.0–8.3)

## 2023-05-25 LAB — VITAMIN D 25 HYDROXY (VIT D DEFICIENCY, FRACTURES): VITD: 37.55 ng/mL (ref 30.00–100.00)

## 2023-05-25 LAB — CBC WITH DIFFERENTIAL/PLATELET
Basophils Absolute: 0.1 10*3/uL (ref 0.0–0.1)
Basophils Relative: 1.1 % (ref 0.0–3.0)
Eosinophils Absolute: 0 10*3/uL (ref 0.0–0.7)
Eosinophils Relative: 0.3 % (ref 0.0–5.0)
HCT: 41.9 % (ref 36.0–46.0)
Hemoglobin: 13.8 g/dL (ref 12.0–15.0)
Lymphocytes Relative: 48.8 % — ABNORMAL HIGH (ref 12.0–46.0)
Lymphs Abs: 4.1 10*3/uL — ABNORMAL HIGH (ref 0.7–4.0)
MCHC: 32.9 g/dL (ref 30.0–36.0)
MCV: 87.3 fl (ref 78.0–100.0)
Monocytes Absolute: 0.6 10*3/uL (ref 0.1–1.0)
Monocytes Relative: 7 % (ref 3.0–12.0)
Neutro Abs: 3.6 10*3/uL (ref 1.4–7.7)
Neutrophils Relative %: 42.8 % — ABNORMAL LOW (ref 43.0–77.0)
Platelets: 255 10*3/uL (ref 150.0–400.0)
RBC: 4.8 Mil/uL (ref 3.87–5.11)
RDW: 14.9 % (ref 11.5–15.5)
WBC: 8.4 10*3/uL (ref 4.0–10.5)

## 2023-05-25 LAB — VITAMIN B12: Vitamin B-12: 350 pg/mL (ref 211–911)

## 2023-05-25 LAB — TSH: TSH: 3.86 u[IU]/mL (ref 0.35–5.50)

## 2023-05-25 NOTE — Progress Notes (Signed)
Established Patient Office Visit     CC/Reason for Visit: Annual preventive exam, discuss neck pain  HPI: Sandy Hanna is a 57 y.o. female who is coming in today for the above mentioned reasons. Past Medical History is significant for: Hypertension, hyperlipidemia, GERD, IBS, ongoing nicotine dependence and known cervical degenerative disc disease.  She continues to smoke about a third of a pack a day.  She has routine eye and dental care.  She is scheduled to see GYN this week.  She is overdue for shingles vaccine as well as flu and COVID vaccinations.  She has had chronic neck pain and is now having some left-sided radiculopathy.  She has failed conservative management.  Has not had an MRI yet.   Past Medical/Surgical History: Past Medical History:  Diagnosis Date   Acute sinusitis, unspecified 12/30/2007   ALLERGIC RHINITIS 12/15/2007   Allergy    ANEMIA-NOS 12/18/2010   ANXIETY 12/15/2007   BLEPHARITIS, RIGHT 04/03/2008   CHEST PAIN 08/25/2010   DEPRESSION 12/15/2007   FATIGUE 08/25/2010   GERD 12/15/2007   HYPERLIPIDEMIA 07/03/2010   HYPERSOMNIA 12/18/2010   HYPERTENSION 06/27/2007   Irritable bowel syndrome 04/28/2009   ISCHEMIC COLITIS 04/28/2009   Migraines 12/23/2008   Neuromuscular disorder ([facility])    cervical slipped disc 2020    OTITIS MEDIA, BILATERAL 09/12/2008   RECTAL BLEEDING 04/28/2009   Vitamin D deficiency 03/23/2016    Past Surgical History:  Procedure Laterality Date   BREAST BIOPSY  2005   Negative   COLONOSCOPY     UPPER GASTROINTESTINAL ENDOSCOPY  12/2007    Social History:  reports that she has been smoking cigarettes. She started smoking about 34 years ago. She has a 24.00 pack-year smoking history. She has never used smokeless tobacco. She reports that she does not drink alcohol and does not use drugs.  Allergies: Allergies  Allergen Reactions   Amlodipine Other (See Comments)    Gum swelling   Amoxicillin-Pot Clavulanate     REACTION: nausea    Clarithromycin     REACTION: Nausea   Crestor [Rosuvastatin] Other (See Comments)    bilatera shoulder pain   Doxycycline     REACTION: severe fatigue per pt   Lipitor [Atorvastatin Calcium] Other (See Comments)    Myalgias joint pain   Lisinopril Other (See Comments)    Angioedema left upper and lower lids   Lovastatin Other (See Comments)    Muscle pain    Family History:  Family History  Problem Relation Age of Onset   Breast cancer Mother        age 54-65   Hypertension Mother    Diabetes Mother    Diabetes Paternal Grandmother    Ovarian cancer Other        Aunt   Ovarian cancer Maternal Uncle    Colon cancer Neg Hx    Colon polyps Neg Hx    Esophageal cancer Neg Hx    Rectal cancer Neg Hx    Stomach cancer Neg Hx      Current Outpatient Medications:    amLODipine (NORVASC) 10 MG tablet, Take 10 mg by mouth daily., Disp: , Rfl:    aspirin 81 MG EC tablet, Take 1 tablet (81 mg total) by mouth daily., Disp: 30 tablet, Rfl: 99   fluticasone (FLONASE) 50 MCG/ACT nasal spray, Place 2 sprays into both nostrils daily., Disp: 16 g, Rfl: 6   ibuprofen (ADVIL) 800 MG tablet, ibuprofen 800 mg tablet  TAKE  1 TABLET BY MOUTH EVERY 8 HOURS AS NEEDED, Disp: , Rfl:    losartan (COZAAR) 100 MG tablet, Take 1 tablet (100 mg total) by mouth daily., Disp: 90 tablet, Rfl: 3   pantoprazole (PROTONIX) 40 MG tablet, TAKE 1 TABLET BY MOUTH EVERY DAY, Disp: 90 tablet, Rfl: 3   cyclobenzaprine (FLEXERIL) 5 MG tablet, Take 1 tablet (5 mg total) by mouth 2 (two) times daily as needed for muscle spasms. (Patient not taking: Reported on 05/25/2023), Disp: 60 tablet, Rfl: 1   dicyclomine (BENTYL) 20 MG tablet, Take 1 tablet (20 mg total) by mouth 3 (three) times daily as needed for spasms. (Patient not taking: Reported on 10/13/2022), Disp: 90 tablet, Rfl: 3  Review of Systems:  Negative unless indicated in HPI.   Physical Exam: Vitals:   05/25/23 0803  BP: 110/68  Pulse: 80  Temp: 98.2 F  (36.8 C)  TempSrc: Oral  SpO2: 99%  Weight: 164 lb (74.4 kg)  Height: 5\' 2"  (1.575 m)    Body mass index is 30 kg/m.   Physical Exam Vitals reviewed.  Constitutional:      General: She is not in acute distress.    Appearance: Normal appearance. She is not ill-appearing, toxic-appearing or diaphoretic.  HENT:     Head: Normocephalic.     Right Ear: Tympanic membrane, ear canal and external ear normal. There is no impacted cerumen.     Left Ear: Tympanic membrane, ear canal and external ear normal. There is no impacted cerumen.     Nose: Nose normal.     Mouth/Throat:     Mouth: Mucous membranes are moist.     Pharynx: Oropharynx is clear. No oropharyngeal exudate or posterior oropharyngeal erythema.  Eyes:     General: No scleral icterus.       Right eye: No discharge.        Left eye: No discharge.     Conjunctiva/sclera: Conjunctivae normal.     Pupils: Pupils are equal, round, and reactive to light.  Neck:     Vascular: No carotid bruit.  Cardiovascular:     Rate and Rhythm: Normal rate and regular rhythm.     Pulses: Normal pulses.     Heart sounds: Normal heart sounds.  Pulmonary:     Effort: Pulmonary effort is normal. No respiratory distress.     Breath sounds: Normal breath sounds.  Abdominal:     General: Abdomen is flat. Bowel sounds are normal.     Palpations: Abdomen is soft.  Musculoskeletal:        General: Normal range of motion.     Cervical back: Normal range of motion.  Skin:    General: Skin is warm and dry.  Neurological:     General: No focal deficit present.     Mental Status: She is alert and oriented to person, place, and time. Mental status is at baseline.  Psychiatric:        Mood and Affect: Mood normal.        Behavior: Behavior normal.        Thought Content: Thought content normal.        Judgment: Judgment normal.      Impression and Plan:  Hyperlipidemia, unspecified hyperlipidemia type -     Lipid panel; Future  Essential  hypertension -     CBC with Differential/Platelet; Future -     Comprehensive metabolic panel; Future  Vitamin D deficiency  Encounter for preventive health examination  Cervical  pain (neck)  Screening for lung cancer -     Ambulatory Referral for Lung Cancer Scre  Fatigue, unspecified type -     TSH; Future -     Vitamin B12; Future -     VITAMIN D 25 Hydroxy (Vit-D Deficiency, Fractures); Future  Cigarette nicotine dependence without complication -     Ambulatory Referral for Lung Cancer Scre  Cervicalgia -     MR CERVICAL SPINE WO CONTRAST; Future  -Recommend routine eye and dental care. -Healthy lifestyle discussed in detail. -Labs to be updated today. -Prostate cancer screening: N/A Health Maintenance  Topic Date Due   Screening for Lung Cancer  Never done   Mammogram  07/14/2022   Pap Smear  07/15/2023   Colon Cancer Screening  08/30/2023   Flu Shot  06/23/2023   DTaP/Tdap/Td vaccine (3 - Td or Tdap) 03/23/2026   Hepatitis C Screening  Completed   HIV Screening  Completed   HPV Vaccine  Aged Out   COVID-19 Vaccine  Discontinued   Zoster (Shingles) Vaccine  Discontinued     -[place]ies [place]ingles [place] today. -Sent for lung cancer screening given ongoing smoking history. -Has appointments with GYN for mammo/Pap smear next week. -She has failed conservative treatment for her cervical spine degenerative disc disease.  She is now having some numbness and at times pain on the outside of her left arm and hand.  She has done physical therapy and has tried stretching techniques, dry needling, muscle relaxers and NSAIDs.  Will order cervical spine MRI for further evaluation.     Chaya Jan, MD [facility] at [facility]

## 2023-06-02 ENCOUNTER — Ambulatory Visit (HOSPITAL_BASED_OUTPATIENT_CLINIC_OR_DEPARTMENT_OTHER)
Admission: RE | Admit: 2023-06-02 | Discharge: 2023-06-02 | Disposition: A | Discharge status: Home / Self Care | Payer: No Typology Code available for payment source | Source: Ambulatory Visit | Attending: Internal Medicine | Admitting: Internal Medicine

## 2023-06-02 DIAGNOSIS — M542 Cervicalgia: Secondary | ICD-10-CM | POA: Insufficient documentation

## 2023-06-09 ENCOUNTER — Telehealth: Payer: Self-pay | Admitting: Internal Medicine

## 2023-06-09 DIAGNOSIS — R1902 Left upper quadrant abdominal swelling, mass and lump: Secondary | ICD-10-CM

## 2023-06-09 NOTE — Telephone Encounter (Addendum)
Pt has an appointment on Monday - 06/13/23, here at [facility] to see MD.  Pt called to say she has a lump (Infected sebaceous cyst // Possible abscess under left upper abdominal) that needs to be removed.  Pt is asking if MD could just give her a referral so that she does not have to come in for this appointment,   ...and she can just go see a specialist to have the lump removed?  Please call Pt back to discuss.

## 2023-06-09 NOTE — Telephone Encounter (Signed)
Referral placed, appointment cancelled, and patient is aware.

## 2023-06-13 ENCOUNTER — Ambulatory Visit: Payer: No Typology Code available for payment source | Admitting: Internal Medicine

## 2023-06-16 ENCOUNTER — Other Ambulatory Visit: Payer: Self-pay | Admitting: *Deleted

## 2023-06-16 DIAGNOSIS — E785 Hyperlipidemia, unspecified: Secondary | ICD-10-CM

## 2023-06-16 MED ORDER — ATORVASTATIN CALCIUM 40 MG PO TABS: 40.0000 mg | ORAL_TABLET | Freq: Every day | ORAL | 1 refills | Status: DC

## 2023-07-05 ENCOUNTER — Encounter: Payer: Self-pay | Admitting: Internal Medicine

## 2023-08-09 ENCOUNTER — Other Ambulatory Visit: Payer: Self-pay | Admitting: Internal Medicine

## 2023-08-10 ENCOUNTER — Encounter: Payer: Self-pay | Admitting: Internal Medicine

## 2023-08-31 ENCOUNTER — Encounter: Payer: Self-pay | Admitting: Gastroenterology

## 2023-09-03 ENCOUNTER — Other Ambulatory Visit: Payer: Self-pay | Admitting: Internal Medicine

## 2023-09-04 ENCOUNTER — Other Ambulatory Visit: Payer: Self-pay | Admitting: Internal Medicine

## 2023-09-05 MED ORDER — PANTOPRAZOLE SODIUM 40 MG PO TBEC: 40.0000 mg | DELAYED_RELEASE_TABLET | Freq: Every day | ORAL | 1 refills | Status: DC

## 2023-09-16 ENCOUNTER — Other Ambulatory Visit: Payer: No Typology Code available for payment source

## 2023-09-25 ENCOUNTER — Other Ambulatory Visit: Payer: Self-pay | Admitting: Internal Medicine

## 2023-09-26 ENCOUNTER — Other Ambulatory Visit: Payer: Self-pay

## 2023-09-26 ENCOUNTER — Encounter: Payer: Self-pay | Admitting: Internal Medicine

## 2023-09-26 MED ORDER — AMLODIPINE BESYLATE 10 MG PO TABS
10.0000 mg | ORAL_TABLET | Freq: Every day | ORAL | 1 refills | Status: DC
  Filled 2023-09-26: qty 90, 90d supply, fill #0

## 2023-09-26 MED ORDER — AMLODIPINE BESYLATE 10 MG PO TABS: 10.0000 mg | ORAL_TABLET | Freq: Every day | ORAL | 1 refills | Status: DC

## 2023-09-30 ENCOUNTER — Encounter: Payer: Self-pay | Admitting: Gastroenterology

## 2024-01-02 ENCOUNTER — Other Ambulatory Visit: Payer: Self-pay | Admitting: Internal Medicine

## 2024-01-04 ENCOUNTER — Encounter: Payer: Self-pay | Admitting: Gastroenterology

## 2024-01-09 ENCOUNTER — Ambulatory Visit (AMBULATORY_SURGERY_CENTER): Payer: No Typology Code available for payment source

## 2024-01-09 VITALS — Ht 62.0 in | Wt 167.0 lb

## 2024-01-09 DIAGNOSIS — Z860100 Personal history of colon polyps, unspecified: Secondary | ICD-10-CM

## 2024-01-09 MED ORDER — SUFLAVE 178.7 G PO SOLR: 1.0000 | Freq: Once | ORAL | 0 refills | Status: AC

## 2024-01-09 NOTE — Progress Notes (Signed)

## 2024-01-27 ENCOUNTER — Encounter: Payer: Self-pay | Admitting: Gastroenterology

## 2024-02-03 ENCOUNTER — Ambulatory Visit: Payer: No Typology Code available for payment source | Admitting: Gastroenterology

## 2024-02-03 ENCOUNTER — Encounter: Payer: Self-pay | Admitting: Gastroenterology

## 2024-02-03 VITALS — BP 136/66 | HR 71 | Temp 97.2°F | Resp 12 | Ht 62.0 in | Wt 167.0 lb

## 2024-02-03 DIAGNOSIS — Z860100 Personal history of colon polyps, unspecified: Secondary | ICD-10-CM

## 2024-02-03 DIAGNOSIS — D120 Benign neoplasm of cecum: Secondary | ICD-10-CM

## 2024-02-03 DIAGNOSIS — D123 Benign neoplasm of transverse colon: Secondary | ICD-10-CM | POA: Diagnosis not present

## 2024-02-03 DIAGNOSIS — Z1211 Encounter for screening for malignant neoplasm of colon: Secondary | ICD-10-CM | POA: Diagnosis present

## 2024-02-03 DIAGNOSIS — K648 Other hemorrhoids: Secondary | ICD-10-CM

## 2024-02-03 DIAGNOSIS — K644 Residual hemorrhoidal skin tags: Secondary | ICD-10-CM | POA: Diagnosis not present

## 2024-02-03 DIAGNOSIS — D122 Benign neoplasm of ascending colon: Secondary | ICD-10-CM

## 2024-02-03 DIAGNOSIS — K5730 Diverticulosis of large intestine without perforation or abscess without bleeding: Secondary | ICD-10-CM

## 2024-02-03 DIAGNOSIS — K621 Rectal polyp: Secondary | ICD-10-CM | POA: Diagnosis not present

## 2024-02-03 DIAGNOSIS — K635 Polyp of colon: Secondary | ICD-10-CM

## 2024-02-03 DIAGNOSIS — D125 Benign neoplasm of sigmoid colon: Secondary | ICD-10-CM

## 2024-02-03 DIAGNOSIS — D128 Benign neoplasm of rectum: Secondary | ICD-10-CM

## 2024-02-03 MED ORDER — SODIUM CHLORIDE 0.9 % IV SOLN: 500.0000 mL | INTRAVENOUS | Status: AC

## 2024-02-03 NOTE — Progress Notes (Signed)
 [facility] History and Physical   Primary Care Physician:  Philip Aspen, Limmie Patricia, MD   Reason for Procedure:  History of adenomatous colon polyps  Plan:    Surveillance colonoscopy with possible interventions as needed     HPI: Sandy Hanna is a very pleasant 58 y.o. female here for surveillance colonoscopy. Denies any nausea, vomiting, abdominal pain, melena or bright red blood per rectum  The risks and benefits as well as alternatives of endoscopic procedure(s) have been discussed and reviewed. All questions answered. The patient agrees to proceed.    Past Medical History:  Diagnosis Date   Acute sinusitis, unspecified 12/30/2007   ALLERGIC RHINITIS 12/15/2007   Allergy    ANEMIA-NOS 12/18/2010   ANXIETY 12/15/2007   BLEPHARITIS, RIGHT 04/03/2008   CHEST PAIN 08/25/2010   DEPRESSION 12/15/2007   FATIGUE 08/25/2010   GERD 12/15/2007   Heart murmur    HYPERLIPIDEMIA 07/03/2010   HYPERSOMNIA 12/18/2010   HYPERTENSION 06/27/2007   Irritable bowel syndrome 04/28/2009   ISCHEMIC COLITIS 04/28/2009   Migraines 12/23/2008   Neuromuscular disorder ([facility])    cervical slipped disc 2020    OTITIS MEDIA, BILATERAL 09/12/2008   RECTAL BLEEDING 04/28/2009   Vitamin D deficiency 03/23/2016    Past Surgical History:  Procedure Laterality Date   BREAST BIOPSY  2005   Negative   COLONOSCOPY     UPPER GASTROINTESTINAL ENDOSCOPY  12/2007    Prior to Admission medications   Medication Sig Start Date End Date Taking? Authorizing Provider  amLODipine (NORVASC) 10 MG tablet TAKE 1 TABLET BY MOUTH EVERY DAY 01/03/24  Yes Philip Aspen, Limmie Patricia, MD  aspirin 81 MG EC tablet Take 1 tablet (81 mg total) by mouth daily. 11/19/21  Yes Corwin Levins, MD  pantoprazole (PROTONIX) 40 MG tablet Take 1 tablet (40 mg total) by mouth daily. 09/05/23  Yes Philip Aspen, Limmie Patricia, MD  cyclobenzaprine (FLEXERIL) 5 MG tablet Take 1 tablet (5 mg total) by mouth 2 (two)  times daily as needed for muscle spasms. 10/06/22   Philip Aspen, Limmie Patricia, MD  dicyclomine (BENTYL) 20 MG tablet Take 1 tablet (20 mg total) by mouth 3 (three) times daily as needed for spasms. Patient not taking: Reported on 10/13/2022 03/30/18   Corwin Levins, MD  fluticasone [facility]) 50 MCG/ACT nasal spray Place 2 sprays into both nostrils daily. 01/15/19   Olive Bass, FNP  ibuprofen (ADVIL) 800 MG tablet ibuprofen 800 mg tablet  TAKE 1 TABLET BY MOUTH EVERY 8 HOURS AS NEEDED    [provider]    Current Outpatient Medications  Medication Sig Dispense Refill   amLODipine (NORVASC) 10 MG tablet TAKE 1 TABLET BY MOUTH EVERY DAY 90 tablet 1   aspirin 81 MG EC tablet Take 1 tablet (81 mg total) by mouth daily. 30 tablet 99   pantoprazole (PROTONIX) 40 MG tablet Take 1 tablet (40 mg total) by mouth daily. 90 tablet 1   cyclobenzaprine (FLEXERIL) 5 MG tablet Take 1 tablet (5 mg total) by mouth 2 (two) times daily as needed for muscle spasms. 60 tablet 1   dicyclomine (BENTYL) 20 MG tablet Take 1 tablet (20 mg total) by mouth 3 (three) times daily as needed for spasms. (Patient not taking: Reported on 10/13/2022) 90 tablet 3   fluticasone (FLONASE) 50 MCG/ACT nasal spray Place 2 sprays into both nostrils daily. 16 g 6   ibuprofen (ADVIL) 800 MG tablet ibuprofen 800 mg tablet  TAKE 1 TABLET  BY MOUTH EVERY 8 HOURS AS NEEDED     Current Facility-Administered Medications  Medication Dose Route Frequency Provider Last Rate Last Admin   0.9 %  sodium chloride infusion  500 mL Intravenous Continuous Dijon Cosens, Eleonore Chiquito, MD        Allergies as of 02/03/2024 - Review Complete 02/03/2024  Allergen Reaction Noted   Lisinopril Other (See Comments) 05/17/2017   Amoxicillin-pot clavulanate Other (See Comments) 09/16/2008   Clarithromycin Other (See Comments) 06/27/2007   Crestor [rosuvastatin] Other (See Comments) 06/11/2020   Doxycycline Other (See Comments) 05/28/2009    Lipitor [atorvastatin calcium] Other (See Comments) 05/18/2019   Lovastatin Other (See Comments) 05/07/2022    Family History  Problem Relation Age of Onset   Breast cancer Mother        age 13-65   Hypertension Mother    Diabetes Mother    Diabetes Paternal Grandmother    Ovarian cancer Other        Aunt   Ovarian cancer Maternal Uncle    Colon cancer Neg Hx    Colon polyps Neg Hx    Esophageal cancer Neg Hx    Rectal cancer Neg Hx    Stomach cancer Neg Hx     Social History   Socioeconomic History   Marital status: Single    Spouse name: Not on file   Number of children: 1   Years of education: Not on file   Highest education level: Not on file  Occupational History   Occupation: Naval architect: [employer]    Comment: Collections  Tobacco Use   Smoking status: Every Day    Average packs/day: 0.8 packs/day for 32.0 years (25.6 ttl pk-yrs)    Types: Cigarettes    Start date: 71    Last attempt to quit: 11/20/2020    Years since quitting: 3.2   Smokeless tobacco: Never   Tobacco comments:    6 cigarettes a day  Vaping Use   Vaping status: Never Used  Substance and Sexual Activity   Alcohol use: No   Drug use: No   Sexual activity: Not on file  Other Topics Concern   Not on file  Social History Narrative   Patient does not get regular exercise   Daily Caffeine- use 2 cups daily   Social Drivers of Health   Financial Resource Strain: Not on file  Food Insecurity: Not on file  Transportation Needs: Not on file  Physical Activity: Not on file  Stress: Not on file  Social Connections: Not on file  Intimate Partner Violence: Not on file    Review of Systems:  All other review of systems negative except as mentioned in the HPI.  Physical Exam: Vital signs in last 24 hours: BP 138/66   Pulse 81   Temp (!) 97.2 F (36.2 C)   Ht 5\' 2"  (1.575 m)   Wt 167 lb (75.8 kg)   SpO2 99%   BMI 30.54 kg/m  General:   Alert, NAD Lungs:  Clear .    Heart:  Regular rate and rhythm Abdomen:  Soft, nontender and nondistended. Neuro/Psych:  Alert and cooperative. Normal mood and affect. A and O x 3  Reviewed labs, radiology imaging, old records and pertinent past GI work up  Patient is appropriate for planned procedure(s) and anesthesia in an ambulatory setting   K. Scherry Ran , MD [phone]

## 2024-02-03 NOTE — Progress Notes (Signed)
 Pt's states no medical or surgical changes since previsit or office visit.

## 2024-02-03 NOTE — Patient Instructions (Signed)
 YOU HAD AN ENDOSCOPIC PROCEDURE TODAY AT THE [facility]:   Refer to the procedure report that was given to you for any specific questions about what was found during the examination.  If the procedure report does not answer your questions, please call your gastroenterologist to clarify.  If you requested that your care partner not be given the details of your procedure findings, then the procedure report has been included in a sealed envelope for you to review at your convenience later.  YOU SHOULD EXPECT: Some feelings of bloating in the abdomen. Passage of more gas than usual.  Walking can help get rid of the air that was put into your GI tract during the procedure and reduce the bloating. If you had a lower endoscopy (such as a colonoscopy or flexible sigmoidoscopy) you may notice spotting of blood in your stool or on the toilet paper. If you underwent a bowel prep for your procedure, you may not have a normal bowel movement for a few days.  Please Note:  You might notice some irritation and congestion in your nose or some drainage.  This is from the oxygen used during your procedure.  There is no need for concern and it should clear up in a day or so.  SYMPTOMS TO REPORT IMMEDIATELY:  Following lower endoscopy (colonoscopy or flexible sigmoidoscopy):  Excessive amounts of blood in the stool  Significant tenderness or worsening of abdominal pains  Swelling of the abdomen that is new, acute  Fever of 100F or higher   For urgent or emergent issues, a gastroenterologist can be reached at any hour by calling [phone]. Do not use MyChart messaging for urgent concerns.    DIET:  We do recommend a small meal at first, but then you may proceed to your regular diet.  Drink plenty of fluids but you should avoid alcoholic beverages for 24 hours.  MEDICATIONS: Continue present medications.  FOLLOW UP: Await pathology results. Repeat colonoscopy in 3-5 years for surveillance based  on pathology results.  Please see handouts given to you by your recovery nurse: Polyps, Diverticulosis, Hemorrhoids.  Thank you for allowing [place] to provide for your healthcare needs today.  ACTIVITY:  You should plan to take it easy for the rest of today and you should NOT DRIVE or use heavy machinery until tomorrow (because of the sedation medicines used during the test).    FOLLOW UP: Our staff will call the number listed on your records the next business day following your procedure.  We will call around 7:15- 8:00 am to check on you and address any questions or concerns that you may have regarding the information given to you following your procedure. If we do not reach you, we will leave a message.     If any biopsies were taken you will be contacted by phone or by letter within the next 1-3 weeks.  Please call us at [phone] if you have not heard about the biopsies in 3 weeks.    SIGNATURES/CONFIDENTIALITY: You and/or your care partner have signed paperwork which will be entered into your electronic medical record.  These signatures attest to the fact that that the information above on your After Visit Summary has been reviewed and is understood.  Full responsibility of the confidentiality of this discharge information lies with you and/or your care-partner.

## 2024-02-03 NOTE — Progress Notes (Signed)
 Called to room to assist during endoscopic procedure.  Patient ID and intended procedure confirmed with present staff. Received instructions for my participation in the procedure from the performing physician.

## 2024-02-03 NOTE — Op Note (Signed)
  [facility] Patient Name: Sandy Hanna Procedure Date: 02/03/2024 3:13 PM MRN: [id number] Endoscopist: Napoleon Form , MD, [id number] Age: 58 Referring MD:  Date of Birth: 1966/02/23 Gender: Female Account #: [id number] Procedure:                Colonoscopy Indications:              High risk colon cancer surveillance: Personal                            history of multiple (3 or more) adenomas, High risk                            colon cancer surveillance: Personal history of                            adenoma less than 10 mm in size Medicines:                Monitored Anesthesia Care Procedure:                Pre-Anesthesia Assessment:                           - Prior to the procedure, a History and Physical                            was performed, and patient medications and                            allergies were reviewed. The patient's tolerance of                            previous anesthesia was also reviewed. The risks                            and benefits of the procedure and the sedation                            options and risks were discussed with the patient.                            All questions were answered, and informed consent                            was obtained. Prior Anticoagulants: The patient has                            taken no anticoagulant or antiplatelet agents. ASA                            Grade Assessment: II - A patient with mild systemic                            disease. After reviewing the risks and benefits,  the patient was deemed in satisfactory condition to                            undergo the procedure.                           After obtaining informed consent, the colonoscope                            was passed under direct vision. Throughout the                            procedure, the patient's blood pressure, pulse, and                            oxygen saturations were  monitored continuously. The                            Olympus Scope SN: [id number] was introduced through                            the anus and advanced to the the cecum, identified                            by appendiceal orifice and ileocecal valve. The                            colonoscopy was performed without difficulty. The                            patient tolerated the procedure well. The quality                            of the bowel preparation was good. The ileocecal                            valve, appendiceal orifice, and rectum were                            photographed. Scope In: 3:16:32 PM Scope Out: 3:35:32 PM Scope Withdrawal Time: 0 hours 11 minutes 50 seconds  Total Procedure Duration: 0 hours 19 minutes 0 seconds  Findings:                 The perianal and digital rectal examinations were                            normal.                           Two sessile polyps were found in the transverse                            colon and cecum. The polyps were 1 to 2 mm in size.  These polyps were removed with a cold biopsy                            forceps. Resection and retrieval were complete.                           Four sessile polyps were found in the rectum and                            sigmoid colon. The polyps were 4 to 6 mm in size.                            These polyps were removed with a cold snare.                            Resection and retrieval were complete.                           Scattered small-mouthed diverticula were found in                            the sigmoid colon and descending colon.                           Non-bleeding external and internal hemorrhoids were                            found during retroflexion. The hemorrhoids were                            medium-sized. Complications:            No immediate complications. Estimated Blood Loss:     Estimated blood loss was minimal. Impression:                - Two 1 to 2 mm polyps in the transverse colon and                            in the cecum, removed with a cold biopsy forceps.                            Resected and retrieved.                           - Four 4 to 6 mm polyps in the rectum and in the                            sigmoid colon, removed with a cold snare. Resected                            and retrieved.                           - Diverticulosis in the sigmoid colon and in the  descending colon.                           - Non-bleeding external and internal hemorrhoids. Recommendation:           - Patient has a contact number available for                            emergencies. The signs and symptoms of potential                            delayed complications were discussed with the                            patient. Return to normal activities tomorrow.                            Written discharge instructions were provided to the                            patient.                           - Resume previous diet.                           - Continue present medications.                           - Await pathology results.                           - Repeat colonoscopy in 3-5 years for surveillance                            based on pathology results. Napoleon Form, MD 02/03/2024 3:41:38 PM This report has been signed electronically.

## 2024-02-03 NOTE — Progress Notes (Signed)
 A/o x 3, VSS, gd SR's, pleased with anesthesia, report to RN

## 2024-02-06 ENCOUNTER — Telehealth: Payer: Self-pay

## 2024-02-06 NOTE — Telephone Encounter (Signed)
  Follow up Call-     02/03/2024    2:45 PM  Call back number  Post procedure Call Back phone  # [phone]  Permission to leave phone message Yes     Patient questions:  Do you have a fever, pain , or abdominal swelling? No. Pain Score  0 *  Have you tolerated food without any problems? Yes.    Have you been able to return to your normal activities? Yes.    Do you have any questions about your discharge instructions: Diet   No. Medications  No. Follow up visit  No.  Do you have questions or concerns about your Care? No.  Actions: * If pain score is 4 or above: No action needed, pain <4.

## 2024-02-08 LAB — SURGICAL PATHOLOGY

## 2024-03-29 ENCOUNTER — Encounter: Payer: Self-pay | Admitting: Gastroenterology

## 2024-05-27 ENCOUNTER — Other Ambulatory Visit: Payer: Self-pay | Admitting: Internal Medicine

## 2024-06-26 ENCOUNTER — Ambulatory Visit (INDEPENDENT_AMBULATORY_CARE_PROVIDER_SITE_OTHER): Admitting: Internal Medicine

## 2024-06-26 ENCOUNTER — Encounter: Payer: Self-pay | Admitting: Internal Medicine

## 2024-06-26 VITALS — BP 102/72 | HR 80 | Temp 98.3°F | Ht 62.0 in | Wt 170.5 lb

## 2024-06-26 DIAGNOSIS — M545 Low back pain, unspecified: Secondary | ICD-10-CM

## 2024-06-26 DIAGNOSIS — G8929 Other chronic pain: Secondary | ICD-10-CM

## 2024-06-26 MED ORDER — MELOXICAM 7.5 MG PO TABS: 7.5000 mg | ORAL_TABLET | Freq: Every day | ORAL | 0 refills | Status: DC

## 2024-06-26 NOTE — Progress Notes (Signed)
 Established Patient Office Visit     CC/Reason for Visit: Low back pain  HPI: Sandy Hanna is a 58 y.o. female who is coming in today for the above mentioned reasons.  Midline low back pain has been going on for months.  She will sometimes have radiation down her buttocks and thighs bilaterally but not often.  Sometimes when walking she feels her lower back crack.  No recent increase in physical activity.  No bowel or bladder incontinence, no saddle anesthesia.   Past Medical/Surgical History: Past Medical History:  Diagnosis Date   Acute sinusitis, unspecified 12/30/2007   ALLERGIC RHINITIS 12/15/2007   Allergy    ANEMIA-NOS 12/18/2010   ANXIETY 12/15/2007   BLEPHARITIS, RIGHT 04/03/2008   CHEST PAIN 08/25/2010   DEPRESSION 12/15/2007   FATIGUE 08/25/2010   GERD 12/15/2007   Heart murmur    HYPERLIPIDEMIA 07/03/2010   HYPERSOMNIA 12/18/2010   HYPERTENSION 06/27/2007   Irritable bowel syndrome 04/28/2009   ISCHEMIC COLITIS 04/28/2009   Migraines 12/23/2008   Neuromuscular disorder (HCC)    cervical slipped disc 2020    OTITIS MEDIA, BILATERAL 09/12/2008   RECTAL BLEEDING 04/28/2009   Vitamin D  deficiency 03/23/2016    Past Surgical History:  Procedure Laterality Date   BREAST BIOPSY  2005   Negative   COLONOSCOPY     UPPER GASTROINTESTINAL ENDOSCOPY  12/2007    Social History:  reports that she has been smoking cigarettes. She started smoking about 35 years ago. She has a 25.6 pack-year smoking history. She has never used smokeless tobacco. She reports that she does not drink alcohol and does not use drugs.  Allergies: Allergies  Allergen Reactions   Lisinopril  Other (See Comments)    Angioedema left upper and lower lids   Amoxicillin-Pot Clavulanate Other (See Comments)    REACTION: nausea   Clarithromycin Other (See Comments)    REACTION: Nausea   Crestor  [Rosuvastatin ] Other (See Comments)    bilatera shoulder pain   Doxycycline Other (See  Comments)    REACTION: severe fatigue per pt   Lipitor [Atorvastatin  Calcium ] Other (See Comments)    Myalgias joint pain   Lovastatin  Other (See Comments)    Muscle pain    Family History:  Family History  Problem Relation Age of Onset   Breast cancer Mother        age 72-65   Hypertension Mother    Diabetes Mother    Diabetes Paternal Grandmother    Ovarian cancer Other        Aunt   Ovarian cancer Maternal Uncle    Colon cancer Neg Hx    Colon polyps Neg Hx    Esophageal cancer Neg Hx    Rectal cancer Neg Hx    Stomach cancer Neg Hx      Current Outpatient Medications:    amLODipine  (NORVASC ) 10 MG tablet, TAKE 1 TABLET BY MOUTH EVERY DAY, Disp: 90 tablet, Rfl: 1   aspirin  81 MG EC tablet, Take 1 tablet (81 mg total) by mouth daily., Disp: 30 tablet, Rfl: 99   fluticasone  (FLONASE ) 50 MCG/ACT nasal spray, Place 2 sprays into both nostrils daily., Disp: 16 g, Rfl: 6   ibuprofen  (ADVIL ) 800 MG tablet, ibuprofen  800 mg tablet  TAKE 1 TABLET BY MOUTH EVERY 8 HOURS AS NEEDED, Disp: , Rfl:    meloxicam  (MOBIC ) 7.5 MG tablet, Take 1 tablet (7.5 mg total) by mouth daily., Disp: 30 tablet, Rfl: 0   cyclobenzaprine  (FLEXERIL ) 5  MG tablet, Take 1 tablet (5 mg total) by mouth 2 (two) times daily as needed for muscle spasms., Disp: 60 tablet, Rfl: 1   dicyclomine  (BENTYL ) 20 MG tablet, Take 1 tablet (20 mg total) by mouth 3 (three) times daily as needed for spasms. (Patient not taking: Reported on 10/13/2022), Disp: 90 tablet, Rfl: 3   pantoprazole  (PROTONIX ) 40 MG tablet, TAKE 1 TABLET BY MOUTH EVERY DAY, Disp: 90 tablet, Rfl: 0  Review of Systems:  Negative unless indicated in HPI.   Physical Exam: Vitals:   06/26/24 1053  BP: 102/72  Pulse: 80  Temp: 98.3 F (36.8 C)  TempSrc: Oral  SpO2: 99%  Weight: 170 lb 8 oz (77.3 kg)  Height: 5' 2 (1.575 m)    Body mass index is 31.18 kg/m.    Impression and Plan:  Chronic bilateral low back pain without sciatica -      Meloxicam ; Take 1 tablet (7.5 mg total) by mouth daily.  Dispense: 30 tablet; Refill: 0   -Suspect this is simply musculoskeletal pain given lack of red flag signs/symptoms. -Advised icing, as needed NSAIDs, back stretches, local massage therapy. -If no improvement in 3 to 4 weeks, can consider referral to physical therapy and a round of prednisone .   Time spent:30 minutes reviewing chart, interviewing and examining patient and formulating plan of care.     Tully Theophilus Andrews, MD [facility]

## 2024-06-26 NOTE — Progress Notes (Signed)
 SABRA

## 2024-07-24 ENCOUNTER — Encounter: Payer: Self-pay | Admitting: Physician Assistant

## 2024-07-24 ENCOUNTER — Telehealth: Admitting: Physician Assistant

## 2024-07-24 DIAGNOSIS — M549 Dorsalgia, unspecified: Secondary | ICD-10-CM | POA: Diagnosis not present

## 2024-07-24 MED ORDER — CYCLOBENZAPRINE HCL 10 MG PO TABS: 10.0000 mg | ORAL_TABLET | Freq: Three times a day (TID) | ORAL | 0 refills | Status: AC | PRN

## 2024-07-24 NOTE — Progress Notes (Signed)
 E-Visit for Back Pain   We are sorry that you are not feeling well.  Here is how we plan to help!  Based on what you have shared with me it looks like you mostly have acute back pain.  Acute back pain is defined as musculoskeletal pain that can resolve in 1-3 weeks with conservative treatment.  I have prescribed  Flexeril  10 mg every eight hours as needed which is a muscle relaxer. Ok to continue OTC Tylenol and Ibuprofen  for the next couple of days as well.  Some patients experience stomach irritation or in increased heartburn with anti-inflammatory drugs.  Please keep in mind that muscle relaxer's can cause fatigue and should not be taken while at work or driving.  Back pain is very common.  The pain often gets better over time.  The cause of back pain is usually not dangerous.  Most people can learn to manage their back pain on their own.  I have sent a work note to Pharmacologist. You can find by going to the Menu on your homepage, scrolling down to the Communications section, and selecting Letters. Let us  know if you have any issue locating. Take care and feel better soon!   Home Care Stay active.  Start with short walks on flat ground if you can.  Try to walk farther each day. Do not sit, drive or stand in one place for more than 30 minutes.  Do not stay in bed. Do not avoid exercise or work.  Activity can help your back heal faster. Be careful when you bend or lift an object.  Bend at your knees, keep the object close to you, and do not twist. Sleep on a firm mattress.  Lie on your side, and bend your knees.  If you lie on your back, put a pillow under your knees. Only take medicines as told by your doctor. Put ice on the injured area. Put ice in a plastic bag Place a towel between your skin and the bag Leave the ice on for 15-20 minutes, 3-4 times a day for the first 2-3 days. 210 After that, you can switch between ice and heat packs. Ask your doctor about back exercises or  massage. Avoid feeling anxious or stressed.  Find good ways to deal with stress, such as exercise.  Get Help Right Way If: Your pain does not go away with rest or medicine. Your pain does not go away in 1 week. You have new problems. You do not feel well. The pain spreads into your legs. You cannot control when you poop (bowel movement) or pee (urinate) You feel sick to your stomach (nauseous) or throw up (vomit) You have belly (abdominal) pain. You feel like you may pass out (faint). If you develop a fever.  Make Sure you: Understand these instructions. Will watch your condition Will get help right away if you are not doing well or get worse.  Your e-visit answers were reviewed by a board certified advanced clinical practitioner to complete your personal care plan.  Depending on the condition, your plan could have included both over the counter or prescription medications.  If there is a problem please reply  once you have received a response from your provider.  Your safety is important to us .  If you have drug allergies check your prescription carefully.    You can use MyChart to ask questions about today's visit, request a non-urgent call back, or ask for a work or school excuse  for 24 hours related to this e-Visit. If it has been greater than 24 hours you will need to follow up with your provider, or enter a new e-Visit to address those concerns.  You will get an e-mail in the next two days asking about your experience.  I hope that your e-visit has been valuable and will speed your recovery. Thank you for using e-visits.

## 2024-07-24 NOTE — Progress Notes (Signed)
 I have spent 5 minutes in review of e-visit questionnaire, review and updating patient chart, medical decision making and response to patient.   Elsie Velma Lunger, PA-C

## 2024-07-28 ENCOUNTER — Other Ambulatory Visit: Payer: Self-pay | Admitting: Internal Medicine

## 2024-07-28 DIAGNOSIS — G8929 Other chronic pain: Secondary | ICD-10-CM

## 2024-08-20 NOTE — Telephone Encounter (Signed)
 error

## 2024-09-04 ENCOUNTER — Other Ambulatory Visit: Payer: Self-pay | Admitting: Internal Medicine

## 2024-09-05 LAB — HM PAP SMEAR

## 2024-09-17 ENCOUNTER — Encounter: Payer: Self-pay | Admitting: Internal Medicine

## 2024-10-09 ENCOUNTER — Ambulatory Visit (INDEPENDENT_AMBULATORY_CARE_PROVIDER_SITE_OTHER): Admitting: Internal Medicine

## 2024-10-09 ENCOUNTER — Encounter: Payer: Self-pay | Admitting: Internal Medicine

## 2024-10-09 VITALS — BP 148/84 | HR 80 | Temp 98.3°F | Wt 172.0 lb

## 2024-10-09 DIAGNOSIS — E559 Vitamin D deficiency, unspecified: Secondary | ICD-10-CM | POA: Diagnosis not present

## 2024-10-09 DIAGNOSIS — G8929 Other chronic pain: Secondary | ICD-10-CM

## 2024-10-09 DIAGNOSIS — F17209 Nicotine dependence, unspecified, with unspecified nicotine-induced disorders: Secondary | ICD-10-CM

## 2024-10-09 DIAGNOSIS — K219 Gastro-esophageal reflux disease without esophagitis: Secondary | ICD-10-CM

## 2024-10-09 DIAGNOSIS — Z122 Encounter for screening for malignant neoplasm of respiratory organs: Secondary | ICD-10-CM

## 2024-10-09 DIAGNOSIS — I10 Essential (primary) hypertension: Secondary | ICD-10-CM

## 2024-10-09 DIAGNOSIS — Z0100 Encounter for examination of eyes and vision without abnormal findings: Secondary | ICD-10-CM

## 2024-10-09 DIAGNOSIS — E785 Hyperlipidemia, unspecified: Secondary | ICD-10-CM | POA: Diagnosis not present

## 2024-10-09 DIAGNOSIS — Z716 Tobacco abuse counseling: Secondary | ICD-10-CM

## 2024-10-09 DIAGNOSIS — Z0000 Encounter for general adult medical examination without abnormal findings: Secondary | ICD-10-CM

## 2024-10-09 DIAGNOSIS — M545 Low back pain, unspecified: Secondary | ICD-10-CM

## 2024-10-09 DIAGNOSIS — R5383 Other fatigue: Secondary | ICD-10-CM

## 2024-10-09 DIAGNOSIS — F17210 Nicotine dependence, cigarettes, uncomplicated: Secondary | ICD-10-CM

## 2024-10-09 LAB — COMPREHENSIVE METABOLIC PANEL WITH GFR
ALT: 13 U/L (ref 0–35)
AST: 15 U/L (ref 0–37)
Albumin: 4.4 g/dL (ref 3.5–5.2)
Alkaline Phosphatase: 185 U/L — ABNORMAL HIGH (ref 39–117)
BUN: 6 mg/dL (ref 6–23)
CO2: 24 meq/L (ref 19–32)
Calcium: 9.2 mg/dL (ref 8.4–10.5)
Chloride: 102 meq/L (ref 96–112)
Creatinine, Ser: 0.73 mg/dL (ref 0.40–1.20)
GFR: 90.22 mL/min (ref >60.00)
Glucose, Bld: 81 mg/dL (ref 70–99)
Potassium: 4.1 meq/L (ref 3.5–5.1)
Sodium: 138 meq/L (ref 135–145)
Total Bilirubin: 0.4 mg/dL (ref 0.2–1.2)
Total Protein: 7.1 g/dL (ref 6.0–8.3)

## 2024-10-09 LAB — VITAMIN D 25 HYDROXY (VIT D DEFICIENCY, FRACTURES): VITD: 52.07 ng/mL (ref 30.00–100.00)

## 2024-10-09 LAB — TSH: TSH: 2.1 u[IU]/mL (ref 0.35–5.50)

## 2024-10-09 LAB — CBC WITH DIFFERENTIAL/PLATELET
Basophils Absolute: 0.1 K/uL (ref 0.0–0.1)
Basophils Relative: 0.8 % (ref 0.0–3.0)
Eosinophils Absolute: 0 K/uL (ref 0.0–0.7)
Eosinophils Relative: 0.1 % (ref 0.0–5.0)
HCT: 40.7 % (ref 36.0–46.0)
Hemoglobin: 13.6 g/dL (ref 12.0–15.0)
Lymphocytes Relative: 49.9 % — ABNORMAL HIGH (ref 12.0–46.0)
Lymphs Abs: 4 K/uL (ref 0.7–4.0)
MCHC: 33.4 g/dL (ref 30.0–36.0)
MCV: 83.6 fl (ref 78.0–100.0)
Monocytes Absolute: 0.5 K/uL (ref 0.1–1.0)
Monocytes Relative: 5.6 % (ref 3.0–12.0)
Neutro Abs: 3.5 K/uL (ref 1.4–7.7)
Neutrophils Relative %: 43.6 % (ref 43.0–77.0)
Platelets: 252 K/uL (ref 150.0–400.0)
RBC: 4.87 Mil/uL (ref 3.87–5.11)
RDW: 15 % (ref 11.5–15.5)
WBC: 8 K/uL (ref 4.0–10.5)

## 2024-10-09 LAB — LIPID PANEL
Cholesterol: 245 mg/dL — ABNORMAL HIGH (ref 0–200)
HDL: 48.7 mg/dL (ref >39.00)
LDL Cholesterol: 172 mg/dL — ABNORMAL HIGH (ref 0–99)
NonHDL: 195.84
Total CHOL/HDL Ratio: 5
Triglycerides: 120 mg/dL (ref 0.0–149.0)
VLDL: 24 mg/dL (ref 0.0–40.0)

## 2024-10-09 LAB — VITAMIN B12: Vitamin B-12: 391 pg/mL (ref 211–911)

## 2024-10-09 MED ORDER — PANTOPRAZOLE SODIUM 40 MG PO TBEC: 40.0000 mg | DELAYED_RELEASE_TABLET | Freq: Every day | ORAL | 0 refills | Status: AC

## 2024-10-09 MED ORDER — AMLODIPINE BESYLATE 10 MG PO TABS: 10.0000 mg | ORAL_TABLET | Freq: Every day | ORAL | 1 refills | Status: AC

## 2024-10-09 MED ORDER — MELOXICAM 7.5 MG PO TABS: 7.5000 mg | ORAL_TABLET | Freq: Every day | ORAL | 0 refills | Status: AC

## 2024-10-09 NOTE — Progress Notes (Signed)
 Established Patient Office Visit     CC/Reason for Visit: Annual preventive exam  HPI: Sandy Hanna is a 58 y.o. female who is coming in today for the above mentioned reasons. Past Medical History is significant for: Hypertension, hyperlipidemia, GERD, obesity, ongoing nicotine dependence, cervical degenerative disc disease.  Is overdue for an eye exam, has routine dental care.  Will update labs today.  She is due for flu, COVID, pneumonia, shingles vaccine.  She is still a smoker of many years.  She is due for lung cancer screening but all other cancer screenings are up-to-date.   Past Medical/Surgical History: Past Medical History:  Diagnosis Date   Acute sinusitis, unspecified 12/30/2007   ALLERGIC RHINITIS 12/15/2007   Allergy    ANEMIA-NOS 12/18/2010   ANXIETY 12/15/2007   BLEPHARITIS, RIGHT 04/03/2008   CHEST PAIN 08/25/2010   DEPRESSION 12/15/2007   FATIGUE 08/25/2010   GERD 12/15/2007   Heart murmur    HYPERLIPIDEMIA 07/03/2010   HYPERSOMNIA 12/18/2010   HYPERTENSION 06/27/2007   Irritable bowel syndrome 04/28/2009   ISCHEMIC COLITIS 04/28/2009   Migraines 12/23/2008   Neuromuscular disorder (HCC)    cervical slipped disc 2020    OTITIS MEDIA, BILATERAL 09/12/2008   RECTAL BLEEDING 04/28/2009   Vitamin D  deficiency 03/23/2016    Past Surgical History:  Procedure Laterality Date   BREAST BIOPSY  2005   Negative   COLONOSCOPY     UPPER GASTROINTESTINAL ENDOSCOPY  12/2007    Social History:  reports that she has been smoking cigarettes. She started smoking about 35 years ago. She has a 25.6 pack-year smoking history. She has never used smokeless tobacco. She reports that she does not drink alcohol and does not use drugs.  Allergies: Allergies  Allergen Reactions   Lisinopril  Other (See Comments)    Angioedema left upper and lower lids   Amoxicillin-Pot Clavulanate Other (See Comments)    REACTION: nausea   Clarithromycin Other (See Comments)     REACTION: Nausea   Crestor  [Rosuvastatin ] Other (See Comments)    bilatera shoulder pain   Doxycycline Other (See Comments)    REACTION: severe fatigue per pt   Lipitor [Atorvastatin  Calcium ] Other (See Comments)    Myalgias joint pain   Lovastatin  Other (See Comments)    Muscle pain    Family History:  Family History  Problem Relation Age of Onset   Breast cancer Mother        age 62-65   Hypertension Mother    Diabetes Mother    Diabetes Paternal Grandmother    Ovarian cancer Other        Aunt   Ovarian cancer Maternal Uncle    Colon cancer Neg Hx    Colon polyps Neg Hx    Esophageal cancer Neg Hx    Rectal cancer Neg Hx    Stomach cancer Neg Hx      Current Outpatient Medications:    aspirin  81 MG EC tablet, Take 1 tablet (81 mg total) by mouth daily., Disp: 30 tablet, Rfl: 99   cyclobenzaprine  (FLEXERIL ) 10 MG tablet, Take 1 tablet (10 mg total) by mouth 3 (three) times daily as needed., Disp: 15 tablet, Rfl: 0   dicyclomine  (BENTYL ) 20 MG tablet, Take 1 tablet (20 mg total) by mouth 3 (three) times daily as needed for spasms., Disp: 90 tablet, Rfl: 3   fluticasone  (FLONASE ) 50 MCG/ACT nasal spray, Place 2 sprays into both nostrils daily., Disp: 16 g, Rfl: 6  ibuprofen  (ADVIL ) 800 MG tablet, ibuprofen  800 mg tablet  TAKE 1 TABLET BY MOUTH EVERY 8 HOURS AS NEEDED, Disp: , Rfl:    amLODipine  (NORVASC ) 10 MG tablet, Take 1 tablet (10 mg total) by mouth daily., Disp: 90 tablet, Rfl: 1   meloxicam  (MOBIC ) 7.5 MG tablet, Take 1 tablet (7.5 mg total) by mouth daily., Disp: 30 tablet, Rfl: 0   pantoprazole  (PROTONIX ) 40 MG tablet, Take 1 tablet (40 mg total) by mouth daily., Disp: 90 tablet, Rfl: 0  Review of Systems:  Negative unless indicated in HPI.   Physical Exam: Vitals:   10/09/24 1408 10/09/24 1413  BP: (!) 140/76 (!) 148/84  Pulse: 80   Temp: 98.3 F (36.8 C)   TempSrc: Oral   SpO2: 99%   Weight: 172 lb (78 kg)     Body mass index is 31.46  kg/m.   Physical Exam Vitals reviewed.  Constitutional:      General: She is not in acute distress.    Appearance: Normal appearance. She is obese. She is not ill-appearing, toxic-appearing or diaphoretic.  HENT:     Head: Normocephalic.     Right Ear: Tympanic membrane, ear canal and external ear normal. There is no impacted cerumen.     Left Ear: Tympanic membrane, ear canal and external ear normal. There is no impacted cerumen.     Nose: Nose normal.     Mouth/Throat:     Mouth: Mucous membranes are moist.     Pharynx: Oropharynx is clear. No oropharyngeal exudate or posterior oropharyngeal erythema.  Eyes:     General: No scleral icterus.       Right eye: No discharge.        Left eye: No discharge.     Conjunctiva/sclera: Conjunctivae normal.     Pupils: Pupils are equal, round, and reactive to light.  Neck:     Vascular: No carotid bruit.  Cardiovascular:     Rate and Rhythm: Normal rate and regular rhythm.     Pulses: Normal pulses.     Heart sounds: Normal heart sounds.  Pulmonary:     Effort: Pulmonary effort is normal. No respiratory distress.     Breath sounds: Normal breath sounds.  Abdominal:     General: Abdomen is flat. Bowel sounds are normal.     Palpations: Abdomen is soft.  Musculoskeletal:        General: Normal range of motion.     Cervical back: Normal range of motion.  Skin:    General: Skin is warm and dry.  Neurological:     General: No focal deficit present.     Mental Status: She is alert and oriented to person, place, and time. Mental status is at baseline.  Psychiatric:        Mood and Affect: Mood normal.        Behavior: Behavior normal.        Thought Content: Thought content normal.        Judgment: Judgment normal.      Impression and Plan:  Encounter for preventive health examination  Hyperlipidemia, unspecified hyperlipidemia type -     Lipid panel; Future  Vitamin D  deficiency -     VITAMIN D  25 Hydroxy (Vit-D Deficiency,  Fractures); Future  Essential hypertension -     CBC with Differential/Platelet; Future -     Comprehensive metabolic panel with GFR; Future -     TSH; Future -     Vitamin B12; Future -  amLODIPine  Besylate; Take 1 tablet (10 mg total) by mouth daily.  Dispense: 90 tablet; Refill: 1  Fatigue, unspecified type  Screening for lung cancer -     Ambulatory Referral for Lung Cancer Scre  Cigarette nicotine dependence without complication -     Ambulatory Referral for Lung Cancer Scre  Gastroesophageal reflux disease, unspecified whether esophagitis present -     Pantoprazole  Sodium; Take 1 tablet (40 mg total) by mouth daily.  Dispense: 90 tablet; Refill: 0  Chronic bilateral low back pain without sciatica -     Meloxicam ; Take 1 tablet (7.5 mg total) by mouth daily.  Dispense: 30 tablet; Refill: 0  Encounter for vision screening -     Ambulatory referral to Ophthalmology   -Recommend routine eye and dental care. -Healthy lifestyle discussed in detail. -Labs to be updated today. -Prostate cancer screening: Not applicable Health Maintenance  Topic Date Due   Hepatitis B Vaccine (1 of 3 - 19+ 3-dose series) Never done   Screening for Lung Cancer  Never done   Breast Cancer Screening  07/14/2022   Flu Shot  02/19/2025*   Pneumococcal Vaccine for age over 28 (1 of 2 - PCV) 10/09/2025*   DTaP/Tdap/Td vaccine (3 - Td or Tdap) 03/23/2026   Pap with HPV screening  09/06/2027   Colon Cancer Screening  02/02/2029   Hepatitis C Screening  Completed   HIV Screening  Completed   HPV Vaccine  Aged Out   Meningitis B Vaccine  Aged Out   COVID-19 Vaccine  Discontinued   Zoster (Shingles) Vaccine  Discontinued  *Topic was postponed. The date shown is not the original due date.    - Declines all vaccines today despite counseling. - Referral for ophthalmology placed. - Referral for lung cancer screening placed.  -I have discussed tobacco cessation with the patient.  I have counseled  the patient regarding the negative impacts of continued tobacco use including but not limited to lung cancer, COPD, and cardiovascular disease.  I have discussed alternatives to tobacco and modalities that may help facilitate tobacco cessation including but not limited to biofeedback, hypnosis, and medications.  Total time spent with tobacco counseling was 4 minutes.    Tully Theophilus Andrews, MD [facility]

## 2024-10-10 ENCOUNTER — Ambulatory Visit: Payer: Self-pay | Admitting: Internal Medicine

## 2024-10-10 ENCOUNTER — Encounter: Payer: Self-pay | Admitting: Internal Medicine

## 2024-10-10 DIAGNOSIS — E785 Hyperlipidemia, unspecified: Secondary | ICD-10-CM

## 2024-10-10 DIAGNOSIS — I10 Essential (primary) hypertension: Secondary | ICD-10-CM

## 2024-10-10 DIAGNOSIS — E782 Mixed hyperlipidemia: Secondary | ICD-10-CM

## 2024-10-10 DIAGNOSIS — E559 Vitamin D deficiency, unspecified: Secondary | ICD-10-CM

## 2024-10-25 NOTE — Progress Notes (Deleted)
 Medical Nutrition Therapy  Appointment Start time:  ***  Appointment End time:  ***  Primary concerns today: ***  Referral diagnosis: hyperlipidemia, hypertension, Vitamin D  deficiency Preferred learning style: *** (auditory, visual, hands on, no preference indicated) Learning readiness: *** (not ready, contemplating, ready, change in progress)   NUTRITION ASSESSMENT    Clinical Medical Hx: *** Medications: *** Labs:  Lab Results  Component Value Date   CHOL 245 (H) 10/09/2024   HDL 48.70 10/09/2024   LDLCALC 172 (H) 10/09/2024   LDLDIRECT 206.8 06/25/2010   TRIG 120.0 10/09/2024   CHOLHDL 5 10/09/2024   Last vitamin D  Lab Results  Component Value Date   VD25OH 52.07 10/09/2024   Notable Signs/Symptoms:  BP Readings from Last 3 Encounters:  10/09/24 (!) 148/84  06/26/24 102/72  02/03/24 136/66     Lifestyle & Dietary Hx ***  Estimated daily fluid intake: *** oz Supplements: *** Sleep: *** Stress / self-care: *** Current average weekly physical activity: ***  24-Hr Dietary Recall First Meal: *** Snack: *** Second Meal: *** Snack: *** Third Meal: *** Snack: *** Beverages: ***  Estimated Energy Needs Calories: *** Carbohydrate: ***g Protein: ***g Fat: ***g   NUTRITION DIAGNOSIS  {CHL AMB NUTRITIONAL DIAGNOSIS:9495893816}   NUTRITION INTERVENTION  Nutrition education (E-1) on the following topics:  ***  Handouts Provided Include  ***  Learning Style & Readiness for Change Teaching method utilized: Visual & Auditory  Demonstrated degree of understanding via: Teach Back  Barriers to learning/adherence to lifestyle change: ***  Goals Established by Pt ***   MONITORING & EVALUATION Dietary intake, weekly physical activity, and *** in ***.  Next Steps  Patient is to ***.

## 2024-11-08 ENCOUNTER — Encounter: Admitting: Dietician

## 2024-11-08 DIAGNOSIS — E785 Hyperlipidemia, unspecified: Secondary | ICD-10-CM

## 2024-11-08 DIAGNOSIS — I10 Essential (primary) hypertension: Secondary | ICD-10-CM

## 2024-11-08 DIAGNOSIS — E559 Vitamin D deficiency, unspecified: Secondary | ICD-10-CM

## 2024-11-26 NOTE — Progress Notes (Unsigned)
 Medical Nutrition Therapy  Appointment Start time:  ***  Appointment End time:  ***  Primary concerns today: ***  Referral diagnosis: hyperlipidemia, hypertension, Vitamin D  deficiency Preferred learning style: *** (auditory, visual, hands on, no preference indicated) Learning readiness: *** (not ready, contemplating, ready, change in progress)   NUTRITION ASSESSMENT    Clinical Medical Hx: *** Medications: *** Labs:  Lab Results  Component Value Date   CHOL 245 (H) 10/09/2024   HDL 48.70 10/09/2024   LDLCALC 172 (H) 10/09/2024   LDLDIRECT 206.8 06/25/2010   TRIG 120.0 10/09/2024   CHOLHDL 5 10/09/2024   Last vitamin D  Lab Results  Component Value Date   VD25OH 52.07 10/09/2024   Notable Signs/Symptoms:  BP Readings from Last 3 Encounters:  10/09/24 (!) 148/84  06/26/24 102/72  02/03/24 136/66     Lifestyle & Dietary Hx ***  Estimated daily fluid intake: *** oz Supplements: *** Sleep: *** Stress / self-care: *** Current average weekly physical activity: ***  24-Hr Dietary Recall First Meal: *** Snack: *** Second Meal: *** Snack: *** Third Meal: *** Snack: *** Beverages: ***  Estimated Energy Needs Calories: *** Carbohydrate: ***g Protein: ***g Fat: ***g   NUTRITION DIAGNOSIS  {CHL AMB NUTRITIONAL DIAGNOSIS:9495893816}   NUTRITION INTERVENTION  Nutrition education (E-1) on the following topics:  ***  Handouts Provided Include  ***  Learning Style & Readiness for Change Teaching method utilized: Visual & Auditory  Demonstrated degree of understanding via: Teach Back  Barriers to learning/adherence to lifestyle change: ***  Goals Established by Pt ***   MONITORING & EVALUATION Dietary intake, weekly physical activity, and *** in ***.  Next Steps  Patient is to ***.

## 2024-11-28 ENCOUNTER — Encounter: Payer: Self-pay | Admitting: Emergency Medicine

## 2024-11-30 ENCOUNTER — Encounter: Admitting: Dietician

## 2024-11-30 DIAGNOSIS — I10 Essential (primary) hypertension: Secondary | ICD-10-CM

## 2024-11-30 DIAGNOSIS — E785 Hyperlipidemia, unspecified: Secondary | ICD-10-CM

## 2024-11-30 DIAGNOSIS — E559 Vitamin D deficiency, unspecified: Secondary | ICD-10-CM

## 2025-01-14 ENCOUNTER — Other Ambulatory Visit
